# Patient Record
Sex: Female | Born: 1955 | Race: White | Hispanic: No | Smoking: Current every day smoker
Health system: Southern US, Community
[De-identification: ages and names within clinical notes are randomized; demographics above are authoritative.]

## PROBLEM LIST (undated history)

## (undated) DIAGNOSIS — E119 Type 2 diabetes mellitus without complications: Secondary | ICD-10-CM

## (undated) DIAGNOSIS — M899 Disorder of bone, unspecified: Secondary | ICD-10-CM

## (undated) DIAGNOSIS — G939 Disorder of brain, unspecified: Secondary | ICD-10-CM

## (undated) DIAGNOSIS — Q891 Congenital malformations of adrenal gland: Secondary | ICD-10-CM

## (undated) DIAGNOSIS — R12 Heartburn: Secondary | ICD-10-CM

## (undated) DIAGNOSIS — K219 Gastro-esophageal reflux disease without esophagitis: Secondary | ICD-10-CM

## (undated) DIAGNOSIS — C3490 Malignant neoplasm of unspecified part of unspecified bronchus or lung: Secondary | ICD-10-CM

## (undated) HISTORY — DX: Type 2 diabetes mellitus without complications: E11.9

## (undated) HISTORY — PX: TONSILLECTOMY: SUR1361

## (undated) HISTORY — DX: Heartburn: R12

## (undated) HISTORY — PX: ABDOMINAL HYSTERECTOMY: SHX81

## (undated) HISTORY — DX: Gastro-esophageal reflux disease without esophagitis: K21.9

---

## 2005-11-29 ENCOUNTER — Ambulatory Visit: Payer: Self-pay | Admitting: Unknown Physician Specialty

## 2007-06-26 ENCOUNTER — Ambulatory Visit: Payer: Self-pay | Admitting: Unknown Physician Specialty

## 2008-08-12 ENCOUNTER — Ambulatory Visit: Payer: Self-pay | Admitting: Gastroenterology

## 2009-08-12 ENCOUNTER — Ambulatory Visit: Payer: Self-pay | Admitting: General Practice

## 2009-10-04 ENCOUNTER — Ambulatory Visit: Payer: Self-pay | Admitting: Unknown Physician Specialty

## 2011-10-16 ENCOUNTER — Ambulatory Visit: Payer: Self-pay | Admitting: General Practice

## 2012-01-04 ENCOUNTER — Ambulatory Visit: Payer: Self-pay | Admitting: Gastroenterology

## 2012-01-07 LAB — PATHOLOGY REPORT

## 2013-08-26 ENCOUNTER — Encounter: Payer: Self-pay | Admitting: Physician Assistant

## 2013-09-21 ENCOUNTER — Emergency Department: Payer: Self-pay | Admitting: Emergency Medicine

## 2013-09-26 ENCOUNTER — Encounter: Payer: Self-pay | Admitting: Physician Assistant

## 2013-10-27 ENCOUNTER — Encounter: Payer: Self-pay | Admitting: Physician Assistant

## 2013-12-14 ENCOUNTER — Ambulatory Visit: Payer: Self-pay | Admitting: Physician Assistant

## 2015-01-29 ENCOUNTER — Other Ambulatory Visit: Payer: Self-pay | Admitting: Physician Assistant

## 2015-02-02 ENCOUNTER — Other Ambulatory Visit: Payer: Self-pay | Admitting: Emergency Medicine

## 2015-02-02 MED ORDER — OMEPRAZOLE 20 MG PO CPDR: 20.0000 mg | DELAYED_RELEASE_CAPSULE | Freq: Every day | ORAL | Status: DC

## 2015-02-02 NOTE — Telephone Encounter (Signed)
Received a faxed medication request from [facility] in [place].  Please advise.  Thank you.

## 2015-02-02 NOTE — Telephone Encounter (Signed)
Med refill approved 

## 2015-05-10 ENCOUNTER — Ambulatory Visit: Payer: Self-pay | Admitting: Physician Assistant

## 2015-05-10 ENCOUNTER — Encounter: Payer: Self-pay | Admitting: Physician Assistant

## 2015-05-10 VITALS — BP 100/68 | HR 78 | Temp 98.9°F

## 2015-05-10 DIAGNOSIS — J069 Acute upper respiratory infection, unspecified: Secondary | ICD-10-CM

## 2015-05-10 MED ORDER — CEFDINIR 300 MG PO CAPS: 300.0000 mg | ORAL_CAPSULE | Freq: Two times a day (BID) | ORAL | Status: AC

## 2015-05-10 MED ORDER — CEFDINIR 300 MG PO CAPS: 300.0000 mg | ORAL_CAPSULE | Freq: Two times a day (BID) | ORAL | Status: DC

## 2015-05-10 MED ORDER — BENZONATATE 200 MG PO CAPS: 200.0000 mg | ORAL_CAPSULE | Freq: Three times a day (TID) | ORAL | Status: DC | PRN

## 2015-05-10 NOTE — Progress Notes (Signed)
S: C/o runny nose and congestion with congested cough for 10+ days, no fever, chills, cp/sob, v/d; mucus was green this am, cough is sporadic, using delsym during the day, tessalon perls when at home  Using otc meds: robitussin  O: PE: vitals wnl, nad, perrl eomi, normocephalic, tms dull, nasal mucosa red and swollen, throat injected, neck supple no lymph, lungs c t a, cv rrr, neuro intact  A:  Acute  uri   P: omnicef '300mg'$  bid x 10d, tessalon perls, drink fluids, continue regular meds , use otc meds of choice, return if not improving in 5 days, return earlier if worsening

## 2015-09-12 ENCOUNTER — Other Ambulatory Visit: Payer: Self-pay | Admitting: Family

## 2015-09-12 ENCOUNTER — Other Ambulatory Visit: Payer: Self-pay | Admitting: Physician Assistant

## 2015-09-12 MED ORDER — OMEPRAZOLE 20 MG PO CPDR: 20.0000 mg | DELAYED_RELEASE_CAPSULE | Freq: Every day | ORAL | Status: DC

## 2015-09-12 NOTE — Telephone Encounter (Signed)
Needs appointment for labs in office. [place] FNP

## 2015-09-15 ENCOUNTER — Other Ambulatory Visit: Payer: Self-pay | Admitting: Physician Assistant

## 2015-10-12 ENCOUNTER — Other Ambulatory Visit: Payer: Self-pay | Admitting: Family

## 2015-10-21 ENCOUNTER — Other Ambulatory Visit: Payer: Self-pay | Admitting: Physician Assistant

## 2015-10-21 NOTE — Telephone Encounter (Signed)
Med refill approved, discussed with pharmacist and pt is taking '1000mg'$  bid but until last 3 months she had not been taking it correctly, told her we would refill it x 1 but to let the patient know she needs fasting labs prior to next refill

## 2015-10-24 ENCOUNTER — Ambulatory Visit: Payer: Self-pay | Admitting: Physician Assistant

## 2015-10-24 ENCOUNTER — Encounter: Payer: Self-pay | Admitting: Physician Assistant

## 2015-10-24 VITALS — BP 125/100 | HR 80 | Temp 98.6°F

## 2015-10-24 DIAGNOSIS — E119 Type 2 diabetes mellitus without complications: Secondary | ICD-10-CM

## 2015-10-24 MED ORDER — CANAGLIFLOZIN 100 MG PO TABS: 100.0000 mg | ORAL_TABLET | Freq: Every day | ORAL | 3 refills | Status: DC

## 2015-10-24 MED ORDER — FLUCONAZOLE 150 MG PO TABS: ORAL_TABLET | ORAL | 2 refills | Status: DC

## 2015-10-24 NOTE — Progress Notes (Addendum)
S: here for labs, states her glucose has run 300 2 or 3 mornings, has been taking the metformin '1000mg'$  bid, states she finally accepted she has diabetes and has been trying to manage her food and medications better, no other complaints  O: vitals wnl, nad, lungs c t a, cv rrr  A: dmII  P: stop metformin, use invokana '100mg'$  qd, recheck A1C in 2 months, diflucan if needed for yeast, f/u with opth for yearly eye exam  Vit d low, sent rx for vit d to [facility]

## 2015-10-25 LAB — CMP12+LP+TP+TSH+6AC+CBC/D/PLT
ALBUMIN: 4.6 g/dL (ref 3.5–5.5)
ALK PHOS: 128 IU/L — AB (ref 39–117)
ALT: 36 IU/L — AB (ref 0–32)
AST: 21 IU/L (ref 0–40)
Albumin/Globulin Ratio: 2 (ref 1.2–2.2)
BASOS: 0 %
BILIRUBIN TOTAL: 0.3 mg/dL (ref 0.0–1.2)
BUN / CREAT RATIO: 19 (ref 9–23)
BUN: 11 mg/dL (ref 6–24)
Basophils Absolute: 0 10*3/uL (ref 0.0–0.2)
CALCIUM: 9.3 mg/dL (ref 8.7–10.2)
CHLORIDE: 101 mmol/L (ref 96–106)
CHOL/HDL RATIO: 7 ratio — AB (ref 0.0–4.4)
Cholesterol, Total: 230 mg/dL — ABNORMAL HIGH (ref 100–199)
Creatinine, Ser: 0.58 mg/dL (ref 0.57–1.00)
EOS (ABSOLUTE): 0.1 10*3/uL (ref 0.0–0.4)
EOS: 1 %
Estimated CHD Risk: 2 times avg. — ABNORMAL HIGH (ref 0.0–1.0)
FREE THYROXINE INDEX: 2 (ref 1.2–4.9)
GFR calc non Af Amer: 102 mL/min/{1.73_m2} (ref >59)
GFR, EST AFRICAN AMERICAN: 117 mL/min/{1.73_m2} (ref >59)
GGT: 316 IU/L — ABNORMAL HIGH (ref 0–60)
GLOBULIN, TOTAL: 2.3 g/dL (ref 1.5–4.5)
Glucose: 180 mg/dL — ABNORMAL HIGH (ref 65–99)
HDL: 33 mg/dL — ABNORMAL LOW (ref >39)
HEMATOCRIT: 45.2 % (ref 34.0–46.6)
HEMOGLOBIN: 15.1 g/dL (ref 11.1–15.9)
IMMATURE GRANS (ABS): 0 10*3/uL (ref 0.0–0.1)
IMMATURE GRANULOCYTES: 0 %
Iron: 93 ug/dL (ref 27–159)
LDH: 134 IU/L (ref 119–226)
LDL CALC: 150 mg/dL — AB (ref 0–99)
LYMPHS: 26 %
Lymphocytes Absolute: 2.7 10*3/uL (ref 0.7–3.1)
MCH: 29.7 pg (ref 26.6–33.0)
MCHC: 33.4 g/dL (ref 31.5–35.7)
MCV: 89 fL (ref 79–97)
MONOCYTES: 6 %
MONOS ABS: 0.6 10*3/uL (ref 0.1–0.9)
NEUTROS ABS: 6.9 10*3/uL (ref 1.4–7.0)
NEUTROS PCT: 67 %
POTASSIUM: 4.8 mmol/L (ref 3.5–5.2)
Phosphorus: 4 mg/dL (ref 2.5–4.5)
Platelets: 332 10*3/uL (ref 150–379)
RBC: 5.09 x10E6/uL (ref 3.77–5.28)
RDW: 13.9 % (ref 12.3–15.4)
SODIUM: 140 mmol/L (ref 134–144)
T3 Uptake Ratio: 23 % — ABNORMAL LOW (ref 24–39)
T4, Total: 8.8 ug/dL (ref 4.5–12.0)
TRIGLYCERIDES: 236 mg/dL — AB (ref 0–149)
TSH: 2.51 u[IU]/mL (ref 0.450–4.500)
Total Protein: 6.9 g/dL (ref 6.0–8.5)
Uric Acid: 4.4 mg/dL (ref 2.5–7.1)
VLDL CHOLESTEROL CAL: 47 mg/dL — AB (ref 5–40)
WBC: 10.4 10*3/uL (ref 3.4–10.8)

## 2015-10-25 LAB — HGB A1C W/O EAG: Hgb A1c MFr Bld: 8.2 % — ABNORMAL HIGH (ref 4.8–5.6)

## 2015-10-25 LAB — VITAMIN D 25 HYDROXY (VIT D DEFICIENCY, FRACTURES): Vit D, 25-Hydroxy: 23.6 ng/mL — ABNORMAL LOW (ref 30.0–100.0)

## 2015-10-25 MED ORDER — VITAMIN D (ERGOCALCIFEROL) 1.25 MG (50000 UNIT) PO CAPS: 50000.0000 [IU] | ORAL_CAPSULE | ORAL | 4 refills | Status: DC

## 2015-10-25 NOTE — Addendum Note (Signed)
Addended by: Versie Starks on: 10/25/2015 09:30 AM   Modules accepted: Orders

## 2015-10-27 MED ORDER — METFORMIN HCL 1000 MG PO TABS
1000.0000 mg | ORAL_TABLET | Freq: Two times a day (BID) | ORAL | 3 refills | Status: AC
Start: 2015-10-27 — End: ?

## 2015-10-27 MED ORDER — VITAMIN D (ERGOCALCIFEROL) 1.25 MG (50000 UNIT) PO CAPS: 50000.0000 [IU] | ORAL_CAPSULE | ORAL | 4 refills | Status: DC

## 2015-10-27 NOTE — Addendum Note (Signed)
Addended by: Versie Starks on: 10/27/2015 03:46 PM   Modules accepted: Orders

## 2016-01-23 ENCOUNTER — Encounter (INDEPENDENT_AMBULATORY_CARE_PROVIDER_SITE_OTHER): Payer: Self-pay

## 2016-01-23 ENCOUNTER — Other Ambulatory Visit: Payer: Self-pay

## 2016-01-23 DIAGNOSIS — Z299 Encounter for prophylactic measures, unspecified: Secondary | ICD-10-CM

## 2016-01-23 NOTE — Progress Notes (Signed)
Patient came in to have blood drawn for testing per Susan's authorization. 

## 2016-01-24 LAB — CMP12+LP+TP+TSH+6AC+CBC/D/PLT
A/G RATIO: 1.8 (ref 1.2–2.2)
ALT: 19 IU/L (ref 0–32)
AST: 16 IU/L (ref 0–40)
Albumin: 4.4 g/dL (ref 3.5–5.5)
Alkaline Phosphatase: 110 IU/L (ref 39–117)
BASOS ABS: 0.1 10*3/uL (ref 0.0–0.2)
BILIRUBIN TOTAL: 0.2 mg/dL (ref 0.0–1.2)
BUN / CREAT RATIO: 19 (ref 9–23)
BUN: 10 mg/dL (ref 6–24)
Basos: 1 %
CHOL/HDL RATIO: 6.2 ratio — AB (ref 0.0–4.4)
CREATININE: 0.54 mg/dL — AB (ref 0.57–1.00)
Calcium: 9.4 mg/dL (ref 8.7–10.2)
Chloride: 102 mmol/L (ref 96–106)
Cholesterol, Total: 222 mg/dL — ABNORMAL HIGH (ref 100–199)
EOS (ABSOLUTE): 0.1 10*3/uL (ref 0.0–0.4)
EOS: 1 %
Estimated CHD Risk: 1.8 times avg. — ABNORMAL HIGH (ref 0.0–1.0)
Free Thyroxine Index: 2 (ref 1.2–4.9)
GFR calc non Af Amer: 104 mL/min/{1.73_m2} (ref >59)
GFR, EST AFRICAN AMERICAN: 120 mL/min/{1.73_m2} (ref >59)
GGT: 176 IU/L — ABNORMAL HIGH (ref 0–60)
GLUCOSE: 140 mg/dL — AB (ref 65–99)
Globulin, Total: 2.4 g/dL (ref 1.5–4.5)
HDL: 36 mg/dL — AB (ref >39)
HEMOGLOBIN: 15.4 g/dL (ref 11.1–15.9)
Hematocrit: 45.8 % (ref 34.0–46.6)
Immature Grans (Abs): 0 10*3/uL (ref 0.0–0.1)
Immature Granulocytes: 0 %
Iron: 72 ug/dL (ref 27–159)
LDH: 169 IU/L (ref 119–226)
LDL Calculated: 139 mg/dL — ABNORMAL HIGH (ref 0–99)
Lymphocytes Absolute: 3.1 10*3/uL (ref 0.7–3.1)
Lymphs: 27 %
MCH: 30.4 pg (ref 26.6–33.0)
MCHC: 33.6 g/dL (ref 31.5–35.7)
MCV: 90 fL (ref 79–97)
MONOCYTES: 7 %
Monocytes Absolute: 0.8 10*3/uL (ref 0.1–0.9)
NEUTROS ABS: 7.4 10*3/uL — AB (ref 1.4–7.0)
Neutrophils: 64 %
PHOSPHORUS: 3.7 mg/dL (ref 2.5–4.5)
PLATELETS: 324 10*3/uL (ref 150–379)
Potassium: 4.8 mmol/L (ref 3.5–5.2)
RBC: 5.07 x10E6/uL (ref 3.77–5.28)
RDW: 13.8 % (ref 12.3–15.4)
Sodium: 142 mmol/L (ref 134–144)
T3 Uptake Ratio: 24 % (ref 24–39)
T4, Total: 8.4 ug/dL (ref 4.5–12.0)
TSH: 2.41 u[IU]/mL (ref 0.450–4.500)
Total Protein: 6.8 g/dL (ref 6.0–8.5)
Triglycerides: 237 mg/dL — ABNORMAL HIGH (ref 0–149)
URIC ACID: 3.9 mg/dL (ref 2.5–7.1)
VLDL CHOLESTEROL CAL: 47 mg/dL — AB (ref 5–40)
WBC: 11.5 10*3/uL — ABNORMAL HIGH (ref 3.4–10.8)

## 2016-01-24 LAB — HGB A1C W/O EAG: HEMOGLOBIN A1C: 7 % — AB (ref 4.8–5.6)

## 2016-01-30 ENCOUNTER — Encounter: Payer: Self-pay | Admitting: Emergency Medicine

## 2016-01-30 DIAGNOSIS — E119 Type 2 diabetes mellitus without complications: Secondary | ICD-10-CM | POA: Insufficient documentation

## 2016-03-06 ENCOUNTER — Other Ambulatory Visit: Payer: Self-pay

## 2016-03-06 DIAGNOSIS — Z299 Encounter for prophylactic measures, unspecified: Secondary | ICD-10-CM

## 2016-03-06 MED ORDER — CANAGLIFLOZIN 100 MG PO TABS: 100.0000 mg | ORAL_TABLET | Freq: Every day | ORAL | 3 refills | Status: DC

## 2016-03-06 NOTE — Addendum Note (Signed)
Addended by: Versie Starks on: 03/06/2016 04:28 PM   Modules accepted: Orders

## 2016-03-06 NOTE — Progress Notes (Signed)
Patient came in to have blood drawn for testing per Susan's authorization. 

## 2016-03-07 ENCOUNTER — Encounter: Payer: Self-pay | Admitting: Physician Assistant

## 2016-03-07 LAB — COMPREHENSIVE METABOLIC PANEL
A/G RATIO: 2 (ref 1.2–2.2)
ALT: 16 IU/L (ref 0–32)
AST: 9 IU/L (ref 0–40)
Albumin: 4.5 g/dL (ref 3.5–5.5)
Alkaline Phosphatase: 109 IU/L (ref 39–117)
BUN/Creatinine Ratio: 24 — ABNORMAL HIGH (ref 9–23)
BUN: 21 mg/dL (ref 6–24)
CHLORIDE: 101 mmol/L (ref 96–106)
CO2: 23 mmol/L (ref 18–29)
Calcium: 9.5 mg/dL (ref 8.7–10.2)
Creatinine, Ser: 0.88 mg/dL (ref 0.57–1.00)
GFR calc non Af Amer: 73 mL/min/{1.73_m2} (ref >59)
GFR, EST AFRICAN AMERICAN: 84 mL/min/{1.73_m2} (ref >59)
Globulin, Total: 2.2 g/dL (ref 1.5–4.5)
Glucose: 218 mg/dL — ABNORMAL HIGH (ref 65–99)
POTASSIUM: 4.4 mmol/L (ref 3.5–5.2)
Sodium: 142 mmol/L (ref 134–144)
TOTAL PROTEIN: 6.7 g/dL (ref 6.0–8.5)

## 2016-03-07 NOTE — Addendum Note (Signed)
Addended by: Rudene Anda T on: 03/07/2016 02:08 PM   Modules accepted: Orders

## 2016-03-19 ENCOUNTER — Other Ambulatory Visit: Payer: Self-pay

## 2016-04-04 ENCOUNTER — Encounter: Payer: Self-pay | Admitting: Radiology

## 2016-04-04 ENCOUNTER — Ambulatory Visit
Admission: RE | Admit: 2016-04-04 | Discharge: 2016-04-04 | Disposition: A | Discharge status: Home / Self Care | Payer: Managed Care, Other (non HMO) | Source: Ambulatory Visit | Attending: Physician Assistant | Admitting: Physician Assistant

## 2016-04-04 DIAGNOSIS — Z1231 Encounter for screening mammogram for malignant neoplasm of breast: Secondary | ICD-10-CM | POA: Insufficient documentation

## 2016-04-04 DIAGNOSIS — Z299 Encounter for prophylactic measures, unspecified: Secondary | ICD-10-CM | POA: Insufficient documentation

## 2016-04-19 ENCOUNTER — Ambulatory Visit: Payer: Self-pay | Admitting: Physician Assistant

## 2016-04-19 ENCOUNTER — Encounter: Payer: Self-pay | Admitting: Physician Assistant

## 2016-04-19 VITALS — BP 130/70 | HR 88 | Temp 98.2°F | Ht 63.0 in | Wt 194.0 lb

## 2016-04-19 DIAGNOSIS — Z008 Encounter for other general examination: Secondary | ICD-10-CM

## 2016-04-19 DIAGNOSIS — Z0000 Encounter for general adult medical examination without abnormal findings: Secondary | ICD-10-CM

## 2016-04-19 DIAGNOSIS — Z0189 Encounter for other specified special examinations: Secondary | ICD-10-CM

## 2016-04-19 NOTE — Progress Notes (Signed)
S: here for wellness and biometric eval, had a mammogram this year which was normal, gets a colonoscopy every 2 years, hx dm, gerd; +smoker 1ppd for 33y, not ready to quit; married; Fam Hx, mother died at 56 in her sleep, hx of breast CA, dm, htn; F is 71, hx of htn and afib; sister has dm, htn, brother has dm, not sure of his other health problems, concerned as the women on her mother's side usually die of heart disease in their 14s; still has not seen the endocrinologist that we have set an appointment up with several times, did see the eye doctor this year, has no complaints today, ros negative  O: vitals wnl nad, ENT wnl neck supple no lymph noted, lungs c t a, cv rrr, abd soft nontender bs normal all 4 quads, n/v intact, baseline ekg obtained today, nsr  A: wellness encounter, biometric forms, dm, gerd, smoker  P: encouraged smoking cessation, f/u with endocrinologist, once again explained to pt that we do not feel comfortable managing her dm since it has progressed, states she understands and will go to the endocrinologist, forms filled out for biometric

## 2016-04-19 NOTE — Addendum Note (Signed)
Addended by: Versie Starks on: 04/19/2016 03:40 PM   Modules accepted: Orders

## 2016-06-01 ENCOUNTER — Other Ambulatory Visit: Payer: Self-pay | Admitting: Emergency Medicine

## 2016-06-01 MED ORDER — CANAGLIFLOZIN 100 MG PO TABS: 100.0000 mg | ORAL_TABLET | Freq: Every day | ORAL | 0 refills | Status: DC

## 2016-06-04 ENCOUNTER — Other Ambulatory Visit: Payer: Self-pay | Admitting: Physician Assistant

## 2016-06-04 NOTE — Telephone Encounter (Signed)
Med refill for omeprazole approved

## 2016-07-03 ENCOUNTER — Other Ambulatory Visit: Payer: Self-pay | Admitting: Physician Assistant

## 2016-07-12 ENCOUNTER — Encounter: Payer: Self-pay | Admitting: Physician Assistant

## 2016-07-12 ENCOUNTER — Ambulatory Visit: Payer: Self-pay | Admitting: Physician Assistant

## 2016-07-12 VITALS — BP 120/70 | HR 81 | Temp 98.2°F

## 2016-07-12 DIAGNOSIS — N951 Menopausal and female climacteric states: Secondary | ICD-10-CM

## 2016-07-12 DIAGNOSIS — W57XXXA Bitten or stung by nonvenomous insect and other nonvenomous arthropods, initial encounter: Secondary | ICD-10-CM

## 2016-07-12 DIAGNOSIS — M545 Low back pain, unspecified: Secondary | ICD-10-CM

## 2016-07-12 MED ORDER — DOXYCYCLINE HYCLATE 100 MG PO TABS: 100.0000 mg | ORAL_TABLET | Freq: Two times a day (BID) | ORAL | 0 refills | Status: DC

## 2016-07-12 MED ORDER — VENLAFAXINE HCL ER 37.5 MG PO CP24: ORAL_CAPSULE | ORAL | 3 refills | Status: DC

## 2016-07-12 MED ORDER — BACLOFEN 10 MG PO TABS: 10.0000 mg | ORAL_TABLET | Freq: Three times a day (TID) | ORAL | 0 refills | Status: DC

## 2016-07-12 NOTE — Progress Notes (Signed)
S: c/o low back pain, ?if uti, states she has been sitting more than usual, no numbness or tingling, also states she has a tick bite on the back of her leg, can't see it so doesn't know if its red/swollen, no fever/chills, also ?if there is medication other than hormones that will help with hot flashes  O: vitals wnl, nad, spine is nontender, tender at si joints and muscles feel tight, neg slr, full rom, back of r leg has red raised area at site of tick bite with redness surrounding to size of quarter, no pustules or drainage, n/v intact  A: tick bite, acute back pain, hot flashes  P: doxy, baclofen, effexor 37.'5mg'$ 

## 2016-07-13 ENCOUNTER — Other Ambulatory Visit: Payer: Self-pay

## 2016-07-13 DIAGNOSIS — Z299 Encounter for prophylactic measures, unspecified: Secondary | ICD-10-CM

## 2016-07-13 LAB — POCT URINALYSIS DIPSTICK
Bilirubin, UA: NEGATIVE
KETONES UA: NEGATIVE
LEUKOCYTES UA: NEGATIVE
NITRITE UA: NEGATIVE
PROTEIN UA: NEGATIVE
Spec Grav, UA: 1.01 (ref 1.010–1.025)
Urobilinogen, UA: 0.2 E.U./dL
pH, UA: 5.5 (ref 5.0–8.0)

## 2016-07-13 NOTE — Addendum Note (Signed)
Addended by: Rudene Anda T on: 07/13/2016 01:13 PM   Modules accepted: Orders

## 2016-07-13 NOTE — Progress Notes (Signed)
Patient came in to have blood drawn for testing per Susan's authorization. 

## 2016-07-14 LAB — COMPREHENSIVE METABOLIC PANEL
ALBUMIN: 4.6 g/dL (ref 3.5–5.5)
ALK PHOS: 103 IU/L (ref 39–117)
ALT: 16 IU/L (ref 0–32)
AST: 14 IU/L (ref 0–40)
Albumin/Globulin Ratio: 2.1 (ref 1.2–2.2)
BUN / CREAT RATIO: 24 — AB (ref 9–23)
BUN: 14 mg/dL (ref 6–24)
Bilirubin Total: 0.3 mg/dL (ref 0.0–1.2)
CO2: 21 mmol/L (ref 18–29)
CREATININE: 0.58 mg/dL (ref 0.57–1.00)
Calcium: 9.6 mg/dL (ref 8.7–10.2)
Chloride: 98 mmol/L (ref 96–106)
GFR calc Af Amer: 117 mL/min/{1.73_m2} (ref >59)
GFR calc non Af Amer: 102 mL/min/{1.73_m2} (ref >59)
GLUCOSE: 143 mg/dL — AB (ref 65–99)
Globulin, Total: 2.2 g/dL (ref 1.5–4.5)
Potassium: 4.7 mmol/L (ref 3.5–5.2)
Sodium: 138 mmol/L (ref 134–144)
Total Protein: 6.8 g/dL (ref 6.0–8.5)

## 2016-07-14 LAB — HGB A1C W/O EAG: Hgb A1c MFr Bld: 7.5 % — ABNORMAL HIGH (ref 4.8–5.6)

## 2016-07-19 ENCOUNTER — Other Ambulatory Visit: Payer: Self-pay | Admitting: Physician Assistant

## 2016-07-27 ENCOUNTER — Other Ambulatory Visit: Payer: Self-pay | Admitting: Physician Assistant

## 2016-07-27 NOTE — Telephone Encounter (Signed)
One refill of invokana approved, pt has not been called by [facility] for appt, rma called their office and they said dr Eddie Dibbles was not taking new patients so she would have to see dr Gabriel Carina, they will call her with an appointment time either today or Monday; pt has previously cancelled many appointments with specialists, I will approve only 1 refill of invokana bc I feel its vital for her to see an endocrinologist due to the worsening diabetes

## 2016-08-23 ENCOUNTER — Other Ambulatory Visit: Payer: Self-pay | Admitting: Emergency Medicine

## 2016-08-23 ENCOUNTER — Other Ambulatory Visit: Payer: Self-pay | Admitting: Physician Assistant

## 2016-08-23 MED ORDER — CANAGLIFLOZIN 100 MG PO TABS: ORAL_TABLET | ORAL | 1 refills | Status: DC

## 2016-08-23 NOTE — Telephone Encounter (Signed)
This will be the last refill of invokana for this patient.  She has been told numerous times to see an endocrinologist.  She has an appt on Aug 6 and we told her we expect her to continue to see the endocrinologist and get her medications from them

## 2016-09-06 ENCOUNTER — Other Ambulatory Visit: Payer: Self-pay | Admitting: Emergency Medicine

## 2016-09-06 MED ORDER — FLUCONAZOLE 150 MG PO TABS: ORAL_TABLET | ORAL | 2 refills | Status: DC

## 2016-09-06 NOTE — Telephone Encounter (Signed)
Pt having yeast due to invokana, ?if could get rx for diflucan, sent to [facility]

## 2016-10-12 ENCOUNTER — Telehealth: Payer: Self-pay | Admitting: Emergency Medicine

## 2016-10-12 NOTE — Telephone Encounter (Signed)
Patient called and requested that I send a copy of her medical records sent to Dr. Janene Leonard at [facility].  I informed her that I would need her to sign a release form for me to send the records.  I emailed her a release form and she faxed a signed copy back. Records were sent and confirmed receipt.  Patient also expressed that she needs to get a refill on her Invokana until her appointment with Dr. Janene Leonard.  I informed Sandra Leonard and she expressed that she is going to deny the request.  She has told the patient numerous times dating back almost a year to follow-up with an Endocrinologist to care for her Diabetes because her HgA1C numbers were too high to manage in our clinic.  Numerous appointments were made for the patient to see an Endocrinologist and the patient had cancelled the appointments. I attempted to call the patient but received no answer.  An secured e-mail was sent to the patient.

## 2016-11-16 ENCOUNTER — Encounter: Payer: Self-pay | Admitting: Physician Assistant

## 2016-11-16 ENCOUNTER — Ambulatory Visit: Payer: Self-pay | Admitting: Physician Assistant

## 2016-11-16 VITALS — BP 107/65 | HR 87 | Temp 98.7°F | Resp 16

## 2016-11-16 DIAGNOSIS — J209 Acute bronchitis, unspecified: Secondary | ICD-10-CM

## 2016-11-16 MED ORDER — IPRATROPIUM-ALBUTEROL 0.5-2.5 (3) MG/3ML IN SOLN
3.0000 mL | Freq: Once | RESPIRATORY_TRACT | Status: AC
  Administered 2016-11-16: 3 mL via RESPIRATORY_TRACT

## 2016-11-16 MED ORDER — LEVOFLOXACIN 500 MG PO TABS: 500.0000 mg | ORAL_TABLET | Freq: Every day | ORAL | 0 refills | Status: DC

## 2016-11-16 MED ORDER — ALBUTEROL SULFATE HFA 108 (90 BASE) MCG/ACT IN AERS: 2.0000 | INHALATION_SPRAY | Freq: Four times a day (QID) | RESPIRATORY_TRACT | 0 refills | Status: DC | PRN

## 2016-11-16 NOTE — Progress Notes (Signed)
S: C/o cough and congestion with wheezing and chest pain, chest is sore from coughing, denies fever, chills, states mucus is yellow,  But cough is mostly dry and hacking; keeping pt awake at night;  denies cardiac type chest pain or sob, v/d, abd pain Remainder ros neg  O: vitals wnl, nad, tms clear, throat injected, neck supple no lymph, lungs with wheezing/rhonchi in lower lungs, cv rrr, neuro intact, svn duoneb given, pt has better air movement after svn  A:  Acute bronchitis   P:  rx medication:  Levaquin, albuterol; smoking cessation, use otc meds, tylenol or motrin as needed for fever/chills, return if not better in 3 -5 days, return earlier if worsening

## 2016-11-20 DIAGNOSIS — Z720 Tobacco use: Secondary | ICD-10-CM | POA: Insufficient documentation

## 2016-11-20 DIAGNOSIS — K219 Gastro-esophageal reflux disease without esophagitis: Secondary | ICD-10-CM | POA: Insufficient documentation

## 2017-01-16 ENCOUNTER — Encounter: Payer: Self-pay | Admitting: Physician Assistant

## 2017-01-16 ENCOUNTER — Ambulatory Visit: Payer: Self-pay | Admitting: Physician Assistant

## 2017-01-16 VITALS — BP 120/70 | HR 100 | Temp 98.5°F | Resp 16

## 2017-01-16 DIAGNOSIS — L659 Nonscarring hair loss, unspecified: Secondary | ICD-10-CM

## 2017-01-16 DIAGNOSIS — J0100 Acute maxillary sinusitis, unspecified: Secondary | ICD-10-CM

## 2017-01-16 MED ORDER — DOXYCYCLINE HYCLATE 100 MG PO TABS: 100.0000 mg | ORAL_TABLET | Freq: Two times a day (BID) | ORAL | 0 refills | Status: DC

## 2017-01-16 MED ORDER — FLUCONAZOLE 150 MG PO TABS: ORAL_TABLET | ORAL | 2 refills | Status: DC

## 2017-01-16 MED ORDER — PREDNISONE 10 MG PO TABS: 30.0000 mg | ORAL_TABLET | Freq: Every day | ORAL | 0 refills | Status: DC

## 2017-01-16 NOTE — Progress Notes (Signed)
S: Complains of sinus congestion, pressure across her face, cervical glasses to sit on the upper maxillary area, denies fever or chills denies cough, denies chest pain or shortness of breath, also states that she's been having some hair loss and is worried about her thyroid level as her sister has thyroid problems  O: Vitals are normal, TMs are clear, nasal mucosa is grossly red and swollen, maxillary sinuses are tender to palpation, throat is normal, neck is supple, no lymphadenopathy noted, lungs are clear to auscultation, heart sounds are normal, patient still has good amount of hair  A: Acute sinusitis, hair loss  P: Doxy 100 mg twice a day for 10 days, prednisone 30 mg daily for 3 days, estrogen level and thyroid panel ordered

## 2017-01-19 LAB — THYROID PANEL WITH TSH
Free Thyroxine Index: 2.1 (ref 1.2–4.9)
T3 UPTAKE RATIO: 24 % (ref 24–39)
T4, Total: 8.7 ug/dL (ref 4.5–12.0)
TSH: 2.25 u[IU]/mL (ref 0.450–4.500)

## 2017-01-19 LAB — ESTROGENS, TOTAL: ESTROGEN: 151 pg/mL

## 2017-01-23 ENCOUNTER — Other Ambulatory Visit: Payer: Self-pay | Admitting: Physician Assistant

## 2017-01-28 MED ORDER — CANAGLIFLOZIN 100 MG PO TABS: 100.0000 mg | ORAL_TABLET | Freq: Every day | ORAL | 0 refills | Status: DC

## 2017-02-28 ENCOUNTER — Other Ambulatory Visit: Payer: Self-pay

## 2017-02-28 DIAGNOSIS — Z299 Encounter for prophylactic measures, unspecified: Secondary | ICD-10-CM

## 2017-02-28 NOTE — Progress Notes (Signed)
Patient came in to have blood drawn for testing per Dr. Alvan Dame orders.

## 2017-03-01 LAB — CMP12+LP+TP+TSH+6AC+CBC/D/PLT
A/G RATIO: 2 (ref 1.2–2.2)
ALBUMIN: 4.7 g/dL (ref 3.5–5.5)
ALT: 14 IU/L (ref 0–32)
AST: 13 IU/L (ref 0–40)
Alkaline Phosphatase: 112 IU/L (ref 39–117)
BASOS ABS: 0.1 10*3/uL (ref 0.0–0.2)
BUN/Creatinine Ratio: 35 — ABNORMAL HIGH (ref 9–23)
BUN: 18 mg/dL (ref 6–24)
Basos: 0 %
Bilirubin Total: 0.2 mg/dL (ref 0.0–1.2)
CHOLESTEROL TOTAL: 225 mg/dL — AB (ref 100–199)
Calcium: 10 mg/dL (ref 8.7–10.2)
Chloride: 98 mmol/L (ref 96–106)
Chol/HDL Ratio: 6.6 ratio — ABNORMAL HIGH (ref 0.0–4.4)
Creatinine, Ser: 0.51 mg/dL — ABNORMAL LOW (ref 0.57–1.00)
EOS (ABSOLUTE): 0.1 10*3/uL (ref 0.0–0.4)
Eos: 1 %
Estimated CHD Risk: 1.9 times avg. — ABNORMAL HIGH (ref 0.0–1.0)
Free Thyroxine Index: 2.3 (ref 1.2–4.9)
GFR, EST AFRICAN AMERICAN: 122 mL/min/{1.73_m2} (ref >59)
GFR, EST NON AFRICAN AMERICAN: 106 mL/min/{1.73_m2} (ref >59)
GGT: 145 IU/L — ABNORMAL HIGH (ref 0–60)
GLOBULIN, TOTAL: 2.4 g/dL (ref 1.5–4.5)
Glucose: 123 mg/dL — ABNORMAL HIGH (ref 65–99)
HDL: 34 mg/dL — AB (ref >39)
Hematocrit: 47.7 % — ABNORMAL HIGH (ref 34.0–46.6)
Hemoglobin: 16.3 g/dL — ABNORMAL HIGH (ref 11.1–15.9)
IRON: 86 ug/dL (ref 27–159)
Immature Grans (Abs): 0 10*3/uL (ref 0.0–0.1)
Immature Granulocytes: 0 %
LDH: 156 IU/L (ref 119–226)
LDL Calculated: 139 mg/dL — ABNORMAL HIGH (ref 0–99)
Lymphocytes Absolute: 3.5 10*3/uL — ABNORMAL HIGH (ref 0.7–3.1)
Lymphs: 28 %
MCH: 31 pg (ref 26.6–33.0)
MCHC: 34.2 g/dL (ref 31.5–35.7)
MCV: 91 fL (ref 79–97)
MONOCYTES: 8 %
Monocytes Absolute: 1 10*3/uL — ABNORMAL HIGH (ref 0.1–0.9)
Neutrophils Absolute: 7.9 10*3/uL — ABNORMAL HIGH (ref 1.4–7.0)
Neutrophils: 63 %
PHOSPHORUS: 3.7 mg/dL (ref 2.5–4.5)
PLATELETS: 337 10*3/uL (ref 150–379)
Potassium: 4.9 mmol/L (ref 3.5–5.2)
RBC: 5.25 x10E6/uL (ref 3.77–5.28)
RDW: 13.7 % (ref 12.3–15.4)
Sodium: 137 mmol/L (ref 134–144)
T3 UPTAKE RATIO: 25 % (ref 24–39)
T4 TOTAL: 9 ug/dL (ref 4.5–12.0)
TOTAL PROTEIN: 7.1 g/dL (ref 6.0–8.5)
TRIGLYCERIDES: 262 mg/dL — AB (ref 0–149)
TSH: 2.47 u[IU]/mL (ref 0.450–4.500)
Uric Acid: 3.8 mg/dL (ref 2.5–7.1)
VLDL Cholesterol Cal: 52 mg/dL — ABNORMAL HIGH (ref 5–40)
WBC: 12.6 10*3/uL — AB (ref 3.4–10.8)

## 2017-03-01 LAB — HEPATITIS C ANTIBODY (REFLEX): HCV Ab: <0.1 s/co ratio (ref 0.0–0.9)

## 2017-03-01 LAB — HCV COMMENT:

## 2017-03-06 LAB — SPECIMEN STATUS REPORT

## 2017-03-06 LAB — HGB A1C W/O EAG: HEMOGLOBIN A1C: 6.8 % — AB (ref 4.8–5.6)

## 2017-04-10 ENCOUNTER — Ambulatory Visit: Payer: Self-pay | Admitting: Physician Assistant

## 2017-04-10 ENCOUNTER — Encounter: Payer: Self-pay | Admitting: Physician Assistant

## 2017-04-10 VITALS — BP 153/96 | HR 105 | Temp 98.0°F

## 2017-04-10 DIAGNOSIS — J0120 Acute ethmoidal sinusitis, unspecified: Secondary | ICD-10-CM

## 2017-04-10 MED ORDER — BENZONATATE 200 MG PO CAPS: 200.0000 mg | ORAL_CAPSULE | Freq: Three times a day (TID) | ORAL | 0 refills | Status: DC | PRN

## 2017-04-10 MED ORDER — CLARITHROMYCIN 500 MG PO TABS: 500.0000 mg | ORAL_TABLET | Freq: Two times a day (BID) | ORAL | 0 refills | Status: DC

## 2017-04-10 MED ORDER — FEXOFENADINE-PSEUDOEPHED ER 60-120 MG PO TB12: 1.0000 | ORAL_TABLET | Freq: Two times a day (BID) | ORAL | 0 refills | Status: DC

## 2017-04-10 NOTE — Progress Notes (Signed)
   Subjective: Sinus congestion    Patient ID: Sandra Leonard, female    DOB: September 10, 1955, 62 y.o.   MRN: [id number]  HPI Patient complaining 1 week of frontal headache, nasal congestion, right ear pressure, and cough with deep inspirations.  Patient denies nausea, vomiting, diarrhea.  Patient is taking flu shot for this season.  Patient stated no relief over-the-counter cough preparations.   Review of Systems    Diabetes Objective:   Physical Exam HEENT remarkable bilateral maxillary guarding.  Edematous nasal turbinates with thick rhinorrhea.  Postnasal drainage.  Neck is supple without adenopathy.  Lungs are clear to auscultation.  Heart is regular rate and rhythm.       Assessment & Plan: Sinusitis  Patient given a prescription for Biaxin, Allegra-D, and Tessalon Perles.  Patient advised to follow-up PCP if no improvement in 1 week.

## 2017-06-18 ENCOUNTER — Ambulatory Visit: Payer: Self-pay | Admitting: Family Medicine

## 2017-06-18 VITALS — BP 102/63 | HR 83 | Temp 98.6°F | Resp 18 | Ht 63.0 in | Wt 187.0 lb

## 2017-06-18 DIAGNOSIS — Z008 Encounter for other general examination: Secondary | ICD-10-CM

## 2017-06-18 DIAGNOSIS — Z0189 Encounter for other specified special examinations: Principal | ICD-10-CM

## 2017-06-18 LAB — GLUCOSE, POCT (MANUAL RESULT ENTRY): POC GLUCOSE: 144 mg/dL — AB (ref 70–99)

## 2017-06-18 NOTE — Progress Notes (Signed)
Subjective: Annual biometrics screening  Patient presents for her annual biometric screening. Patient has a history of hyperlipidemia and type 2 diabetes.  Patient reports improved diet over the last year and 20 pound weight loss.  Patient reports that she walks and gardens regularly for exercise.  Patient regularly sees her primary care provider at [facility] primary [facility]. PCP: Dr. Wallie Char? Patient denies any other issues or concerns.   Review of Systems Unremarkable  Objective  Physical Exam General: Awake, alert and oriented. No acute distress. Well developed, hydrated and nourished. Appears stated age.  HEENT: Supple neck without adenopathy. Sclera is non-icteric. The ear canal is clear without discharge. The tympanic membrane is normal in appearance with normal landmarks and cone of light. Nasal mucosa is pink and moist. Oral mucosa is pink and moist. The pharynx is normal in appearance without tonsillar swelling or exudates.  Skin: Skin in warm, dry and intact without rashes or lesions. Appropriate color for ethnicity. Cardiac: Heart rate and rhythm are normal. No murmurs, gallops, or rubs are auscultated.  Respiratory: The chest wall is symmetric and without deformity. No signs of respiratory distress. Lung sounds are clear in all lobes bilaterally without rales, ronchi, or wheezes.  Neurological: The patient is awake, alert and oriented to person, place, and time with normal speech.  Memory is normal and thought processes intact. No gait abnormalities are appreciated.  Psychiatric: Appropriate mood and affect.   Assessment Annual biometrics screening  Plan  Lipid panel pending. Encouraged routine visits with primary care provider.  Fasting blood sugar is 144 today.  History of type 2 diabetes.  Patient reports compliance with medications.  Advised patient to follow-up with primary care provider regarding this.

## 2017-06-19 LAB — LIPID PANEL
CHOLESTEROL TOTAL: 175 mg/dL (ref 100–199)
Chol/HDL Ratio: 5 ratio — ABNORMAL HIGH (ref 0.0–4.4)
HDL: 35 mg/dL — ABNORMAL LOW (ref >39)
LDL Calculated: 109 mg/dL — ABNORMAL HIGH (ref 0–99)
TRIGLYCERIDES: 155 mg/dL — AB (ref 0–149)
VLDL Cholesterol Cal: 31 mg/dL (ref 5–40)

## 2017-06-19 NOTE — Progress Notes (Signed)
Dear Sandra Leonard, I wanted to let you know that your lipid panel came back.  Your total cholesterol and VLDL cholesterol are normal. Your triglyceride level is 155, normal values are between 0 and 149. Your HDL cholesterol ("good cholesterol") is 35, normal values are above 39.  Your LDL cholesterol ("bad cholesterol") is 109, normal values are below 99.  Your cholesterol/HDL ratio is 5.0, normal values are between 0 and 4.4 for women or 0 and 5 from men.  These abnormal results increase your risk for cardiovascular disease but these values have improved from last year.  I wanted you to be aware of these results so that you can discuss this with your primary care provider at your next visit.

## 2017-06-24 ENCOUNTER — Encounter: Payer: Self-pay | Admitting: Family Medicine

## 2017-07-11 DIAGNOSIS — K635 Polyp of colon: Secondary | ICD-10-CM | POA: Insufficient documentation

## 2017-07-11 DIAGNOSIS — E782 Mixed hyperlipidemia: Secondary | ICD-10-CM | POA: Insufficient documentation

## 2017-10-09 ENCOUNTER — Other Ambulatory Visit: Payer: Self-pay

## 2017-10-09 DIAGNOSIS — E782 Mixed hyperlipidemia: Secondary | ICD-10-CM

## 2017-10-09 DIAGNOSIS — E119 Type 2 diabetes mellitus without complications: Secondary | ICD-10-CM

## 2017-10-09 DIAGNOSIS — E559 Vitamin D deficiency, unspecified: Secondary | ICD-10-CM

## 2017-10-10 LAB — COMPREHENSIVE METABOLIC PANEL
ALBUMIN: 4.2 g/dL (ref 3.6–4.8)
ALK PHOS: 110 IU/L (ref 39–117)
ALT: 20 IU/L (ref 0–32)
AST: 24 IU/L (ref 0–40)
Albumin/Globulin Ratio: 1.7 (ref 1.2–2.2)
BUN/Creatinine Ratio: 25 (ref 12–28)
BUN: 15 mg/dL (ref 8–27)
Bilirubin Total: 0.3 mg/dL (ref 0.0–1.2)
CALCIUM: 9.3 mg/dL (ref 8.7–10.3)
CO2: 20 mmol/L (ref 20–29)
CREATININE: 0.6 mg/dL (ref 0.57–1.00)
Chloride: 103 mmol/L (ref 96–106)
GFR calc Af Amer: 115 mL/min/{1.73_m2} (ref >59)
GFR, EST NON AFRICAN AMERICAN: 99 mL/min/{1.73_m2} (ref >59)
GLUCOSE: 119 mg/dL — AB (ref 65–99)
Globulin, Total: 2.5 g/dL (ref 1.5–4.5)
Potassium: 5 mmol/L (ref 3.5–5.2)
Sodium: 143 mmol/L (ref 134–144)
Total Protein: 6.7 g/dL (ref 6.0–8.5)

## 2017-10-10 LAB — VITAMIN D 25 HYDROXY (VIT D DEFICIENCY, FRACTURES): Vit D, 25-Hydroxy: 22.2 ng/mL — ABNORMAL LOW (ref 30.0–100.0)

## 2017-10-10 LAB — LIPID PANEL WITH LDL/HDL RATIO
CHOLESTEROL TOTAL: 162 mg/dL (ref 100–199)
HDL: 32 mg/dL — AB (ref >39)
LDL Calculated: 80 mg/dL (ref 0–99)
LDL/HDL RATIO: 2.5 ratio (ref 0.0–3.2)
TRIGLYCERIDES: 248 mg/dL — AB (ref 0–149)
VLDL CHOLESTEROL CAL: 50 mg/dL — AB (ref 5–40)

## 2017-12-02 ENCOUNTER — Other Ambulatory Visit: Payer: Self-pay | Admitting: Pediatrics

## 2017-12-02 DIAGNOSIS — Z1231 Encounter for screening mammogram for malignant neoplasm of breast: Secondary | ICD-10-CM

## 2017-12-11 ENCOUNTER — Ambulatory Visit
Admission: RE | Admit: 2017-12-11 | Discharge: 2017-12-11 | Disposition: A | Discharge status: Home / Self Care | Payer: Managed Care, Other (non HMO) | Source: Ambulatory Visit | Attending: Pediatrics | Admitting: Pediatrics

## 2017-12-11 DIAGNOSIS — Z1231 Encounter for screening mammogram for malignant neoplasm of breast: Secondary | ICD-10-CM

## 2019-01-20 ENCOUNTER — Other Ambulatory Visit: Payer: Self-pay

## 2019-01-20 ENCOUNTER — Ambulatory Visit (LOCAL_COMMUNITY_HEALTH_CENTER): Payer: Self-pay

## 2019-01-20 DIAGNOSIS — Z23 Encounter for immunization: Secondary | ICD-10-CM

## 2019-05-05 DIAGNOSIS — E663 Overweight: Secondary | ICD-10-CM | POA: Insufficient documentation

## 2019-07-29 DIAGNOSIS — R202 Paresthesia of skin: Secondary | ICD-10-CM | POA: Insufficient documentation

## 2019-08-27 ENCOUNTER — Other Ambulatory Visit: Payer: Self-pay

## 2019-08-27 ENCOUNTER — Other Ambulatory Visit: Payer: Self-pay | Admitting: Acute Care

## 2019-08-27 ENCOUNTER — Encounter: Payer: Self-pay | Admitting: *Deleted

## 2019-08-27 ENCOUNTER — Ambulatory Visit
Admission: RE | Admit: 2019-08-27 | Discharge: 2019-08-27 | Disposition: A | Discharge status: Home / Self Care | Payer: Managed Care, Other (non HMO) | Source: Ambulatory Visit | Attending: Acute Care | Admitting: Acute Care

## 2019-08-27 DIAGNOSIS — C7931 Secondary malignant neoplasm of brain: Secondary | ICD-10-CM | POA: Diagnosis present

## 2019-08-27 MED ORDER — IOHEXOL 300 MG/ML  SOLN
80.0000 mL | Freq: Once | INTRAMUSCULAR | Status: AC | PRN
  Administered 2019-08-27: 85 mL via INTRAVENOUS

## 2019-08-27 NOTE — Progress Notes (Signed)
  Oncology Nurse Navigator Documentation  Navigator Location: [facility] (08/27/19 1700) Referral Date to RadOnc/MedOnc: 08/27/19 (08/27/19 1700) )Navigator Encounter Type: Introductory Phone Call (08/27/19 1700)   Abnormal Finding Date: 08/26/19 (08/27/19 1700)                   Treatment Phase: Abnormal Scans (08/27/19 1700) Barriers/Navigation Needs: Coordination of Care (08/27/19 1700)   Interventions: Coordination of Care (08/27/19 1700)   Coordination of Care: Appts (08/27/19 1700)         phone call made to patient to introduce to navigator services and review upcoming appts. All questions answered during call. Contact info given and instructed to call with any further questions or needs. Pt verbalized understanding. Nothing further needed at this time.         Time Spent with Patient: 30 (08/27/19 1700)

## 2019-08-28 ENCOUNTER — Inpatient Hospital Stay: Payer: Managed Care, Other (non HMO) | Attending: Oncology | Admitting: Oncology

## 2019-08-28 ENCOUNTER — Encounter: Payer: Self-pay | Admitting: Oncology

## 2019-08-28 ENCOUNTER — Inpatient Hospital Stay: Payer: Managed Care, Other (non HMO)

## 2019-08-28 VITALS — BP 103/76 | HR 86 | Temp 97.8°F | Wt 154.6 lb

## 2019-08-28 DIAGNOSIS — K219 Gastro-esophageal reflux disease without esophagitis: Secondary | ICD-10-CM | POA: Insufficient documentation

## 2019-08-28 DIAGNOSIS — Z7984 Long term (current) use of oral hypoglycemic drugs: Secondary | ICD-10-CM | POA: Insufficient documentation

## 2019-08-28 DIAGNOSIS — K760 Fatty (change of) liver, not elsewhere classified: Secondary | ICD-10-CM | POA: Diagnosis not present

## 2019-08-28 DIAGNOSIS — F17210 Nicotine dependence, cigarettes, uncomplicated: Secondary | ICD-10-CM | POA: Diagnosis not present

## 2019-08-28 DIAGNOSIS — Z79899 Other long term (current) drug therapy: Secondary | ICD-10-CM | POA: Diagnosis not present

## 2019-08-28 DIAGNOSIS — C7931 Secondary malignant neoplasm of brain: Secondary | ICD-10-CM | POA: Diagnosis not present

## 2019-08-28 DIAGNOSIS — E119 Type 2 diabetes mellitus without complications: Secondary | ICD-10-CM | POA: Diagnosis not present

## 2019-08-28 DIAGNOSIS — C3411 Malignant neoplasm of upper lobe, right bronchus or lung: Secondary | ICD-10-CM | POA: Insufficient documentation

## 2019-08-28 DIAGNOSIS — I700 Atherosclerosis of aorta: Secondary | ICD-10-CM | POA: Diagnosis not present

## 2019-08-28 DIAGNOSIS — R200 Anesthesia of skin: Secondary | ICD-10-CM | POA: Diagnosis not present

## 2019-08-28 DIAGNOSIS — R918 Other nonspecific abnormal finding of lung field: Secondary | ICD-10-CM

## 2019-08-29 NOTE — Progress Notes (Signed)
[place]  [phone] [phone]  ID: [id number] GRIMLEY OB: January 16, 1956  MR#: [id number]  AOZ#:[id number]  Patient Care Team: Patient, No Pcp Per as PCP - General (General Practice) Telford Nab, RN as Oncology Nurse Navigator Grayland Ormond, Kathlene November, MD as Consulting Physician (Oncology)  CHIEF COMPLAINT: 3 cm right upper lobe lung lesion with brain metastasis.  INTERVAL HISTORY: Patient is a 64 year old female who initially presented with left-sided facial numbness.  She is anxious, but otherwise feels well.  She has no other neurologic complaints.  She denies any recent fevers or illnesses.  She has a good appetite and denies weight loss.  She has no chest pain, shortness of breath, cough, or hemoptysis.  She denies any nausea, vomiting, constipation, or diarrhea.  She has no melena or hematochezia.  She has no urinary complaints.  Patient otherwise feels well and offers no further specific complaints today.  REVIEW OF SYSTEMS:   Review of Systems  Constitutional: Negative.  Negative for fever, malaise/fatigue and weight loss.  Respiratory: Negative.  Negative for cough, hemoptysis and shortness of breath.   Cardiovascular: Negative.  Negative for chest pain and leg swelling.  Gastrointestinal: Negative.  Negative for abdominal pain.  Genitourinary: Negative.  Negative for dysuria.  Musculoskeletal: Negative.  Negative for back pain.  Skin: Negative.  Negative for rash.  Neurological: Positive for tingling. Negative for dizziness, speech change, focal weakness, weakness and headaches.  Psychiatric/Behavioral: The patient is nervous/anxious.     As per HPI. Otherwise, a complete review of systems is negative.  PAST MEDICAL HISTORY: Past Medical History:  Diagnosis Date  . Diabetes mellitus without complication ([place])   . GERD (gastroesophageal reflux disease)     PAST SURGICAL HISTORY: Past Surgical History:  Procedure Laterality Date  .  ABDOMINAL HYSTERECTOMY      FAMILY HISTORY: Family History  Problem Relation Age of Onset  . Breast cancer Mother 68  . Diabetes Mother   . Hypertension Mother   . Hypertension Father   . Diabetes Sister   . Hypertension Sister   . Diabetes Brother     ADVANCED DIRECTIVES (Y/N):  N  HEALTH MAINTENANCE: Social History   Tobacco Use  . Smoking status: Current Every Day Smoker    Packs/day: 1.00    Years: 33.00    Pack years: 33.00  . Smokeless tobacco: Never Used  Vaping Use  . Vaping Use: Never used  Substance Use Topics  . Alcohol use: No    Alcohol/week: 0.0 standard drinks  . Drug use: No     Colonoscopy:  PAP:  Bone density:  Lipid panel:  Allergies  Allergen Reactions  . Codeine   . Penicillins     Current Outpatient Medications  Medication Sig Dispense Refill  . atorvastatin (LIPITOR) 10 MG tablet Take 10 mg by mouth daily.    Marland Kitchen dexamethasone (DECADRON) 4 MG tablet Take 4 mg by mouth in the morning and at bedtime.    . diclofenac (VOLTAREN) 75 MG EC tablet Take 75 mg by mouth in the morning and at bedtime.    . diphenhydrAMINE HCl (BENADRYL ALLERGY PO) Take 1 tablet by mouth at bedtime.    . empagliflozin (JARDIANCE) 10 MG TABS tablet Take 10 mg by mouth in the morning and at bedtime.    Marland Kitchen ibuprofen (ADVIL) 200 MG tablet Take 200 mg by mouth as needed.    Marland Kitchen lisinopril (ZESTRIL) 5 MG tablet Take 5 mg by mouth daily.    Marland Kitchen  metFORMIN (GLUCOPHAGE) 1000 MG tablet Take 1 tablet (1,000 mg total) by mouth 2 (two) times daily with a meal. 180 tablet 3  . omeprazole (PRILOSEC) 20 MG capsule TAKE 1 CAPSULE (20 MG TOTAL) BY MOUTH DAILY. 30 capsule 6  . ONE TOUCH ULTRA TEST test strip TEST BLOOD SUGAR IN THE MORNING AND 2 HOURS AFTER LARGEST MEAL 50 each 2   No current facility-administered medications for this visit.    OBJECTIVE: Vitals:   08/28/19 0838  BP: 103/76  Pulse: 86  Temp: 97.8 F (36.6 C)  SpO2: 100%     Body mass index is 27.39 kg/m.    ECOG  FS:0 - Asymptomatic  General: Well-developed, well-nourished, no acute distress. Eyes: Pink conjunctiva, anicteric sclera. HEENT: Normocephalic, moist mucous membranes. Lungs: No audible wheezing or coughing. Heart: Regular rate and rhythm. Abdomen: Soft, nontender, no obvious distention. Musculoskeletal: No edema, cyanosis, or clubbing. Neuro: Alert, answering all questions appropriately. Cranial nerves grossly intact. Skin: No rashes or petechiae noted. Psych: Normal affect. Lymphatics: No cervical, calvicular, axillary or inguinal LAD.   LAB RESULTS:  Lab Results  Component Value Date   NA 143 10/09/2017   K 5.0 10/09/2017   CL 103 10/09/2017   CO2 20 10/09/2017   GLUCOSE 119 (H) 10/09/2017   BUN 15 10/09/2017   CREATININE 0.60 10/09/2017   CALCIUM 9.3 10/09/2017   PROT 6.7 10/09/2017   ALBUMIN 4.2 10/09/2017   AST 24 10/09/2017   ALT 20 10/09/2017   ALKPHOS 110 10/09/2017   BILITOT 0.3 10/09/2017   GFRNONAA 99 10/09/2017   GFRAA 115 10/09/2017    Lab Results  Component Value Date   WBC 12.6 (H) 02/28/2017   NEUTROABS 7.9 (H) 02/28/2017   HGB 16.3 (H) 02/28/2017   HCT 47.7 (H) 02/28/2017   MCV 91 02/28/2017   PLT 337 02/28/2017     STUDIES: CT CHEST W CONTRAST  Result Date: 08/27/2019 CLINICAL DATA:  History of metastatic carcinoma to the brain on recent CT of the head. Check for primary EXAM: CT CHEST, ABDOMEN, AND PELVIS WITH CONTRAST TECHNIQUE: Multidetector CT imaging of the chest, abdomen and pelvis was performed following the standard protocol during bolus administration of intravenous contrast. CONTRAST:  47mL OMNIPAQUE IOHEXOL 300 MG/ML  SOLN COMPARISON:  None. FINDINGS: CT CHEST FINDINGS Cardiovascular: Thoracic aorta and its branches demonstrate atherosclerotic calcifications without aneurysmal dilatation or dissection. Diffuse mural thrombus is noted in the descending thoracic aorta. No cardiac enlargement is seen. Coronary calcifications are noted. The  pulmonary artery shows no definitive emboli. Mediastinum/Nodes: Thoracic inlet is within normal limits. There are scattered mediastinal and hilar lymph nodes which demonstrates central decreased attenuation consistent with necrosis. The largest of the mediastinal nodes lies in the subcarinal region measuring approximately 18 mm in short axis. The largest of the hilar nodes is noted measuring 13 mm in short axis. Scattered smaller right hilar nodes are seen. The esophagus is within normal limits. Lungs/Pleura: The lungs are well aerated bilaterally. There is a right upper lobe mass lesion abutting the mediastinum which demonstrates peripheral spiculation and measures approximately 2.9 by 3.1 cm in greatest AP and transverse dimensions respectively. It demonstrates central necrosis and is consistent with a primary pulmonary neoplasm and likely the etiology of the patient's known intracranial metastatic disease. A 5 mm nodule is noted in the right lower lobe on image number 112 of series 3 with a smaller right lower lobe nodule on image number 105 of series 3. No sizable effusion is  noted. No other parenchymal nodules are seen. Musculoskeletal: Degenerative changes of the thoracic spine are noted. CT ABDOMEN PELVIS FINDINGS Hepatobiliary: Fatty infiltration of the liver is noted. The gallbladder is within normal limits. Pancreas: Unremarkable. No pancreatic ductal dilatation or surrounding inflammatory changes. Spleen: Normal in size without focal abnormality. Adrenals/Urinary Tract: The adrenal glands demonstrates small lesions bilaterally measuring almost 14 mm on the right and approximately 18 mm on the left. The findings in the chest, these are suspicious for adrenal metastasis. Renal cysts are seen. No calculi are noted. No obstructive changes are seen. The bladder is partially distended. Stomach/Bowel: Scattered diverticular change of the colon is noted without evidence of diverticulitis. The appendix is within  normal limits. No small bowel or gastric abnormality is noted. Vascular/Lymphatic: Aortic atherosclerosis. Considerable noncalcified plaque/mural thrombus is noted in the infrarenal aorta. No definitive aneurysmal dilatation is seen. No enlarged abdominal or pelvic lymph nodes. Reproductive: Status post hysterectomy. No adnexal masses. Other: No abdominal wall hernia or abnormality. No abdominopelvic ascites. Musculoskeletal: Degenerative changes of lumbar spine are seen. No definitive lytic lesions are noted. Chronic endplate deformity at the superior endplate of L1 is noted. IMPRESSION: Spiculated mass lesion in the right upper lobe with associated right hilar and mediastinal adenopathy consistent with primary pulmonary neoplasm. Two small nodules are noted in the right lower lobe. These likely represent small intrapulmonary metastatic lesions. Further workup is recommended to include tissue sampling and likely PET-CT. Lesions within the adrenal glands bilaterally suspicious for metastatic disease. Chronic changes in the abdomen. Aortic Atherosclerosis (ICD10-I70.0). These results will be called to the ordering clinician or representative by the Radiologist Assistant, and communication documented in the PACS or [employer]. Electronically Signed   By: Inez Catalina M.D.   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 Check for primary EXAM: CT CHEST, ABDOMEN, AND PELVIS WITH CONTRAST TECHNIQUE: Multidetector CT imaging of the chest, abdomen and pelvis was performed following the standard protocol during bolus administration of intravenous contrast. CONTRAST:  8mL OMNIPAQUE IOHEXOL 300 MG/ML  SOLN COMPARISON:  None. FINDINGS: CT CHEST FINDINGS Cardiovascular: Thoracic aorta and its branches demonstrate atherosclerotic calcifications without aneurysmal dilatation or dissection. Diffuse mural  thrombus is noted in the descending thoracic aorta. No cardiac enlargement is seen. Coronary calcifications are noted. The pulmonary artery shows no definitive emboli. Mediastinum/Nodes: Thoracic inlet is within normal limits. There are scattered mediastinal and hilar lymph nodes which demonstrates central decreased attenuation consistent with necrosis. The largest of the mediastinal nodes lies in the subcarinal region measuring approximately 18 mm in short axis. The largest of the hilar nodes is noted measuring 13 mm in short axis. Scattered smaller right hilar nodes are seen. The esophagus is within normal limits. Lungs/Pleura: The lungs are well aerated bilaterally. There is a right upper lobe mass lesion abutting the mediastinum which demonstrates peripheral spiculation and measures approximately 2.9 by 3.1 cm in greatest AP and transverse dimensions respectively. It demonstrates central necrosis and is consistent with a primary pulmonary neoplasm and likely the etiology of the patient's known intracranial metastatic disease. A 5 mm nodule is noted in the right lower lobe on image number 112 of series 3 with a smaller right lower lobe nodule on image number 105 of series 3. No sizable effusion is noted. No other parenchymal nodules are seen. Musculoskeletal: Degenerative changes of the thoracic spine are noted.  CT ABDOMEN PELVIS FINDINGS Hepatobiliary: Fatty infiltration of the liver is noted. The gallbladder is within normal limits. Pancreas: Unremarkable. No pancreatic ductal dilatation or surrounding inflammatory changes. Spleen: Normal in size without focal abnormality. Adrenals/Urinary Tract: The adrenal glands demonstrates small lesions bilaterally measuring almost 14 mm on the right and approximately 18 mm on the left. The findings in the chest, these are suspicious for adrenal metastasis. Renal cysts are seen. No calculi are noted. No obstructive changes are seen. The bladder is partially distended.  Stomach/Bowel: Scattered diverticular change of the colon is noted without evidence of diverticulitis. The appendix is within normal limits. No small bowel or gastric abnormality is noted. Vascular/Lymphatic: Aortic atherosclerosis. Considerable noncalcified plaque/mural thrombus is noted in the infrarenal aorta. No definitive aneurysmal dilatation is seen. No enlarged abdominal or pelvic lymph nodes. Reproductive: Status post hysterectomy. No adnexal masses. Other: No abdominal wall hernia or abnormality. No abdominopelvic ascites. Musculoskeletal: Degenerative changes of lumbar spine are seen. No definitive lytic lesions are noted. Chronic endplate deformity at the superior endplate of L1 is noted. IMPRESSION: Spiculated mass lesion in the right upper lobe with associated right hilar and mediastinal adenopathy consistent with primary pulmonary neoplasm. Two small nodules are noted in the right lower lobe. These likely represent small intrapulmonary metastatic lesions. Further workup is recommended to include tissue sampling and likely PET-CT. Lesions within the adrenal glands bilaterally suspicious for metastatic disease. Chronic changes in the abdomen. Aortic Atherosclerosis (ICD10-I70.0). These results will be called to the ordering clinician or representative by the Radiologist Assistant, and communication documented in the PACS or [employer]. Electronically Signed   By: Inez Catalina M.D.   On: 08/27/2019 14:17    ASSESSMENT: 3 cm right upper lobe lung lesion with brain metastasis.  PLAN:    1. 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  CT scan results from August 27, 2019 reviewed independently and report as above with bilateral adrenal lesions also suspicious for metastatic disease.  MRI of the brain was completed at [facility] and is located in care everywhere.  Patient will require PET scan as well as EBUS to complete the staging work-up and  obtain a definitive diagnosis.  Case discussed with Dr. Patsey Berthold and pulmonary.  Return to clinic 1 week after her EBUS to discuss the results and treatment planning. 2.  Facial numbness: Secondary to brain metastasis.  Patient states this has improved since being initiated on Decadron by neurology.  Continue Decadron 8 mg twice daily. 3.  Brain metastasis: Decadron as above.  Patient will require XRT for definitive surgery and a referral has been placed.  Patient has decided to go on her preplanned vacation this week and will be evaluated by radiation oncology the week of July 12.  I spent a total of 60 minutes reviewing chart data, face-to-face evaluation with the patient, counseling and coordination of care as detailed above.  Patient expressed understanding and was in agreement with this plan. She also understands that She can call clinic at any time with any questions, concerns, or complaints.   Cancer Staging No matching staging information was found for the patient.  Lloyd Huger, MD   08/29/2019 7:21 AM

## 2019-09-07 ENCOUNTER — Other Ambulatory Visit: Payer: Self-pay

## 2019-09-07 ENCOUNTER — Ambulatory Visit
Admission: RE | Admit: 2019-09-07 | Discharge: 2019-09-07 | Disposition: A | Discharge status: Home / Self Care | Payer: Managed Care, Other (non HMO) | Source: Ambulatory Visit | Attending: Oncology | Admitting: Oncology

## 2019-09-07 DIAGNOSIS — C7951 Secondary malignant neoplasm of bone: Secondary | ICD-10-CM | POA: Insufficient documentation

## 2019-09-07 DIAGNOSIS — R590 Localized enlarged lymph nodes: Secondary | ICD-10-CM | POA: Diagnosis not present

## 2019-09-07 DIAGNOSIS — R918 Other nonspecific abnormal finding of lung field: Secondary | ICD-10-CM | POA: Diagnosis not present

## 2019-09-07 DIAGNOSIS — C7971 Secondary malignant neoplasm of right adrenal gland: Secondary | ICD-10-CM | POA: Diagnosis not present

## 2019-09-07 DIAGNOSIS — C7972 Secondary malignant neoplasm of left adrenal gland: Secondary | ICD-10-CM | POA: Diagnosis not present

## 2019-09-07 LAB — GLUCOSE, CAPILLARY: Glucose-Capillary: 118 mg/dL — ABNORMAL HIGH (ref 70–99)

## 2019-09-07 MED ORDER — FLUDEOXYGLUCOSE F - 18 (FDG) INJECTION
8.0000 | Freq: Once | INTRAVENOUS | Status: AC | PRN
  Administered 2019-09-07: 7.8 via INTRAVENOUS

## 2019-09-10 ENCOUNTER — Other Ambulatory Visit: Payer: Self-pay | Admitting: *Deleted

## 2019-09-10 ENCOUNTER — Encounter: Payer: Self-pay | Admitting: *Deleted

## 2019-09-10 ENCOUNTER — Other Ambulatory Visit: Payer: Self-pay

## 2019-09-10 ENCOUNTER — Ambulatory Visit
Admission: RE | Admit: 2019-09-10 | Discharge: 2019-09-10 | Disposition: A | Discharge status: Home / Self Care | Payer: Managed Care, Other (non HMO) | Source: Ambulatory Visit | Attending: Radiation Oncology | Admitting: Radiation Oncology

## 2019-09-10 ENCOUNTER — Inpatient Hospital Stay (HOSPITAL_BASED_OUTPATIENT_CLINIC_OR_DEPARTMENT_OTHER): Payer: Managed Care, Other (non HMO) | Admitting: Hospice and Palliative Medicine

## 2019-09-10 ENCOUNTER — Encounter: Payer: Self-pay | Admitting: Pulmonary Disease

## 2019-09-10 ENCOUNTER — Telehealth: Payer: Self-pay

## 2019-09-10 ENCOUNTER — Ambulatory Visit: Payer: Managed Care, Other (non HMO) | Admitting: Pulmonary Disease

## 2019-09-10 ENCOUNTER — Encounter: Payer: Self-pay | Admitting: Hospice and Palliative Medicine

## 2019-09-10 VITALS — BP 130/79 | HR 87 | Temp 96.2°F | Wt 152.0 lb

## 2019-09-10 VITALS — BP 120/60 | HR 78 | Temp 98.2°F | Ht 62.0 in | Wt 154.8 lb

## 2019-09-10 DIAGNOSIS — C7931 Secondary malignant neoplasm of brain: Secondary | ICD-10-CM

## 2019-09-10 DIAGNOSIS — G893 Neoplasm related pain (acute) (chronic): Secondary | ICD-10-CM

## 2019-09-10 DIAGNOSIS — F17210 Nicotine dependence, cigarettes, uncomplicated: Secondary | ICD-10-CM

## 2019-09-10 DIAGNOSIS — R590 Localized enlarged lymph nodes: Secondary | ICD-10-CM

## 2019-09-10 DIAGNOSIS — K219 Gastro-esophageal reflux disease without esophagitis: Secondary | ICD-10-CM | POA: Diagnosis not present

## 2019-09-10 DIAGNOSIS — C3411 Malignant neoplasm of upper lobe, right bronchus or lung: Secondary | ICD-10-CM | POA: Diagnosis not present

## 2019-09-10 DIAGNOSIS — R918 Other nonspecific abnormal finding of lung field: Secondary | ICD-10-CM

## 2019-09-10 DIAGNOSIS — E119 Type 2 diabetes mellitus without complications: Secondary | ICD-10-CM | POA: Diagnosis not present

## 2019-09-10 DIAGNOSIS — F419 Anxiety disorder, unspecified: Secondary | ICD-10-CM

## 2019-09-10 MED ORDER — HYDROCODONE-ACETAMINOPHEN 5-325 MG PO TABS: 1.0000 | ORAL_TABLET | Freq: Four times a day (QID) | ORAL | 0 refills | Status: DC | PRN

## 2019-09-10 MED ORDER — FLUCONAZOLE 100 MG PO TABS
100.0000 mg | ORAL_TABLET | Freq: Every day | ORAL | 0 refills | Status: DC
Start: 2019-09-10 — End: 2019-10-13

## 2019-09-10 MED ORDER — TRAZODONE HCL 50 MG PO TABS: 25.0000 mg | ORAL_TABLET | Freq: Every evening | ORAL | 2 refills | Status: DC | PRN

## 2019-09-10 MED ORDER — DEXAMETHASONE 4 MG PO TABS: 4.0000 mg | ORAL_TABLET | Freq: Two times a day (BID) | ORAL | 0 refills | Status: DC

## 2019-09-10 MED ORDER — SERTRALINE HCL 50 MG PO TABS
ORAL_TABLET | ORAL | 2 refills | Status: DC
Start: 2019-09-10 — End: 2019-12-29

## 2019-09-10 NOTE — Progress Notes (Signed)
Virtual Visit via Video Note  I connected with [name] on 09/10/19 at  1:30 PM EDT by a video enabled telemedicine application and verified that I am speaking with the correct person using two identifiers.   I discussed the limitations of evaluation and management by telemedicine and the availability of in person appointments. The patient expressed understanding and agreed to proceed.  History of Present Illness: 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  Patient initially presented with left facial numbness.  Work-up including CT and MRI revealed a 3 cm right upper lobe lung lesion and at least 3 brain metastases.  PET/CT revealed hypermetabolic right upper lobe lung mass invading the mediastinum with associated right hilar, infrahilar, and subcarinal metastatic adenopathy.  PET also revealed hypermetabolic epicardial lymph nodes, bilateral adrenal gland metastasis, and bone metastasis in the left femur.  Patient is being referred to pulmonary for consideration of bronchoscopy for tissue diagnosis.  Patient saw Dr. Baruch Gouty on 09/10/2019 with plan for whole brain radiation and XRT to her left femur.   Observations/Objective: Patient was an add-on to my clinic schedule today due to pain and anxiety.  Patient reports that her pain is primarily in the left hip/leg at the known side of her femur mass.  Pain is exacerbated with movement.  She says that the pain has improved after initiation of dexamethasone but is still intermittently severe.  Patient has a remote history of taking Norco when she had a surgical procedure and found it to be effective.  Patient also endorses anxiety/depression.  She says that she is crying often during unprovoked situations such as walking into a store hearing following the radio.  She is in agreement with trial of an antidepressant.  She recognizes that it may  take weeks prior to being fully effective.  Patient endorses chronic insomnia since she separated from her husband about a year ago.  However, she says that her insomnia has been worse since she received her diagnosis.  She is only getting a couple of hours of sleep at a time.  PDMP reviewed.   Assessment and Plan: Stage IV lung cancer - patient is pending referral to pulmonary for consideration of bronch/tissue diagnosis. She was seen by Dr. Baruch Gouty and will start whole brain RT and RT to L. Femur. Patient will then have follow up with Dr. Grayland Ormond after biopsy to determine options for systemic treatment.   Neoplasm related pain - Hopefully, patient's pain will improve following initiation of XRT. Currently, patient is on dexamethasone 4mg  BID, which should provide some pain relief due to bony mets.  We will start Norco 5-325 mg every 6 hours as needed for pain.  Discussed safe storage administration of opioid medications.  Patient understands that she is not to drive on these medications.  Recommend OTC laxatives as needed for constipation.  Depression/Anxiety -start Zoloft 25 mg daily x 1 week and then increase to 50 mg daily  Insomnia -start trazodone 25 to 50 mg nightly.  Follow Up Instructions: Will follow in palliative care clinic MyChart visit in 2-3 weeks   I discussed the assessment and treatment plan with the patient. The patient was provided an opportunity to ask questions and all were answered. The patient agreed with the plan and demonstrated an understanding of the instructions.   The patient was advised to call back or seek an in-person evaluation if the symptoms worsen  or if the condition fails to improve as anticipated.  I provided 15 minutes of non-face-to-face time during this encounter.   Irean Hong, NP

## 2019-09-10 NOTE — Consult Note (Signed)
NEW PATIENT EVALUATION  Name: Sandra Leonard  MRN: [id number]  Date:   09/10/2019     DOB: 03/21/1955   This 64 y.o. 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  REFERRING PHYSICIAN: Lloyd Huger, MD  CHIEF COMPLAINT:  Chief Complaint  Patient presents with  . Brain Mets    DIAGNOSIS: The encounter diagnosis was Brain metastases ([place]).   PREVIOUS INVESTIGATIONS:  PET/CT reviewed MRI scan report reviewed awaiting arrival of downloading of the films Clinical notes reviewed Pathology pending  HPI: Patient is a 64 year old female who presented with numbness on the left side of her face.  She had no cough hemoptysis or chest tightness.  On work-up on CT scan she was found to have a 3 cm right upper lobe lung lesion.  MRI scan through [facility] also showed at least 3 brain metastasis.  She also has by PET/CT criteria bilateral to hypermetabolic lower epicardial lymph nodes consistent with metastasis bilateral adrenal gland metastasis and cortical bone metastasis involving the upper left femur posteriorly.  She has been having some left leg pain.  She is currently on Decadron having a mild sore throat.  She is scheduled to see pulmonology for consideration of bronchoscopy for tissue diagnosis.  She is now referred to radiation collagen for consideration of palliative treatment.  PLANNED TREATMENT REGIMEN: Whole brain radiation plus palliative radiation therapy to the left femur  PAST MEDICAL HISTORY:  has a past medical history of Diabetes mellitus without complication (HCC) and GERD (gastroesophageal reflux disease).    PAST SURGICAL HISTORY:  Past Surgical History:  Procedure Laterality Date  . ABDOMINAL HYSTERECTOMY      FAMILY HISTORY: family history includes Breast cancer (age of onset: 34) in her mother; Diabetes in her brother, mother, and sister; Hypertension in her  father, mother, and sister.  SOCIAL HISTORY:  reports that she has been smoking. She has a 33.00 pack-year smoking history. She has never used smokeless tobacco. She reports that she does not drink alcohol and does not use drugs.  ALLERGIES: Codeine, Penicillins, and Sulfa antibiotics  MEDICATIONS:  Current Outpatient Medications  Medication Sig Dispense Refill  . atorvastatin (LIPITOR) 10 MG tablet Take 10 mg by mouth daily.    . diclofenac (VOLTAREN) 75 MG EC tablet Take 75 mg by mouth in the morning and at bedtime.    . diphenhydrAMINE HCl (BENADRYL ALLERGY PO) Take 1 tablet by mouth at bedtime.    . empagliflozin (JARDIANCE) 10 MG TABS tablet Take 10 mg by mouth in the morning and at bedtime.    Marland Kitchen ibuprofen (ADVIL) 200 MG tablet Take 200 mg by mouth as needed.    Marland Kitchen lisinopril (ZESTRIL) 5 MG tablet Take 5 mg by mouth daily.    . metFORMIN (GLUCOPHAGE) 1000 MG tablet Take 1 tablet (1,000 mg total) by mouth 2 (two) times daily with a meal. 180 tablet 3  . omeprazole (PRILOSEC) 20 MG capsule TAKE 1 CAPSULE (20 MG TOTAL) BY MOUTH DAILY. 30 capsule 6  . ONE TOUCH ULTRA TEST test strip TEST BLOOD SUGAR IN THE MORNING AND 2 HOURS AFTER LARGEST MEAL 50 each 2  . dexamethasone (DECADRON) 4 MG tablet Take 1 tablet (4 mg total) by mouth in the morning and at bedtime. 60 tablet 0  . fluconazole (DIFLUCAN) 100 MG tablet Take 1 tablet (100 mg total) by mouth daily. 7 tablet  0   No current facility-administered medications for this encounter.    ECOG PERFORMANCE STATUS:  1 - Symptomatic but completely ambulatory  REVIEW OF SYSTEMS: Patient denies any weight loss, fatigue, weakness, fever, chills or night sweats. Patient denies any loss of vision, blurred vision. Patient denies any ringing  of the ears or hearing loss. No irregular heartbeat. Patient denies heart murmur or history of fainting. Patient denies any chest pain or pain radiating to her upper extremities. Patient denies any shortness of  breath, difficulty breathing at night, cough or hemoptysis. Patient denies any swelling in the lower legs. Patient denies any nausea vomiting, vomiting of blood, or coffee ground material in the vomitus. Patient denies any stomach pain. Patient states has had normal bowel movements no significant constipation or diarrhea. Patient denies any dysuria, hematuria or significant nocturia. Patient denies any problems walking, swelling in the joints or loss of balance. Patient denies any skin changes, loss of hair or loss of weight. Patient denies any excessive worrying or anxiety or significant depression. Patient denies any problems with insomnia. Patient denies excessive thirst, polyuria, polydipsia. Patient denies any swollen glands, patient denies easy bruising or easy bleeding. Patient denies any recent infections, allergies or URI. Patient "s visual fields have not changed significantly in recent time.   PHYSICAL EXAM: BP 130/79   Pulse 87   Temp (!) 96.2 F (35.7 C) (Tympanic)   Wt 152 lb (68.9 kg)   BMI 26.93 kg/m  Patient does have oral candidiasis She does have some pain on palpation of the upper shaft of her left femur.  Crude visual fields within normal range.  Neurologic exam is unremarkable.  Well-developed well-nourished patient in NAD. HEENT reveals PERLA, EOMI, discs not visualized.  Oral cavity is clear. No oral mucosal lesions are identified. Neck is clear without evidence of cervical or supraclavicular adenopathy. Lungs are clear to A&P. Cardiac examination is essentially unremarkable with regular rate and rhythm without murmur rub or thrill. Abdomen is benign with no organomegaly or masses noted. Motor sensory and DTR levels are equal and symmetric in the upper and lower extremities. Cranial nerves II through XII are grossly intact. Proprioception is intact. No peripheral adenopathy or edema is identified. No motor or sensory levels are noted. Crude visual fields are within normal  range.  LABORATORY DATA: Pathology is pending her labs are reviewed    RADIOLOGY RESULTS: CT scan and PET CT scan reviewed MRI report reviewed pending downloading of her films   IMPRESSION: Stage IV probable non-small cell lung cancer in 64 year old female with brain and bone metastasis.  PLAN: At this time I will go ahead with treatment planning to whole brain radiation.  I would plan on delivering 3000 cGy in 12 fractions.  We will keep her on her steroid dose.  I am starting her on Diflucan for 7 days for her oral candidiasis.  I will keep her Decadron renewed and current level of 4 mg twice a day.  I have also ordered CT simulation of her whole brain as well as her left femur.  We will treat her left femur 3000 cGy in 10 fractions.  Risks and benefits of both areas including possible cognitive decline hair loss fatigue alteration of blood count skin reaction all were discussed in detail.  She comprehends my treatment plan well.  I would like to take this opportunity to thank you for allowing me to participate in the care of your patient.Noreene Filbert, MD

## 2019-09-10 NOTE — Telephone Encounter (Addendum)
Pt has been scheduled for EBUS on 09/21/2019 at 1:00. MG:QQPY mass (R91.8) and mediastinal adenopathy (R59.0) [id number]  Suanne Marker, please see bronch info.

## 2019-09-10 NOTE — Patient Instructions (Signed)
As we discussed today, you have been scheduled for your procedure on July 26 at 1 PM through the [facility] on the second floor of [facility].  You will be asked not to have anything to eat or drink from the night prior the day of the procedure.  Received more instructions from the preoperative center.  We will see you in follow-up in 3 weeks time post procedure however, we will be calling you prior to that with your biopsy results.  The procedure that you were going to have his bronchoscopy (a scope placed to look at your lungs) with endobronchial ultrasound, and ultrasound that will help US guide the needle for biopsy).  The procedure will be done under general anesthesia.

## 2019-09-10 NOTE — Progress Notes (Signed)
Patient prescreened for visit, reports that she's been having anxiety surrounding her diagnosis. Having difficulty sleeping at night, will take benadryl to fall asleep, but then wakes up often; only sleeping about 2 hours at a time. Also concerned that she will start to get headaches when she starts radiation.

## 2019-09-10 NOTE — Progress Notes (Signed)
  Oncology Nurse Navigator Documentation  Navigator Location: [facility] (09/10/19 1300)   )Navigator Encounter Type: Initial RadOnc (09/10/19 1300)                       Treatment Phase: Pre-Tx/Tx Discussion (09/10/19 1300) Barriers/Navigation Needs: Coordination of Care;Anxiety;Pain (09/10/19 1300)   Interventions: Referrals (09/10/19 1300) Referrals: Symptom Management (09/10/19 1300)          Acuity: Level 3-Moderate Needs (3-4 Barriers Identified) (09/10/19 1300)    met with patient during initial consult with Dr. Baruch Gouty to discuss radiation therapy. All questions answered during visit. Reassurance provided. Reviewed with patient that will schedule her for further follow up with Dr. Grayland Ormond once she is scheduled for her biopsy. Pt voiced concerns regarding anxiety and pain and wanted to know if could get prescriptions to help with symptoms. Spoke with Merrily Pew who will meet with pt virtually this afternoon to further discuss. Contact info given to pt and instructed to call with any further questions or needs. Pt verbalized understanding. Nothing further needed at this time.     Time Spent with Patient: 60 (09/10/19 1300)

## 2019-09-10 NOTE — Progress Notes (Signed)
Subjective:    Patient ID: Sandra Leonard, female    DOB: 09-29-1955, 64 y.o.   MRN: [id number]  HPI Patient is a 64 year old current smoker (1 PPD) who presented on 21 Jul 2019 2 her primary physician's office with a complaint of numbness on the left side of her face.  She was referred to neurology and evaluated on 29 July 2019 for this issue.  Evaluation by neurology indicated that the patient actually had been having left facial numbness since March 2021.  An MRI without contrast was ordered at that time to rule out any potential structural abnormalities.  And on 13 August 2019 at [facility].  This actually showed 3 lesions in the brain that were hard to characterize and an MRI with contrast was requested.  The MRI was performed on 30 June again at [facility] and the lesions were noted to be ring-enhancing consistent with metastatic disease.  In the interim the patient also developed some issues with left hip pain noted to be due to metastatic disease.  A CT chest with contrast was performed on 27 August 2019 and this has shown a RIGHT upper lobe medially located mass approximately 3 cm in size with spiculation consistent with carcinoma patient also shows significant hilar and mediastinal adenopathy.  PET/CT was obtained on 12 July which shows FDG activity on all these areas.  Patient is undergoing radiation therapy to the brain and will also get radiation therapy to the left hip for pain control.  Patient meets diagnosis of the lesions and therefore has been referred for consideration of bronchoscopy with endobronchial ultrasound.  The patient has not had any respiratory symptoms.  No cough, sputum production or hemoptysis.  She continues to smoke a pack of cigarettes per day as noted above.  She has been smoking for approximately 40 years.  She has had weight loss but it has been intentional according to her.  She does have symptoms of gastroesophageal reflux which she has had for years and has not changed in  character.  Aside from the symptoms noted above she has no other symptomatology.  Review of Systems A 10 point review of systems was performed and it is as noted above otherwise negative.  Past Medical History:  Diagnosis Date   Diabetes mellitus without complication (HCC)    GERD (gastroesophageal reflux disease)    Past Surgical History:  Procedure Laterality Date   ABDOMINAL HYSTERECTOMY     Family History  Problem Relation Age of Onset   Breast cancer Mother 37   Diabetes Mother    Hypertension Mother    Hypertension Father    Diabetes Sister    Hypertension Sister    Diabetes Brother    Social History   Tobacco Use   Smoking status: Current Every Day Smoker    Packs/day: 1.00    Years: 33.00    Pack years: 33.00   Smokeless tobacco: Never Used  Substance Use Topics   Alcohol use: No    Alcohol/week: 0.0 standard drinks   Allergies  Allergen Reactions   Codeine    Penicillins    Sulfa Antibiotics Rash   Current Meds  Medication Sig   atorvastatin (LIPITOR) 10 MG tablet Take 10 mg by mouth daily.   dexamethasone (DECADRON) 4 MG tablet Take 1 tablet (4 mg total) by mouth in the morning and at bedtime.   diphenhydrAMINE HCl (BENADRYL ALLERGY PO) Take 1 tablet by mouth at bedtime.   empagliflozin (JARDIANCE) 10 MG TABS  tablet Take 10 mg by mouth in the morning and at bedtime.   fluconazole (DIFLUCAN) 100 MG tablet Take 1 tablet (100 mg total) by mouth daily.   lisinopril (ZESTRIL) 5 MG tablet Take 5 mg by mouth daily.   metFORMIN (GLUCOPHAGE) 1000 MG tablet Take 1 tablet (1,000 mg total) by mouth 2 (two) times daily with a meal.   omeprazole (PRILOSEC) 20 MG capsule TAKE 1 CAPSULE (20 MG TOTAL) BY MOUTH DAILY.   ONE TOUCH ULTRA TEST test strip TEST BLOOD SUGAR IN THE MORNING AND 2 HOURS AFTER LARGEST MEAL    Immunization History  Administered Date(s) Administered   Influenza, Seasonal, Injecte, Preservative Fre 12/14/2010, 12/18/2012     Influenza,inj,Quad PF,6+ Mos 01/20/2019   Influenza-Unspecified 12/20/2011, 12/15/2015, 12/13/2016, 01/20/2019   Td 05/11/1992   Tdap 12/13/2009       Objective:   Physical Exam BP 120/60 (BP Location: Right Arm, Cuff Size: Normal)    Pulse 78    Temp 98.2 F (36.8 C) (Oral)    Ht 5\' 2"  (1.575 m)    Wt 154 lb 12.8 oz (70.2 kg)    SpO2 98%    BMI 28.31 kg/m   GENERAL: Plethoric appearing woman, no acute distress, fully ambulatory.  HEAD: Normocephalic, atraumatic.  EYES: Pupils equal, round, reactive to light.  No scleral icterus.  MOUTH: Nose/mouth/throat not examined due to masking requirements for COVID 19. NECK: Supple. No thyromegaly. Trachea midline. No JVD.  No adenopathy. PULMONARY: Good air entry bilaterally, coarse breath sounds, no other adventitious sounds.  CARDIOVASCULAR: S1 and S2. Regular rate and rhythm.  No rubs, murmurs or gallops. GASTROINTESTINAL: Nondistended abdomen, benign. MUSCULOSKELETAL: No joint deformity, no clubbing, no edema.  NEUROLOGIC: No overt focal deficit.  Gait shows left limp due to pain. SKIN: Intact,warm,dry.  Plethoric appearing, no rashes. PSYCH: Mood is anxious, behavior normal.   Representative slices of the CT chest performed 27 August 2019:         Representative slices of the PET/CT performed 07 September 2019:       Buddies independently reviewed.  Studies were also shown to the patient.  Assessment & Plan:     ICD-10-CM   1. Mediastinal adenopathy  R59.0    Appropriate procedure for diagnosis will be bronchoscopy with endobronchial ultrasound Scheduled for 26 July at 1 PM Same[facility] [place] [facility]  2. Mass of upper lobe of right lung  R91.8    Central mass right upper lobe Airway leading to the mass appears stenotic   3. Controlled type 2 diabetes mellitus without complication, without long-term current use of insulin ([place])  E11.9    Patient well controlled Will sample glucose preoperatively  4.  Gastroesophageal reflux disease, unspecified whether esophagitis present  K21.9    This issue adds complexity to her management Recommend H2 blocker or PPI IV prior to procedure  5. Tobacco dependence due to cigarettes  F17.210    Patient was counseled regards to discontinuation of smoking Discussed use of nicotine replacement patches Total counseling time 5 minutes   Discussion:  Patient has evidence of stage IV cancer likely primary lung cancer.  Will proceed with bronchoscopy with endobronchial ultrasound under general anesthesia.  Benefits, limitations and potential complications of the procedure were discussed with the patient and she agrees to proceed. Procedure scheduled for 21 September 2019 at 1 PM.  C. Derrill Kay, MD [facility]   *This note was dictated using voice recognition software/Dragon.  Despite best efforts to proofread,  errors can occur which can change the meaning.  Any change was purely unintentional.

## 2019-09-10 NOTE — H&P (View-Only) (Signed)
Subjective:    Patient ID: Sandra Leonard, female    DOB: 11-26-1955, 64 y.o.   MRN: [id number]  HPI Patient is a 64 year old current smoker (1 PPD) who presented on 21 Jul 2019 2 her primary physician's office with a complaint of numbness on the left side of her face.  She was referred to neurology and evaluated on 29 July 2019 for this issue.  Evaluation by neurology indicated that the patient actually had been having left facial numbness since March 2021.  An MRI without contrast was ordered at that time to rule out any potential structural abnormalities.  And on 13 August 2019 at [facility].  This actually showed 3 lesions in the brain that were hard to characterize and an MRI with contrast was requested.  The MRI was performed on 30 June again at [facility] and the lesions were noted to be ring-enhancing consistent with metastatic disease.  In the interim the patient also developed some issues with left hip pain noted to be due to metastatic disease.  A CT chest with contrast was performed on 27 August 2019 and this has shown a RIGHT upper lobe medially located mass approximately 3 cm in size with spiculation consistent with carcinoma patient also shows significant hilar and mediastinal adenopathy.  PET/CT was obtained on 12 July which shows FDG activity on all these areas.  Patient is undergoing radiation therapy to the brain and will also get radiation therapy to the left hip for pain control.  Patient meets diagnosis of the lesions and therefore has been referred for consideration of bronchoscopy with endobronchial ultrasound.  The patient has not had any respiratory symptoms.  No cough, sputum production or hemoptysis.  She continues to smoke a pack of cigarettes per day as noted above.  She has been smoking for approximately 40 years.  She has had weight loss but it has been intentional according to her.  She does have symptoms of gastroesophageal reflux which she has had for years and has not changed in  character.  Aside from the symptoms noted above she has no other symptomatology.  Review of Systems A 10 point review of systems was performed and it is as noted above otherwise negative.  Past Medical History:  Diagnosis Date   Diabetes mellitus without complication (HCC)    GERD (gastroesophageal reflux disease)    Past Surgical History:  Procedure Laterality Date   ABDOMINAL HYSTERECTOMY     Family History  Problem Relation Age of Onset   Breast cancer Mother 8   Diabetes Mother    Hypertension Mother    Hypertension Father    Diabetes Sister    Hypertension Sister    Diabetes Brother    Social History   Tobacco Use   Smoking status: Current Every Day Smoker    Packs/day: 1.00    Years: 33.00    Pack years: 33.00   Smokeless tobacco: Never Used  Substance Use Topics   Alcohol use: No    Alcohol/week: 0.0 standard drinks   Allergies  Allergen Reactions   Codeine    Penicillins    Sulfa Antibiotics Rash   Current Meds  Medication Sig   atorvastatin (LIPITOR) 10 MG tablet Take 10 mg by mouth daily.   dexamethasone (DECADRON) 4 MG tablet Take 1 tablet (4 mg total) by mouth in the morning and at bedtime.   diphenhydrAMINE HCl (BENADRYL ALLERGY PO) Take 1 tablet by mouth at bedtime.   empagliflozin (JARDIANCE) 10 MG TABS  tablet Take 10 mg by mouth in the morning and at bedtime.   fluconazole (DIFLUCAN) 100 MG tablet Take 1 tablet (100 mg total) by mouth daily.   lisinopril (ZESTRIL) 5 MG tablet Take 5 mg by mouth daily.   metFORMIN (GLUCOPHAGE) 1000 MG tablet Take 1 tablet (1,000 mg total) by mouth 2 (two) times daily with a meal.   omeprazole (PRILOSEC) 20 MG capsule TAKE 1 CAPSULE (20 MG TOTAL) BY MOUTH DAILY.   ONE TOUCH ULTRA TEST test strip TEST BLOOD SUGAR IN THE MORNING AND 2 HOURS AFTER LARGEST MEAL    Immunization History  Administered Date(s) Administered   Influenza, Seasonal, Injecte, Preservative Fre 12/14/2010, 12/18/2012     Influenza,inj,Quad PF,6+ Mos 01/20/2019   Influenza-Unspecified 12/20/2011, 12/15/2015, 12/13/2016, 01/20/2019   Td 05/11/1992   Tdap 12/13/2009       Objective:   Physical Exam BP 120/60 (BP Location: Right Arm, Cuff Size: Normal)    Pulse 78    Temp 98.2 F (36.8 C) (Oral)    Ht 5\' 2"  (1.575 m)    Wt 154 lb 12.8 oz (70.2 kg)    SpO2 98%    BMI 28.31 kg/m   GENERAL: Plethoric appearing woman, no acute distress, fully ambulatory.  HEAD: Normocephalic, atraumatic.  EYES: Pupils equal, round, reactive to light.  No scleral icterus.  MOUTH: Nose/mouth/throat not examined due to masking requirements for COVID 19. NECK: Supple. No thyromegaly. Trachea midline. No JVD.  No adenopathy. PULMONARY: Good air entry bilaterally, coarse breath sounds, no other adventitious sounds.  CARDIOVASCULAR: S1 and S2. Regular rate and rhythm.  No rubs, murmurs or gallops. GASTROINTESTINAL: Nondistended abdomen, benign. MUSCULOSKELETAL: No joint deformity, no clubbing, no edema.  NEUROLOGIC: No overt focal deficit.  Gait shows left limp due to pain. SKIN: Intact,warm,dry.  Plethoric appearing, no rashes. PSYCH: Mood is anxious, behavior normal.   Representative slices of the CT chest performed 27 August 2019:         Representative slices of the PET/CT performed 07 September 2019:       Buddies independently reviewed.  Studies were also shown to the patient.  Assessment & Plan:     ICD-10-CM   1. Mediastinal adenopathy  R59.0    Appropriate procedure for diagnosis will be bronchoscopy with endobronchial ultrasound Scheduled for 26 July at 1 PM Same[facility] [place] [facility]  2. Mass of upper lobe of right lung  R91.8    Central mass right upper lobe Airway leading to the mass appears stenotic   3. Controlled type 2 diabetes mellitus without complication, without long-term current use of insulin ([place])  E11.9    Patient well controlled Will sample glucose preoperatively  4.  Gastroesophageal reflux disease, unspecified whether esophagitis present  K21.9    This issue adds complexity to her management Recommend H2 blocker or PPI IV prior to procedure  5. Tobacco dependence due to cigarettes  F17.210    Patient was counseled regards to discontinuation of smoking Discussed use of nicotine replacement patches Total counseling time 5 minutes   Discussion:  Patient has evidence of stage IV cancer likely primary lung cancer.  Will proceed with bronchoscopy with endobronchial ultrasound under general anesthesia.  Benefits, limitations and potential complications of the procedure were discussed with the patient and she agrees to proceed. Procedure scheduled for 21 September 2019 at 1 PM.  C. Derrill Kay, MD [place] [facility]   *This note was dictated using voice recognition software/Dragon.  Despite best efforts to proofread,  errors can occur which can change the meaning.  Any change was purely unintentional.

## 2019-09-11 NOTE — Telephone Encounter (Signed)
09/11/2019 at 1:56 pm EST Called [employer] at [phone]. Procedure code [phone] is a V/B Code and does not require PA Call Ref # [id number]. Procedure code [id number] is a V/B code which does not require PA Call Ref # [id number]. Rhonda J Cobb

## 2019-09-11 NOTE — Telephone Encounter (Addendum)
Phone pre admit visit is scheduled for 09/16/2019 between 8-1 with covid test on 09/17/2019. Pt is aware and voiced her understanding.  Nothing further is needed at this time.

## 2019-09-16 ENCOUNTER — Ambulatory Visit
Admission: RE | Admit: 2019-09-16 | Discharge: 2019-09-16 | Disposition: A | Discharge status: Home / Self Care | Payer: Managed Care, Other (non HMO) | Source: Ambulatory Visit | Attending: Radiation Oncology | Admitting: Radiation Oncology

## 2019-09-16 ENCOUNTER — Other Ambulatory Visit: Payer: Self-pay

## 2019-09-16 ENCOUNTER — Encounter
Admission: RE | Admit: 2019-09-16 | Discharge: 2019-09-16 | Disposition: A | Discharge status: Home / Self Care | Payer: Managed Care, Other (non HMO) | Source: Ambulatory Visit | Attending: Pulmonary Disease | Admitting: Pulmonary Disease

## 2019-09-16 ENCOUNTER — Encounter: Payer: Self-pay | Admitting: *Deleted

## 2019-09-16 DIAGNOSIS — Z510 Encounter for antineoplastic radiation therapy: Secondary | ICD-10-CM | POA: Diagnosis present

## 2019-09-16 DIAGNOSIS — C7951 Secondary malignant neoplasm of bone: Secondary | ICD-10-CM | POA: Insufficient documentation

## 2019-09-16 DIAGNOSIS — C7931 Secondary malignant neoplasm of brain: Secondary | ICD-10-CM | POA: Insufficient documentation

## 2019-09-16 DIAGNOSIS — C3411 Malignant neoplasm of upper lobe, right bronchus or lung: Secondary | ICD-10-CM | POA: Insufficient documentation

## 2019-09-16 HISTORY — DX: Congenital malformations of adrenal gland: Q89.1

## 2019-09-16 HISTORY — DX: Disorder of brain, unspecified: G93.9

## 2019-09-16 HISTORY — DX: Disorder of bone, unspecified: M89.9

## 2019-09-16 NOTE — Patient Instructions (Signed)
COVID TESTING Date: September 17, 2019 THURSDAY Testing site:  [facility] [address] Thru Hours:  4:09 am - 1:00 pm Once you are tested, you are asked to stay quarantined (avoiding public places) until after your surgery.   Your procedure is scheduled on: September 21, 2019 MONDAY Report to Day Surgery on the 2nd floor of the [facility]. To find out your arrival time, please call [phone] between 1PM - 3PM on: Friday  September 18, 2019  REMEMBER: Instructions that are not followed completely may result in serious medical risk, up to and including death; or upon the discretion of your surgeon and anesthesiologist your surgery may need to be rescheduled.  Do not eat food after midnight the night before surgery.  No gum chewing, lozengers or hard candies.  You may however, drink CLEAR liquids up to 2 hours before you are scheduled to arrive for your surgery. Do not drink anything within 2 hours of your scheduled arrival time.  Clear liquids include: - water   Do NOT drink anything that is not on this list.  Type 1 and Type 2 diabetics should only drink water.   TAKE THESE MEDICATIONS THE MORNING OF SURGERY WITH A SIP OF WATER: DEXAMETHASONE NEXIUM (take one the night before and one on the morning of surgery - helps to prevent nausea after surgery.)  Stop Metformin 2 days prior to surgery. LAST DOSE 09/18/2019  Stop Anti-inflammatories (NSAIDS) such as Advil, Aleve, Ibuprofen, Motrin, Naproxen, Naprosyn and ASPIRIN OR Aspirin based products such as Excedrin, Goodys Powder, BC Powder. (May take Tylenol or Acetaminophen if needed.)  Stop ANY OVER THE COUNTER supplements until after surgery. (May continue Vitamin D, Vitamin B, and multivitamin.)  No Alcohol for 24 hours before or after surgery.  No Smoking including e-cigarettes for 24 hours prior to surgery.  No chewable tobacco products for at least 6 hours prior to surgery.  No nicotine patches on  the day of surgery.  Do not use any "recreational" drugs for at least a week prior to your surgery.  Please be advised that the combination of cocaine and anesthesia may have negative outcomes, up to and including death. If you test positive for cocaine, your surgery will be cancelled.  On the morning of surgery brush your teeth with toothpaste and water, you may rinse your mouth with mouthwash if you wish. Do not swallow any toothpaste or mouthwash.  Do not wear jewelry, make-up, hairpins, clips or nail polish.  Do not wear lotions, powders, or perfumes.   Do not shave 48 hours prior to surgery.   Contact lenses, hearing aids and dentures may not be worn into surgery.  Do not bring valuables to the hospital. [facility] is not responsible for any missing/lost belongings or valuables.   SHOWER DAY OF SURGERY  Notify your doctor if there is any change in your medical condition (cold, fever, infection).  Wear comfortable clothing (specific to your surgery type) to the hospital.  Plan for stool softeners for home use; pain medications have a tendency to cause constipation. You can also help prevent constipation by eating foods high in fiber such as fruits and vegetables and drinking plenty of fluids as your diet allows.  After surgery, you can help prevent lung complications by doing breathing exercises.  Take deep breaths and cough every 1-2 hours. Your doctor may order a device called an Incentive Spirometer to help you take deep breaths. When coughing or sneezing, hold a  pillow firmly against your incision with both hands. This is called splinting. Doing this helps protect your incision. It also decreases belly discomfort.  If you are being discharged the day of surgery, you will not be allowed to drive home. You will need a responsible adult (18 years or older) to drive you home and stay with you that night.    Please call the [facility] at [phone] if  you have any questions about these instructions.  Visitation Policy:  Patients undergoing a surgery or procedure may have one family member or support person with them as long as that person is not COVID-19 positive or experiencing its symptoms.  That person may remain in the waiting area during the procedure.  Children under 3 years of age may have both parents or legal guardians with them during their procedure.  Inpatient Visitation Update:   Two designated support people may visit a patient during visiting hours 7 am to 8 pm. It must be the same two designated people for the duration of the patient stay. The visitors may come and go during the day, and there is no switching out to have different visitors. A mask must be worn at all times, including in the patient room.  Children under 65 years of age:  a total of 4 designated visitors for the childs entire stay are allowed. Only 2 in the room at a time and only one staying overnight at a time. The overnight guest can now rotate during the childs hospital stay.  As a reminder, masks are still required for all [facility] team members, patients and visitors in all [place] facilities.   Systemwide, no visitors 17 or younger.

## 2019-09-17 ENCOUNTER — Encounter: Payer: Self-pay | Admitting: Urgent Care

## 2019-09-17 ENCOUNTER — Encounter
Admission: RE | Admit: 2019-09-17 | Discharge: 2019-09-17 | Disposition: A | Discharge status: Home / Self Care | Payer: Managed Care, Other (non HMO) | Source: Ambulatory Visit | Attending: Pulmonary Disease | Admitting: Pulmonary Disease

## 2019-09-17 DIAGNOSIS — Z20822 Contact with and (suspected) exposure to covid-19: Secondary | ICD-10-CM | POA: Diagnosis not present

## 2019-09-17 DIAGNOSIS — Z01818 Encounter for other preprocedural examination: Secondary | ICD-10-CM | POA: Diagnosis present

## 2019-09-17 LAB — BASIC METABOLIC PANEL
Anion gap: 12 (ref 5–15)
BUN: 25 mg/dL — ABNORMAL HIGH (ref 8–23)
CO2: 24 mmol/L (ref 22–32)
Calcium: 8.8 mg/dL — ABNORMAL LOW (ref 8.9–10.3)
Chloride: 94 mmol/L — ABNORMAL LOW (ref 98–111)
Creatinine, Ser: 0.43 mg/dL — ABNORMAL LOW (ref 0.44–1.00)
GFR calc Af Amer: >60 mL/min (ref >60)
GFR calc non Af Amer: >60 mL/min (ref >60)
Glucose, Bld: 140 mg/dL — ABNORMAL HIGH (ref 70–99)
Potassium: 4.3 mmol/L (ref 3.5–5.1)
Sodium: 130 mmol/L — ABNORMAL LOW (ref 135–145)

## 2019-09-17 LAB — CBC
HCT: 44.2 % (ref 36.0–46.0)
Hemoglobin: 15.7 g/dL — ABNORMAL HIGH (ref 12.0–15.0)
MCH: 30.8 pg (ref 26.0–34.0)
MCHC: 35.5 g/dL (ref 30.0–36.0)
MCV: 86.7 fL (ref 80.0–100.0)
Platelets: 344 10*3/uL (ref 150–400)
RBC: 5.1 MIL/uL (ref 3.87–5.11)
RDW: 13.2 % (ref 11.5–15.5)
WBC: 17.6 10*3/uL — ABNORMAL HIGH (ref 4.0–10.5)
nRBC: 0 % (ref 0.0–0.2)

## 2019-09-17 LAB — SARS CORONAVIRUS 2 (TAT 6-24 HRS): SARS Coronavirus 2: NEGATIVE

## 2019-09-17 NOTE — Progress Notes (Signed)
Oncology Nurse Navigator Documentation ° °Navigator Location: [facility] (09/17/19 0900) °  °)Navigator Encounter Type: Other: (CT sim) (09/17/19 0900) °  °  °  °  °  °  °  °  °  °  °Patient Visit Type: RadOnc (09/17/19 0900) °Treatment Phase: CT SIM (09/17/19 0900) °Barriers/Navigation Needs: Coordination of Care;Mobility Issues (09/17/19 0900) °  °Interventions: Coordination of Care;Referrals (09/17/19 0900) °Referrals: Rehab (09/17/19 0900) °Coordination of Care: Appts (09/17/19 0900) °   °  ° met with patient during CT sim appt today. Pt stated that recently developed right foot drop. Pt states that she is able to get around sufficiently at this time. Informed pt that can get a foot brace OTC to help keep foot flexed while walking so she does not trip. Also informed that will touch base with therapist to see if they can evaluate and treat foot drop. All questions answered during visit. Pt instructed to call with any further questions or needs. Pt verbalized understanding. Nothing further needed at this time. °  °  °  °  °  °Time Spent with Patient: 60 (09/17/19 0900) °  ° °

## 2019-09-17 NOTE — Progress Notes (Signed)
  [facility] Perioperative Services: Pre-Admission/Anesthesia Testing  Abnormal Lab Notification    Date: 09/17/19  Name: Sandra Leonard MRN:   [id number]  Re: Abnormal labs noted during PAT appointment   Provider Notified: Tyler Pita, MD Notification mode: Routed via [facility]   ABNORMAL LAB VALUE(S):  WBC 17.6 K/uL  Sodium 130 mmol/L  NOTE: Scheduled for bronchoscopy (EBUS) for biopsy of mediastinal LAD and RUL lung mass on 09/21/2019.  Honor Loh, MSN, APRN, FNP-C, CEN [facility]  Peri-operative Services Nurse Practitioner Phone: [phone] 09/17/19 9:17 AM

## 2019-09-17 NOTE — Progress Notes (Signed)
Thank you I have reviewed the note.  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  Remainder of the laboratory data is fairly close to her baseline.  Thank you for seeing her for preop anesthesia evaluation.

## 2019-09-18 DIAGNOSIS — Z510 Encounter for antineoplastic radiation therapy: Secondary | ICD-10-CM | POA: Diagnosis not present

## 2019-09-21 ENCOUNTER — Encounter: Payer: Self-pay | Admitting: Pulmonary Disease

## 2019-09-21 ENCOUNTER — Ambulatory Visit
Admission: RE | Admit: 2019-09-21 | Discharge: 2019-09-21 | Disposition: A | Discharge status: Home / Self Care | Payer: Managed Care, Other (non HMO) | Attending: Pulmonary Disease | Admitting: Pulmonary Disease

## 2019-09-21 ENCOUNTER — Ambulatory Visit: Payer: Managed Care, Other (non HMO) | Admitting: Registered Nurse

## 2019-09-21 ENCOUNTER — Ambulatory Visit: Payer: Managed Care, Other (non HMO)

## 2019-09-21 ENCOUNTER — Other Ambulatory Visit: Payer: Self-pay

## 2019-09-21 ENCOUNTER — Encounter
Admission: RE | Disposition: A | Discharge status: Home / Self Care | Payer: Self-pay | Source: Home / Self Care | Attending: Pulmonary Disease

## 2019-09-21 DIAGNOSIS — Z880 Allergy status to penicillin: Secondary | ICD-10-CM | POA: Insufficient documentation

## 2019-09-21 DIAGNOSIS — Z885 Allergy status to narcotic agent status: Secondary | ICD-10-CM | POA: Insufficient documentation

## 2019-09-21 DIAGNOSIS — Z7984 Long term (current) use of oral hypoglycemic drugs: Secondary | ICD-10-CM | POA: Diagnosis not present

## 2019-09-21 DIAGNOSIS — K219 Gastro-esophageal reflux disease without esophagitis: Secondary | ICD-10-CM | POA: Insufficient documentation

## 2019-09-21 DIAGNOSIS — E119 Type 2 diabetes mellitus without complications: Secondary | ICD-10-CM | POA: Insufficient documentation

## 2019-09-21 DIAGNOSIS — F17210 Nicotine dependence, cigarettes, uncomplicated: Secondary | ICD-10-CM | POA: Insufficient documentation

## 2019-09-21 DIAGNOSIS — R918 Other nonspecific abnormal finding of lung field: Secondary | ICD-10-CM | POA: Insufficient documentation

## 2019-09-21 DIAGNOSIS — R590 Localized enlarged lymph nodes: Secondary | ICD-10-CM

## 2019-09-21 DIAGNOSIS — C771 Secondary and unspecified malignant neoplasm of intrathoracic lymph nodes: Secondary | ICD-10-CM | POA: Diagnosis not present

## 2019-09-21 DIAGNOSIS — Z79899 Other long term (current) drug therapy: Secondary | ICD-10-CM | POA: Insufficient documentation

## 2019-09-21 DIAGNOSIS — Z882 Allergy status to sulfonamides status: Secondary | ICD-10-CM | POA: Diagnosis not present

## 2019-09-21 HISTORY — PX: VIDEO BRONCHOSCOPY WITH ENDOBRONCHIAL ULTRASOUND: SHX6177

## 2019-09-21 LAB — GLUCOSE, CAPILLARY
Glucose-Capillary: 167 mg/dL — ABNORMAL HIGH (ref 70–99)
Glucose-Capillary: 166 mg/dL — ABNORMAL HIGH (ref 70–99)

## 2019-09-21 SURGERY — BRONCHOSCOPY, WITH EBUS
Anesthesia: General

## 2019-09-21 MED ORDER — LIDOCAINE HCL (CARDIAC) PF 100 MG/5ML IV SOSY
PREFILLED_SYRINGE | INTRAVENOUS | Status: DC | PRN
  Administered 2019-09-21: 80 mg via INTRAVENOUS

## 2019-09-21 MED ORDER — ROCURONIUM BROMIDE 100 MG/10ML IV SOLN
INTRAVENOUS | Status: DC | PRN
  Administered 2019-09-21: 20 mg via INTRAVENOUS
  Administered 2019-09-21: 40 mg via INTRAVENOUS

## 2019-09-21 MED ORDER — SUGAMMADEX SODIUM 200 MG/2ML IV SOLN
INTRAVENOUS | Status: DC | PRN
  Administered 2019-09-21: 150 mg via INTRAVENOUS

## 2019-09-21 MED ORDER — PROPOFOL 10 MG/ML IV BOLUS
INTRAVENOUS | Status: AC
  Filled 2019-09-21: qty 20

## 2019-09-21 MED ORDER — FENTANYL CITRATE (PF) 100 MCG/2ML IJ SOLN
INTRAMUSCULAR | Status: AC
  Filled 2019-09-21: qty 2

## 2019-09-21 MED ORDER — SODIUM CHLORIDE 0.9 % IV SOLN: Freq: Once | INTRAVENOUS | Status: AC

## 2019-09-21 MED ORDER — SODIUM CHLORIDE 0.9 % IV SOLN
INTRAVENOUS | Status: DC | PRN
Start: 2019-09-21 — End: 2019-09-21

## 2019-09-21 MED ORDER — FENTANYL CITRATE (PF) 100 MCG/2ML IJ SOLN
INTRAMUSCULAR | Status: DC | PRN
  Administered 2019-09-21 (×2): 50 ug via INTRAVENOUS

## 2019-09-21 MED ORDER — ORAL CARE MOUTH RINSE: 15.0000 mL | Freq: Once | OROMUCOSAL | Status: AC

## 2019-09-21 MED ORDER — DEXMEDETOMIDINE HCL IN NACL 400 MCG/100ML IV SOLN
INTRAVENOUS | Status: DC | PRN
  Administered 2019-09-21: 8 ug via INTRAVENOUS

## 2019-09-21 MED ORDER — DEXAMETHASONE SODIUM PHOSPHATE 10 MG/ML IJ SOLN
INTRAMUSCULAR | Status: AC
  Filled 2019-09-21: qty 1

## 2019-09-21 MED ORDER — BUTAMBEN-TETRACAINE-BENZOCAINE 2-2-14 % EX AERO
1.0000 | INHALATION_SPRAY | Freq: Once | CUTANEOUS | Status: DC
  Filled 2019-09-21: qty 20

## 2019-09-21 MED ORDER — CHLORHEXIDINE GLUCONATE 0.12 % MT SOLN
OROMUCOSAL | Status: AC
  Administered 2019-09-21: 15 mL via OROMUCOSAL
  Filled 2019-09-21: qty 15

## 2019-09-21 MED ORDER — ONDANSETRON HCL 4 MG/2ML IJ SOLN
INTRAMUSCULAR | Status: DC | PRN
  Administered 2019-09-21: 4 mg via INTRAVENOUS

## 2019-09-21 MED ORDER — CHLORHEXIDINE GLUCONATE 0.12 % MT SOLN: 15.0000 mL | Freq: Once | OROMUCOSAL | Status: AC

## 2019-09-21 MED ORDER — ONDANSETRON HCL 4 MG/2ML IJ SOLN
INTRAMUSCULAR | Status: AC
  Filled 2019-09-21: qty 2

## 2019-09-21 MED ORDER — PHENYLEPHRINE HCL (PRESSORS) 10 MG/ML IV SOLN
INTRAVENOUS | Status: DC | PRN
  Administered 2019-09-21: 50 ug via INTRAVENOUS
  Administered 2019-09-21: 100 ug via INTRAVENOUS
  Administered 2019-09-21: 50 ug via INTRAVENOUS

## 2019-09-21 MED ORDER — PROPOFOL 10 MG/ML IV BOLUS
INTRAVENOUS | Status: DC | PRN
  Administered 2019-09-21: 150 mg via INTRAVENOUS

## 2019-09-21 MED ORDER — LIDOCAINE HCL (PF) 2 % IJ SOLN
INTRAMUSCULAR | Status: AC
  Filled 2019-09-21: qty 5

## 2019-09-21 MED ORDER — DEXAMETHASONE SODIUM PHOSPHATE 10 MG/ML IJ SOLN
INTRAMUSCULAR | Status: DC | PRN
  Administered 2019-09-21: 5 mg via INTRAVENOUS

## 2019-09-21 NOTE — Anesthesia Procedure Notes (Signed)
Procedure Name: Intubation Date/Time: 09/21/2019 1:21 PM Performed by: Jerrye Noble, CRNA Pre-anesthesia Checklist: Patient identified, Emergency Drugs available, Suction available and Patient being monitored Patient Re-evaluated:Patient Re-evaluated prior to induction Oxygen Delivery Method: Circle system utilized Preoxygenation: Pre-oxygenation with 100% oxygen Induction Type: IV induction Ventilation: Mask ventilation without difficulty Laryngoscope Size: McGraph and 4 Grade View: Grade I Tube type: Oral Tube size: 8.5 mm Number of attempts: 1 Airway Equipment and Method: Stylet and Oral airway Placement Confirmation: ETT inserted through vocal cords under direct vision,  positive ETCO2 and breath sounds checked- equal and bilateral Secured at: 23 cm Tube secured with: Tape Dental Injury: Teeth and Oropharynx as per pre-operative assessment

## 2019-09-21 NOTE — Transfer of Care (Signed)
Immediate Anesthesia Transfer of Care Note  Patient: Sandra Leonard  Procedure(s) Performed: VIDEO BRONCHOSCOPY WITH ENDOBRONCHIAL ULTRASOUND (N/A )  Patient Location: PACU  Anesthesia Type:General  Level of Consciousness: awake, alert  and oriented  Airway & Oxygen Therapy: Patient Spontanous Breathing and Patient connected to face mask oxygen  Post-op Assessment: Report given to RN and Post -op Vital signs reviewed and stable  Post vital signs: Reviewed and stable  Last Vitals:  Vitals Value Taken Time  BP 159/56 09/21/19 1433  Temp    Pulse 85 09/21/19 1436  Resp 18 09/21/19 1436  SpO2 99 % 09/21/19 1436  Vitals shown include unvalidated device data.  Last Pain:  Vitals:   09/21/19 1209  TempSrc: Temporal  PainSc: 0-No pain         Complications: No complications documented.

## 2019-09-21 NOTE — Anesthesia Preprocedure Evaluation (Signed)
Anesthesia Evaluation  Patient identified by MRN, date of birth, ID band Patient awake    Reviewed: Allergy & Precautions, H&P , NPO status , Patient's Chart, lab work & pertinent test results, reviewed documented beta blocker date and time   History of Anesthesia Complications Negative for: history of anesthetic complications  Airway Mallampati: III  TM Distance: >3 FB Neck ROM: full    Dental  (+) Dental Advidsory Given, Caps, Teeth Intact   Pulmonary neg shortness of breath, neg sleep apnea, neg COPD, neg recent URI, Current Smoker and Patient abstained from smoking.,  Lung cancer   Pulmonary exam normal breath sounds clear to auscultation       Cardiovascular Exercise Tolerance: Good negative cardio ROS Normal cardiovascular exam Rhythm:regular Rate:Normal     Neuro/Psych neg Seizures TIAnegative psych ROS   GI/Hepatic Neg liver ROS, GERD  ,  Endo/Other  diabetes  Renal/GU negative Renal ROS  negative genitourinary   Musculoskeletal   Abdominal   Peds  Hematology negative hematology ROS (+)   Anesthesia Other Findings Past Medical History: 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 No date: Brain lesion     Comment:  x3 No date: Diabetes (HCC) No date: Diabetes mellitus without complication (HCC) No date: GERD (gastroesophageal reflux disease) No date: Heartburn No date: Lesion of left femur   Reproductive/Obstetrics negative OB ROS                             Anesthesia Physical Anesthesia Plan  ASA: III  Anesthesia Plan: General   Post-op Pain Management:    Induction: Intravenous  PONV Risk Score and Plan: 2 and Ondansetron, Dexamethasone and Midazolam  Airway Management Planned: Oral ETT  Additional Equipment:   Intra-op Plan:   Post-operative Plan: Extubation in OR  Informed Consent: I have reviewed the patients History and Physical,  chart, labs and discussed the procedure including the risks, benefits and alternatives for the proposed anesthesia with the patient or authorized representative who has indicated his/her understanding and acceptance.     Dental Advisory Given  Plan Discussed with: Anesthesiologist, CRNA and Surgeon  Anesthesia Plan Comments:         Anesthesia Quick Evaluation

## 2019-09-21 NOTE — Discharge Instructions (Addendum)
Flexible Bronchoscopy, Care After This sheet gives you information about how to care for yourself after your test. Your doctor may also give you more specific instructions. If you have problems or questions, contact your doctor. Follow these instructions at home: Eating and drinking  Do not eat or drink anything (not even water) for 2 hours after your test, or until your numbing medicine (local anesthetic) wears off.  When your numbness is gone and your cough and gag reflexes have come back, you may: ? Eat only soft foods. ? Slowly drink liquids.  The day after the test, go back to your normal diet. Driving  Do not drive for 24 hours if you were given a medicine to help you relax (sedative).  Do not drive or use heavy machinery while taking prescription pain medicine. General instructions   Take over-the-counter and prescription medicines only as told by your doctor.  Return to your normal activities as told. Ask what activities are safe for you.  Do not use any products that have nicotine or tobacco in them. This includes cigarettes and e-cigarettes. If you need help quitting, ask your doctor.  Keep all follow-up visits as told by your doctor. This is important. It is very important if you had a tissue sample (biopsy) taken. Get help right away if:  You have shortness of breath that gets worse.  You get light-headed.  You feel like you are going to pass out (faint).  You have chest pain.  You cough up: ? More than a little blood. ? More blood than before. Summary  Do not eat or drink anything (not even water) for 2 hours after your test, or until your numbing medicine wears off.  Do not use cigarettes. Do not use e-cigarettes.  Get help right away if you have chest pain. This information is not intended to replace advice given to you by your health care provider. Make sure you discuss any questions you have with your health care provider. Document Revised: 01/25/2017  Document Reviewed: 03/02/2016 Elsevier Patient Education  2020 [place]   1) The drugs that you were given will stay in your system until tomorrow so for the next 24 hours you should not:  A) Drive an automobile B) Make any legal decisions C) Drink any alcoholic beverage   2) You may resume regular meals tomorrow.  Today it is better to start with liquids and gradually work up to solid foods.  You may eat anything you prefer, but it is better to start with liquids, then soup and crackers, and gradually work up to solid foods.   3) Please notify your doctor immediately if you have any unusual bleeding, trouble breathing, redness and pain at the surgery site, drainage, fever, or pain not relieved by medication.    4) Additional Instructions:        Please contact your physician with any problems or Same Day Surgery at [phone], Monday through Friday 6 am to 4 pm, or [place] at [facility] number at [phone].

## 2019-09-21 NOTE — Op Note (Signed)
PROCEDURE: BRONCHOSCOPY WITH ENDOBRONCHIAL ULTRASOUND and TBNA   PROCEDURE DATE: 09/21/2019  TIME: 1315 hrs. NAME:  Sandra Leonard  DOB:07-03-1955  MRN: [id number] LOC:  ARPO/None    HOSP DAY: N/A   Indications/Preliminary Diagnosis: RIGHT lung mass with mediastinal adenopathy  Consent: (Place X beside choice/s below)  The benefits, risks and possible complications of the procedure were        explained to:  _X__ patient  ___ patient's family  ___ other:___________  who verbalized understanding and gave:  ___ verbal  ___ written  _X__ verbal and written  ___ telephone  ___ other:________ consent.      Unable to obtain consent; procedure performed on emergent basis.     Other:    Benefits, limitations and potential complications of the procedure were discussed with the patient including, but not limited to bleeding, hemoptysis, respiratory failure requiring intubation and/or prolongued mechanical ventilation, infection, pneumothorax (collapse of lung) requiring chest tube placement, stroke from air embolism or even death.   PRESEDATION ASSESSMENT: History and Physical has been performed. Patient meds and allergies have been reviewed. Presedation airway examination has been performed and documented. Baseline vital signs, sedation score, oxygenation status, and cardiac rhythm were reviewed. Patient was deemed to be in satisfactory condition to undergo the procedure.   Surgeon: Renold Don, MD Assistant/Scrub: Sullivan Lone, RRT Anesthesiologist/CRNA: Martha Clan, MD/Crystal Clarisa Kindred, CRNA  TYPE OF ANESTHESIA: General endotracheal   Insertion Route (Place X beside choice below)   Nasal   Oral  X Endotracheal Tube   Tracheostomy   INTRAPROCEDURE MEDICATIONS: SEE ANESTHESIOLOGY RECORDS   PROCEDURE DETAILS: Patient was taken to Procedure Room #2 (Bronchoscopy Suite) where appropriate timeout was taken with the staff prior to the procedure.  The patient was inducted under general  anesthesia and intubated by anesthesiologist/CRNA team.  She was intubated with 8.5 ET tube and a Portex adapter placed on the tube.  Once patient was under adequate general anesthesia, the Olympus video therapeutic bronchoscope was introduced through the Portex adapter into the ET tube and advanced into the airway.  Anatomical tour of the airway was then done.  The visible portions of the trachea were normal, carina was sharp.  Examination of the right tracheobronchial tree showed mild "fishmouthing" of the apical subsegment of the right upper lobe.  Remainder of the right tracheobronchial tree was normal and without endobronchial lesions.  The bronchoscope was then advanced to the left tracheobronchial tree and no abnormalities were noted on careful examination.  Brushings of the apical subsegment of the right upper lobe were attempted however the brush could not be passed successfully and the area of "fishmouthing".  At this time this part of the procedure was aborted and the bronchoscope was withdrawn.  The therapeutic video bronchoscope was then exchanged for an an Olympus endobronchial ultrasound scope.  The mediastinum was examined with the ultrasound.  Right hilar adenopathy was noted however close proximity to vessels precluded safe biopsy.  The subcarinal space showed significant adenopathy/mass.  At this point the subcarinal space was sampled with a 19-gauge Olympus VIZI shot EBUS needle.  A total of 7 passes were obtained with this needle.  Atypical cells were noted on the first pass, definitive malignant cells were identified on the fifth pass. There were areas of extensive tumor necrosis.  At this point the needle was exchanged for a 21-gauge Olympus VIZI shot EBUS needle and an additional 5 passes were made with this needle and placed into CytoLyt material.  There  was minimal heme noted after the procedure.  Hemostasis was excellent.  At this point, the patient received 9 mL of 1% lidocaine anesthesia  via bronchial lavage.  And the bronchoscope was withdrawn and the procedure was terminated.  Patient was allowed to emerge from general anesthesia in the procedure room and extubated without sequela.  She was transferred to the PACU in satisfactory condition.  She tolerated the procedure well.  No overt complications noted.   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 Derrill Kay, MD [place]   *This note was dictated using voice recognition software/Dragon.  Despite best efforts to proofread, errors can occur which can change the meaning.  Any change was purely unintentional.

## 2019-09-21 NOTE — Interval H&P Note (Signed)
History and Physical Interval Note:  09/21/2019 12:53 PM  Sandra Leonard  has presented today for surgery, with the diagnosis of LUNG MASS.  The various methods of treatment have been discussed with the patient and family. After consideration of risks, benefits and other options for treatment, the patient has consented to  Procedure(s) with comments: [place] (N/A) - FLURO ON STANDBY as a surgical intervention.  The patient's history has been reviewed, patient examined, no change in status, stable for surgery.  I have reviewed the patient's chart and labs.  Questions were answered to the patient's satisfaction.     Vernard Gambles

## 2019-09-22 ENCOUNTER — Ambulatory Visit: Admission: RE | Admit: 2019-09-22 | Payer: Managed Care, Other (non HMO) | Source: Ambulatory Visit

## 2019-09-22 ENCOUNTER — Encounter: Payer: Self-pay | Admitting: Pulmonary Disease

## 2019-09-22 ENCOUNTER — Ambulatory Visit: Payer: Managed Care, Other (non HMO)

## 2019-09-22 DIAGNOSIS — Z510 Encounter for antineoplastic radiation therapy: Secondary | ICD-10-CM | POA: Diagnosis not present

## 2019-09-22 NOTE — Anesthesia Postprocedure Evaluation (Signed)
Anesthesia Post Note  Patient: Sandra Leonard  Procedure(s) Performed: VIDEO BRONCHOSCOPY WITH ENDOBRONCHIAL ULTRASOUND (N/A )  Patient location during evaluation: PACU Anesthesia Type: General Level of consciousness: awake and alert Pain management: pain level controlled Vital Signs Assessment: post-procedure vital signs reviewed and stable Respiratory status: spontaneous breathing, nonlabored ventilation, respiratory function stable and patient connected to nasal cannula oxygen Cardiovascular status: blood pressure returned to baseline and stable Postop Assessment: no apparent nausea or vomiting Anesthetic complications: no   No complications documented.   Last Vitals:  Vitals:   09/21/19 1503 09/21/19 1523  BP: (!) 139/43 (!) 157/59  Pulse: 78 86  Resp: 19 18  Temp:  36.9 C  SpO2: (!) 88% 97%    Last Pain:  Vitals:   09/21/19 1523  TempSrc: Temporal  PainSc: 0-No pain                 Martha Clan

## 2019-09-23 ENCOUNTER — Telehealth: Payer: Self-pay | Admitting: *Deleted

## 2019-09-23 ENCOUNTER — Other Ambulatory Visit: Payer: Self-pay

## 2019-09-23 ENCOUNTER — Encounter: Payer: Self-pay | Admitting: Oncology

## 2019-09-23 ENCOUNTER — Inpatient Hospital Stay (HOSPITAL_BASED_OUTPATIENT_CLINIC_OR_DEPARTMENT_OTHER): Payer: Managed Care, Other (non HMO) | Admitting: Oncology

## 2019-09-23 ENCOUNTER — Ambulatory Visit
Admission: RE | Admit: 2019-09-23 | Discharge: 2019-09-23 | Disposition: A | Discharge status: Home / Self Care | Payer: Managed Care, Other (non HMO) | Source: Ambulatory Visit | Attending: Radiation Oncology | Admitting: Radiation Oncology

## 2019-09-23 VITALS — BP 129/62 | HR 85 | Temp 97.8°F | Resp 20 | Wt 151.0 lb

## 2019-09-23 DIAGNOSIS — C3491 Malignant neoplasm of unspecified part of right bronchus or lung: Secondary | ICD-10-CM

## 2019-09-23 DIAGNOSIS — Z510 Encounter for antineoplastic radiation therapy: Secondary | ICD-10-CM | POA: Diagnosis not present

## 2019-09-23 DIAGNOSIS — C3411 Malignant neoplasm of upper lobe, right bronchus or lung: Secondary | ICD-10-CM | POA: Diagnosis not present

## 2019-09-23 MED ORDER — NICOTINE 10 MG IN INHA: 1.0000 | RESPIRATORY_TRACT | 0 refills | Status: AC | PRN

## 2019-09-23 MED ORDER — FLUCONAZOLE 150 MG PO TABS: 150.0000 mg | ORAL_TABLET | Freq: Every day | ORAL | 0 refills | Status: DC

## 2019-09-23 MED ORDER — MAGIC MOUTHWASH W/LIDOCAINE: 5.0000 mL | Freq: Four times a day (QID) | ORAL | 0 refills | Status: AC

## 2019-09-23 NOTE — Progress Notes (Signed)
Patient here today for swelling in her right ankle/foot, sores on buttocks and right elbow, and possible reoccurrence of oral thrush. She reports her foot swells from time to time and she has drop foot syndrome. She feels like steroids could be causing her skin to breakdown but says she knows they are needed.

## 2019-09-23 NOTE — Telephone Encounter (Signed)
Pt stated that she is having concerns regarding skin abrasions, new right foot swelling, and oral thrush. Per Dr. Grayland Ormond and Sonia Baller, pt needs to be seen in [facility] this afternoon for further evaluation. Pt scheduled at 1:15pm to see Sonia Baller then will have radiation treatment afterwards. Pt and clinic team made aware.

## 2019-09-23 NOTE — Progress Notes (Signed)
Symptom Management Consult note [facility]  [phone] [phone]  Patient Care Team: Barbaraann Boys, MD as PCP - General (Pediatrics) Telford Nab, RN as Oncology Nurse Navigator Grayland Ormond, Kathlene November, MD as Consulting Physician (Oncology) Tyler Pita, MD as Consulting Physician (Pulmonary Disease)   Name of the patient: Sandra Leonard  [id number]  August 08, 1955   Date of visit: 09/23/2019   Diagnosis-stage IV lung cancer with brain metastasis  Chief complaint/ Reason for visit-swelling, skin abrasion and thrush   Heme/Onc history:  Oncology History  Non-small cell carcinoma of lung, right ([place])  09/26/2019 Initial Diagnosis   Non-small cell carcinoma of lung, right ([place])   09/26/2019 Cancer Staging   Staging form: Lung, AJCC 8th Edition - Clinical stage from 09/26/2019: Stage IVB (cT4, cN3, cM1c) - Signed by Lloyd Huger, MD on 09/26/2019   10/05/2019 -  Chemotherapy   The patient had palonosetron (ALOXI) injection 0.25 mg, 0.25 mg, Intravenous,  Once, 2 of 6 cycles Administration: 0.25 mg (10/05/2019), 0.25 mg (10/26/2019) pegfilgrastim-jmdb (FULPHILA) injection 6 mg, 6 mg, Subcutaneous,  Once, 2 of 6 cycles Administration: 6 mg (10/07/2019) CARBOplatin (PARAPLATIN) 520 mg in sodium chloride 0.9 % 250 mL chemo infusion, 520 mg (100 % of original dose 516 mg), Intravenous,  Once, 2 of 6 cycles Dose modification:   (original dose 516 mg, Cycle 1) Administration: 520 mg (10/05/2019), 520 mg (10/26/2019) fosaprepitant (EMEND) 150 mg in sodium chloride 0.9 % 145 mL IVPB, 150 mg, Intravenous,  Once, 2 of 6 cycles Administration: 150 mg (10/05/2019), 150 mg (10/26/2019) PACLitaxel (TAXOL) 390 mg in sodium chloride 0.9 % 500 mL chemo infusion (> 80mg /m2), 225 mg/m2 = 390 mg, Intravenous,  Once, 2 of 6 cycles Administration: 390 mg (10/05/2019), 390 mg (10/26/2019)  for chemotherapy treatment.     Interval history-Sandra Leonard is a 64 year old  female who presented recently with left-sided numbness to her face.  Work-up in the emergency room included a CT scan which found a 3 cm right upper lobe lung lesion.  MRI of brain was completed showing brain metastasis.  PET scan confirmed hypermetabolic lymph nodes, bilateral adrenal gland metastasis and bone metastasis involving the upper left femur.  She was started on Decadron.  She recently had a bronchoscopy with Dr. Patsey Berthold which confirmed diagnosis.  She was seen by Dr. Donella Stade for consultation on 09/10/2019 and found to have oral candidiasis.  She was started on Diflucan for 7 days.  She continued Decadron for brain metastasis.  Today, patient presents to symptom management for concerns of persistent oral candidiasis, right lower extremity swelling and two skin concerns on her elbow and her buttocks.  She was treated with Diflucan x7 days by Dr. Donella Stade approximately 2 weeks ago.  Patient states she was compliant and symptoms improved but over the past 2 days have slowly started to come back.  Notes a film in her mouth with a funny taste.  Endorses intermittent right lower extremity swelling mainly around her ankle worse with activitiy.  Typically improves with rest and elevation.  No pain in her calf or knee.  Has 2 skin concerns that she noticed 2 to 3 days ago located on right elbow and on her bottom.  Right elbow injury unknown.  Denies any pain.  Noted some drainage on a pillow where she was resting her arm but otherwise would not have known.  Has used warm water and soap for cleaning and has applied Neosporin.  Skin concern on  her buttocks is only painful when using the restroom.  Patient notes an activity secondary to her diagnosis.  Does admit to a lot of "sitting in her recliner".  ECOG FS:1 - Symptomatic but completely ambulatory  Review of systems- Review of Systems  Constitutional: Positive for malaise/fatigue. Negative for chills, fever and weight loss.  HENT: Negative for  congestion, ear pain and tinnitus.   Eyes: Negative.  Negative for blurred vision and double vision.  Respiratory: Negative.  Negative for cough, sputum production and shortness of breath.   Cardiovascular: Negative.  Negative for chest pain, palpitations and leg swelling.  Gastrointestinal: Negative.  Negative for abdominal pain, constipation, diarrhea, nausea and vomiting.  Genitourinary: Negative for dysuria, frequency and urgency.  Musculoskeletal: Negative for back pain and falls.  Skin: Negative.  Negative for rash.       Skin leison-elbow and buttucks  Neurological: Negative.  Negative for weakness and headaches.  Endo/Heme/Allergies: Negative.  Does not bruise/bleed easily.  Psychiatric/Behavioral: Negative for depression. The patient has insomnia. The patient is not nervous/anxious.      Current treatment- Radiation   Allergies  Allergen Reactions  . Codeine     "makes her crazy"  . Penicillins Rash  . Sulfa Antibiotics Rash     Past Medical History:  Diagnosis Date  . Adrenal gland anomaly    bilateral lesion  . Brain lesion    x3  . Diabetes ([name])   . Diabetes mellitus without complication ([place])   . GERD (gastroesophageal reflux disease)   . Heartburn   . Lesion of left femur   . Lung cancer [facility])      Past Surgical History:  Procedure Laterality Date  . ABDOMINAL HYSTERECTOMY    . CESAREAN SECTION     x2  . PORTA CATH INSERTION N/A 10/01/2019   Procedure: PORTA CATH INSERTION;  Surgeon: Katha Cabal, MD;  Location: [place] [facility];  Service: Cardiovascular;  Laterality: N/A;  . TONSILLECTOMY    . VIDEO BRONCHOSCOPY WITH ENDOBRONCHIAL ULTRASOUND N/A 09/21/2019   Procedure: VIDEO BRONCHOSCOPY WITH ENDOBRONCHIAL ULTRASOUND;  Surgeon: Tyler Pita, MD;  Location: [facility] ORS;  Service: Pulmonary;  Laterality: N/A;  FLURO ON STANDBY    Social History   Socioeconomic History  . Marital status: Legally Separated    Spouse name: Not on file  .  Number of children: Not on file  . Years of education: Not on file  . Highest education level: Not on file  Occupational History  . Not on file  Tobacco Use  . Smoking status: Current Every Day Smoker    Packs/day: 1.00    Years: 40.00    Pack years: 40.00  . Smokeless tobacco: Never Used  . Tobacco comment: 7-8 cigarettes a day--10/13/2019  Vaping Use  . Vaping Use: Never used  Substance and Sexual Activity  . Alcohol use: No    Alcohol/week: 0.0 standard drinks  . Drug use: No  . Sexual activity: Not on file  Other Topics Concern  . Not on file  Social History Narrative  . Not on file   Social Determinants of Health   Financial Resource Strain:   . Difficulty of Paying Living Expenses: Not on file  Food Insecurity:   . Worried About Charity fundraiser in the Last Year: Not on file  . Ran Out of Food in the Last Year: Not on file  Transportation Needs:   . Lack of Transportation (Medical): Not on file  .  Lack of Transportation (Non-Medical): Not on file  Physical Activity:   . Days of Exercise per Week: Not on file  . Minutes of Exercise per Session: Not on file  Stress:   . Feeling of Stress : Not on file  Social Connections:   . Frequency of Communication with Friends and Family: Not on file  . Frequency of Social Gatherings with Friends and Family: Not on file  . Attends Religious Services: Not on file  . Active Member of Clubs or Organizations: Not on file  . Attends Archivist Meetings: Not on file  . Marital Status: Not on file  Intimate Partner Violence:   . Fear of Current or Ex-Partner: Not on file  . Emotionally Abused: Not on file  . Physically Abused: Not on file  . Sexually Abused: Not on file    Family History  Problem Relation Age of Onset  . Breast cancer Mother 68  . Diabetes Mother   . Hypertension Mother   . Hypertension Father   . Diabetes Sister   . Hypertension Sister   . Diabetes Brother      Current Outpatient  Medications:  .  atorvastatin (LIPITOR) 10 MG tablet, Take 10 mg by mouth daily., Disp: , Rfl:  .  dexamethasone (DECADRON) 4 MG tablet, Take 1 tablet (4 mg total) by mouth in the morning and at bedtime., Disp: 60 tablet, Rfl: 0 .  diphenhydrAMINE (BENADRYL ALLERGY) 25 MG tablet, Take 25 mg by mouth at bedtime as needed for sleep. , Disp: , Rfl:  .  empagliflozin (JARDIANCE) 10 MG TABS tablet, Take 10 mg by mouth at bedtime. , Disp: , Rfl:  .  esomeprazole (NEXIUM) 40 MG capsule, Take 40 mg by mouth daily., Disp: , Rfl:  .  lisinopril (ZESTRIL) 5 MG tablet, Take 5 mg by mouth daily. , Disp: , Rfl:  .  metFORMIN (GLUCOPHAGE) 1000 MG tablet, Take 1 tablet (1,000 mg total) by mouth 2 (two) times daily with a meal., Disp: 180 tablet, Rfl: 3 .  omeprazole (PRILOSEC) 20 MG capsule, TAKE 1 CAPSULE (20 MG TOTAL) BY MOUTH DAILY., Disp: 30 capsule, Rfl: 6 .  ONE TOUCH ULTRA TEST test strip, TEST BLOOD SUGAR IN THE MORNING AND 2 HOURS AFTER LARGEST MEAL, Disp: 50 each, Rfl: 2 .  sertraline (ZOLOFT) 50 MG tablet, Take half a tablet (25mg ) by mouth daily x 1 week and then increase to one tablet (50mg ) daily (Patient taking differently: Take 50 mg by mouth daily. ), Disp: 30 tablet, Rfl: 2 .  fluconazole (DIFLUCAN) 150 MG tablet, Take 1 tablet (150 mg total) by mouth daily for 7 days., Disp: 7 tablet, Rfl: 0 .  HYDROcodone-acetaminophen (NORCO) 5-325 MG tablet, Take 1 tablet by mouth every 6 (six) hours as needed for moderate pain., Disp: 30 tablet, Rfl: 0 .  lidocaine-prilocaine (EMLA) cream, Apply to affected area once, Disp: 30 g, Rfl: 3 .  magic mouthwash w/lidocaine SOLN, Take 5 mLs by mouth 4 (four) times daily., Disp: 240 mL, Rfl: 0 .  megestrol (MEGACE) 40 MG tablet, Take 1 tablet (40 mg total) by mouth daily., Disp: 30 tablet, Rfl: 2 .  nicotine (NICODERM CQ - DOSED IN MG/24 HOURS) 21 mg/24hr patch, Place onto the skin., Disp: , Rfl:  .  nicotine (NICOTROL) 10 MG inhaler, Inhale 1 Cartridge (1 continuous  puffing total) into the lungs as needed for smoking cessation., Disp: 42 each, Rfl: 0 .  ondansetron (ZOFRAN) 8 MG tablet, Take 1  tablet (8 mg total) by mouth 2 (two) times daily as needed for refractory nausea / vomiting., Disp: 60 tablet, Rfl: 2 .  prochlorperazine (COMPAZINE) 10 MG tablet, Take 1 tablet (10 mg total) by mouth every 6 (six) hours as needed (Nausea or vomiting)., Disp: 60 tablet, Rfl: 2 .  traZODone (DESYREL) 100 MG tablet, Take 1-2 tablets (100-200 mg total) by mouth at bedtime as needed for sleep., Disp: 60 tablet, Rfl: 2  Physical exam:  Vitals:   09/23/19 1327  BP: (!) 129/62  Pulse: 85  Resp: 20  Temp: 97.8 F (36.6 C)  TempSrc: Tympanic  SpO2: 99%  Weight: 151 lb (68.5 kg)   Physical Exam Constitutional:      Appearance: Normal appearance.  HENT:     Head: Normocephalic and atraumatic.  Eyes:     Pupils: Pupils are equal, round, and reactive to light.  Cardiovascular:     Rate and Rhythm: Normal rate and regular rhythm.     Heart sounds: Normal heart sounds. No murmur heard.   Pulmonary:     Effort: Pulmonary effort is normal.     Breath sounds: Normal breath sounds. No wheezing.  Abdominal:     General: Bowel sounds are normal. There is no distension.     Palpations: Abdomen is soft.     Tenderness: There is no abdominal tenderness.  Musculoskeletal:        General: Normal range of motion.     Cervical back: Normal range of motion.  Skin:    General: Skin is warm and dry.     Findings: Abrasion (Right Elbow and right gluteal fold) present. No rash.  Neurological:     Mental Status: She is alert and oriented to person, place, and time.  Psychiatric:        Judgment: Judgment normal.      CMP Latest Ref Rng & Units 10/26/2019  Glucose 70 - 99 mg/dL 159(H)  BUN 8 - 23 mg/dL 15  Creatinine 0.44 - 1.00 mg/dL 0.37(L)  Sodium 135 - 145 mmol/L 136  Potassium 3.5 - 5.1 mmol/L 3.1(L)  Chloride 98 - 111 mmol/L 101  CO2 22 - 32 mmol/L 24  Calcium  8.9 - 10.3 mg/dL 8.3(L)  Total Protein 6.5 - 8.1 g/dL 6.4(L)  Total Bilirubin 0.3 - 1.2 mg/dL 0.6  Alkaline Phos 38 - 126 U/L 97  AST 15 - 41 U/L 13(L)  ALT 0 - 44 U/L 15   CBC Latest Ref Rng & Units 10/26/2019  WBC 4.0 - 10.5 K/uL 22.8(H)  Hemoglobin 12.0 - 15.0 g/dL 13.4  Hematocrit 36 - 46 % 38.9  Platelets 150 - 400 K/uL 433(H)    No images are attached to the encounter.  No results found.   Assessment and plan- Patient is a 64 y.o. female who presents to symptom management for concerns of persistent thrush and right lower extremity swelling along with 2 skin concerns.  Metastatic lung cancer-currently undergoing radiation.  Will begin carbotaxol on 10/05/2019.  Oral thrush-secondary to steroids for brain metastasis.  Previously treated with Diflucan x7 days per Dr. Donella Stade with recurrence.  Can try nystatin swish and swallow along with a refill of Magic mouthwash.  Prescription sent.   Bilateral lower extremity swelling-suggest elevation and TED hose.  Continue to monitor  Excoriation to right elbow-unknown cause.  Possibly secondary to pressure.  Will clean and apply Allevyn today in clinic.  Recommend to keep clean and dry.  Try not to apply pressure.  Moisture associated dermatitis on right gluteal fold-secondary to decreased mobility.  Recommend position changes.  Allevyn to bottom to prevent further damage of tissue.  Continue to monitor skin concerns.  Given history of diabetes slow wound healing.  Monitor for infection.  Plan- Nystatin swish and swallow Refilled Magic mouthwash Clean and dry wound-apply Allevyn  Disposition- Follow-up as planned.  Visit Diagnosis 1. Non-small cell carcinoma of lung, right [facility])     Patient expressed understanding and was in agreement with this plan. She also understands that She can call clinic at any time with any questions, concerns, or complaints.   Greater than 50% was spent in counseling and coordination of care with this  patient including but not limited to discussion of the relevant topics above (See A&P) including, but not limited to diagnosis and management of acute and chronic medical conditions.   Thank you for allowing me to participate in the care of this very pleasant patient.    Jacquelin Hawking, NP [place] [facility] Cell - [phone] Pager- [phone] 11/06/2019 10:54 AM

## 2019-09-24 ENCOUNTER — Ambulatory Visit
Admission: RE | Admit: 2019-09-24 | Discharge: 2019-09-24 | Disposition: A | Discharge status: Home / Self Care | Payer: Managed Care, Other (non HMO) | Source: Ambulatory Visit | Attending: Radiation Oncology | Admitting: Radiation Oncology

## 2019-09-24 ENCOUNTER — Inpatient Hospital Stay (HOSPITAL_BASED_OUTPATIENT_CLINIC_OR_DEPARTMENT_OTHER): Payer: Managed Care, Other (non HMO) | Admitting: Hospice and Palliative Medicine

## 2019-09-24 ENCOUNTER — Other Ambulatory Visit: Payer: Self-pay | Admitting: Anatomic Pathology & Clinical Pathology

## 2019-09-24 DIAGNOSIS — R918 Other nonspecific abnormal finding of lung field: Secondary | ICD-10-CM

## 2019-09-24 DIAGNOSIS — Z515 Encounter for palliative care: Secondary | ICD-10-CM | POA: Diagnosis not present

## 2019-09-24 DIAGNOSIS — F418 Other specified anxiety disorders: Secondary | ICD-10-CM | POA: Diagnosis not present

## 2019-09-24 DIAGNOSIS — G4700 Insomnia, unspecified: Secondary | ICD-10-CM

## 2019-09-24 DIAGNOSIS — G893 Neoplasm related pain (acute) (chronic): Secondary | ICD-10-CM | POA: Diagnosis not present

## 2019-09-24 DIAGNOSIS — Z79899 Other long term (current) drug therapy: Secondary | ICD-10-CM

## 2019-09-24 DIAGNOSIS — Z510 Encounter for antineoplastic radiation therapy: Secondary | ICD-10-CM | POA: Diagnosis not present

## 2019-09-24 DIAGNOSIS — F3289 Other specified depressive episodes: Secondary | ICD-10-CM

## 2019-09-24 LAB — CYTOLOGY - NON PAP

## 2019-09-24 MED ORDER — TRAZODONE HCL 100 MG PO TABS: 100.0000 mg | ORAL_TABLET | Freq: Every evening | ORAL | 2 refills | Status: AC | PRN

## 2019-09-24 NOTE — Progress Notes (Signed)
Virtual Visit via Telephone Note  I connected with Sandra Leonard on 09/24/19 at  1:15 PM EDT by telemedicine and verified that I am speaking with the correct person using two identifiers.   I discussed the limitations of evaluation and management by telemedicine and the availability of in person appointments. The patient expressed understanding and agreed to proceed.  History of Present Illness: 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  Patient initially presented with left facial numbness.  Work-up including CT and MRI revealed a 3 cm right upper lobe lung lesion and at least 3 brain metastases.  PET/CT revealed hypermetabolic right upper lobe lung mass invading the mediastinum with associated right hilar, infrahilar, and subcarinal metastatic adenopathy.  PET also revealed hypermetabolic epicardial lymph nodes, bilateral adrenal gland metastasis, and bone metastasis in the left femur.  Patient is being referred to pulmonary for consideration of bronchoscopy for tissue diagnosis.  Patient saw Dr. Baruch Gouty on 09/10/2019 and is receiving whole brain radiation and XRT to her left femur.   Observations/Objective: I spoke with patient by phone.  It sounds like overall patient is doing better. She describes less sadness/anxiety on Zoloft 50 mg daily. She does report periods of agitation after she takes her dexamethasone, however, there is plan to wean steroids in the coming weeks.  Patient says her pain is persistent in the hip requiring use of Norco for breakthrough pain. Patient reports feeling that the pain was better controlled on diclofenac 75 mg daily. However, I explained that I would not recommend concurrent use of NSAID while on steroids. Could consider resuming diclofenac after dexamethasone is weaned.  Patient says that her sleep was better after taking 150 mg of trazodone at bedtime. She  requests a refill at a higher dose.   Assessment and Plan: Stage IV lung cancer -patient is status post bronc on 7/26. Cytology was positive for non-small cell lung cancer. Patient has follow-up visit with Dr. Grayland Ormond on 8/2 to discuss treatment options.  Neoplasm related pain - Hopefully, patient's pain will improve following through XRT. Continue dexamethasone with Norco for breakthrough pain. Would consider use of diclofenac after discontinuation of dexamethasone.  Depression/Anxiety -continue Zoloft 50 mg daily  Insomnia -increase trazodone 100 to 200 mg nightly  Follow Up Instructions: MyChart visit in 1 month   I discussed the assessment and treatment plan with the patient. The patient was provided an opportunity to ask questions and all were answered. The patient agreed with the plan and demonstrated an understanding of the instructions.   The patient was advised to call back or seek an in-person evaluation if the symptoms worsen or if the condition fails to improve as anticipated.  I provided 10 minutes of non-face-to-face time during this encounter.   Irean Hong, NP

## 2019-09-25 ENCOUNTER — Ambulatory Visit
Admission: RE | Admit: 2019-09-25 | Discharge: 2019-09-25 | Disposition: A | Discharge status: Home / Self Care | Payer: Managed Care, Other (non HMO) | Source: Ambulatory Visit | Attending: Radiation Oncology | Admitting: Radiation Oncology

## 2019-09-25 ENCOUNTER — Other Ambulatory Visit: Payer: Self-pay

## 2019-09-25 ENCOUNTER — Inpatient Hospital Stay: Payer: Managed Care, Other (non HMO) | Admitting: Oncology

## 2019-09-25 ENCOUNTER — Inpatient Hospital Stay: Payer: Managed Care, Other (non HMO)

## 2019-09-25 ENCOUNTER — Encounter: Payer: Self-pay | Admitting: *Deleted

## 2019-09-25 VITALS — BP 119/48 | HR 75 | Temp 95.4°F | Resp 18 | Wt 149.8 lb

## 2019-09-25 DIAGNOSIS — Z7189 Other specified counseling: Secondary | ICD-10-CM

## 2019-09-25 DIAGNOSIS — C3490 Malignant neoplasm of unspecified part of unspecified bronchus or lung: Secondary | ICD-10-CM

## 2019-09-25 DIAGNOSIS — C3491 Malignant neoplasm of unspecified part of right bronchus or lung: Secondary | ICD-10-CM | POA: Diagnosis not present

## 2019-09-25 DIAGNOSIS — Z510 Encounter for antineoplastic radiation therapy: Secondary | ICD-10-CM | POA: Diagnosis not present

## 2019-09-25 DIAGNOSIS — C3411 Malignant neoplasm of upper lobe, right bronchus or lung: Secondary | ICD-10-CM | POA: Diagnosis not present

## 2019-09-25 NOTE — Progress Notes (Signed)
  Oncology Nurse Navigator Documentation  Navigator Location: [facility]Med Onc (09/25/19 1300)   )Navigator Encounter Type: Follow-up Appt (09/25/19 1300)     Confirmed Diagnosis Date: 09/24/19 (09/25/19 1300)             Treatment Initiated Date: 09/23/19 (09/25/19 1300) Patient Visit Type: MedOnc (09/25/19 1300) Treatment Phase: Pre-Tx/Tx Discussion (09/25/19 1300) Barriers/Navigation Needs: Coordination of Care;Education (09/25/19 1300) Education: Newly Diagnosed Cancer Education;Understanding Cancer/ Treatment Options (09/25/19 1300) Interventions: Coordination of Care;Education (09/25/19 1300)   Coordination of Care: Appts;Chemo (09/25/19 1300) Education Method: Verbal;Written (09/25/19 1300)       met with patient during follow up visit with Dr. Grayland Ormond. All questions answered during visit. Pt given resources regarding diagnosis and supportive services available. Reviewed upcoming appts. Pt instructed to call with any further questions or needs. Pt verbalized understanding.          Time Spent with Patient: 60 (09/25/19 1300)

## 2019-09-26 DIAGNOSIS — Z7189 Other specified counseling: Secondary | ICD-10-CM | POA: Insufficient documentation

## 2019-09-26 DIAGNOSIS — C3491 Malignant neoplasm of unspecified part of right bronchus or lung: Secondary | ICD-10-CM | POA: Insufficient documentation

## 2019-09-26 MED ORDER — PROCHLORPERAZINE MALEATE 10 MG PO TABS: 10.0000 mg | ORAL_TABLET | Freq: Four times a day (QID) | ORAL | 2 refills | Status: AC | PRN

## 2019-09-26 MED ORDER — ONDANSETRON HCL 8 MG PO TABS: 8.0000 mg | ORAL_TABLET | Freq: Two times a day (BID) | ORAL | 2 refills | Status: AC | PRN

## 2019-09-26 MED ORDER — LIDOCAINE-PRILOCAINE 2.5-2.5 % EX CREA: TOPICAL_CREAM | CUTANEOUS | 3 refills | Status: AC

## 2019-09-26 NOTE — Progress Notes (Signed)
[place]  [phone] [phone]  ID: Sandra Leonard OB: 21-Apr-1955  MR#: [id number]  WIO#:[id number]  Patient Care Team: Barbaraann Boys, MD as PCP - General (Pediatrics) Telford Nab, RN as Oncology Nurse Navigator Grayland Ormond, Kathlene November, MD as Consulting Physician (Oncology)  CHIEF COMPLAINT: Stage IVb non-small cell lung cancer, NOS.  INTERVAL HISTORY: Patient returns to clinic today for further evaluation, discussion of her imaging and pathology results, and treatment planning.  Her facial numbness has improved, but she is now developed left foot drop.  She is tolerating her cranial XRT well.  She has no other neurologic complaints.  She denies any recent fevers or illnesses.  She has a good appetite and denies weight loss.  She has no chest pain, shortness of breath, cough, or hemoptysis.  She denies any nausea, vomiting, constipation, or diarrhea.  She has no melena or hematochezia.  She has no urinary complaints.  Patient offers no further specific complaints today.  REVIEW OF SYSTEMS:   Review of Systems  Constitutional: Negative.  Negative for fever, malaise/fatigue and weight loss.  Respiratory: Negative.  Negative for cough, hemoptysis and shortness of breath.   Cardiovascular: Negative.  Negative for chest pain and leg swelling.  Gastrointestinal: Negative.  Negative for abdominal pain.  Genitourinary: Negative.  Negative for dysuria.  Musculoskeletal: Negative.  Negative for back pain.  Skin: Negative.  Negative for rash.  Neurological: Positive for tingling and focal weakness. Negative for dizziness, speech change, weakness and headaches.  Psychiatric/Behavioral: Negative.  The patient is not nervous/anxious.     As per HPI. Otherwise, a complete review of systems is negative.  PAST MEDICAL HISTORY: Past Medical History:  Diagnosis Date  . Adrenal gland anomaly    bilateral lesion  . Brain lesion    x3  . Diabetes ([place])   .  Diabetes mellitus without complication ([place])   . GERD (gastroesophageal reflux disease)   . Heartburn   . Lesion of left femur     PAST SURGICAL HISTORY: Past Surgical History:  Procedure Laterality Date  . ABDOMINAL HYSTERECTOMY    . TONSILLECTOMY    . VIDEO BRONCHOSCOPY WITH ENDOBRONCHIAL ULTRASOUND N/A 09/21/2019   Procedure: VIDEO BRONCHOSCOPY WITH ENDOBRONCHIAL ULTRASOUND;  Surgeon: Tyler Pita, MD;  Location: [facility] ORS;  Service: Pulmonary;  Laterality: N/A;  FLURO ON STANDBY    FAMILY HISTORY: Family History  Problem Relation Age of Onset  . Breast cancer Mother 35  . Diabetes Mother   . Hypertension Mother   . Hypertension Father   . Diabetes Sister   . Hypertension Sister   . Diabetes Brother     ADVANCED DIRECTIVES (Y/N):  N  HEALTH MAINTENANCE: Social History   Tobacco Use  . Smoking status: Current Every Day Smoker    Packs/day: 1.00    Years: 40.00    Pack years: 40.00  . Smokeless tobacco: Never Used  Vaping Use  . Vaping Use: Never used  Substance Use Topics  . Alcohol use: No    Alcohol/week: 0.0 standard drinks  . Drug use: No     Colonoscopy:  PAP:  Bone density:  Lipid panel:  Allergies  Allergen Reactions  . Codeine     "makes her crazy"  . Penicillins Rash  . Sulfa Antibiotics Rash    Current Outpatient Medications  Medication Sig Dispense Refill  . atorvastatin (LIPITOR) 10 MG tablet Take 10 mg by mouth daily.    Marland Kitchen dexamethasone (DECADRON) 4 MG tablet Take  1 tablet (4 mg total) by mouth in the morning and at bedtime. 60 tablet 0  . diphenhydrAMINE (BENADRYL ALLERGY) 25 MG tablet Take 25 mg by mouth at bedtime as needed for sleep.     . empagliflozin (JARDIANCE) 10 MG TABS tablet Take 10 mg by mouth in the morning and at bedtime.    Marland Kitchen esomeprazole (NEXIUM) 40 MG capsule Take 40 mg by mouth daily.    . fluconazole (DIFLUCAN) 100 MG tablet Take 1 tablet (100 mg total) by mouth daily. 7 tablet 0  . fluconazole (DIFLUCAN) 150 MG  tablet Take 1 tablet (150 mg total) by mouth daily. 10 tablet 0  . HYDROcodone-acetaminophen (NORCO) 5-325 MG tablet Take 1 tablet by mouth every 6 (six) hours as needed for moderate pain. 30 tablet 0  . lisinopril (ZESTRIL) 5 MG tablet Take 5 mg by mouth daily.    . magic mouthwash w/lidocaine SOLN Take 5 mLs by mouth 4 (four) times daily. 240 mL 0  . metFORMIN (GLUCOPHAGE) 1000 MG tablet Take 1 tablet (1,000 mg total) by mouth 2 (two) times daily with a meal. 180 tablet 3  . nicotine (NICODERM CQ - DOSED IN MG/24 HOURS) 21 mg/24hr patch Place onto the skin.    . nicotine (NICOTROL) 10 MG inhaler Inhale 1 Cartridge (1 continuous puffing total) into the lungs as needed for smoking cessation. 42 each 0  . omeprazole (PRILOSEC) 20 MG capsule TAKE 1 CAPSULE (20 MG TOTAL) BY MOUTH DAILY. 30 capsule 6  . ONE TOUCH ULTRA TEST test strip TEST BLOOD SUGAR IN THE MORNING AND 2 HOURS AFTER LARGEST MEAL 50 each 2  . sertraline (ZOLOFT) 50 MG tablet Take half a tablet (22m) by mouth daily x 1 week and then increase to one tablet (5422m daily (Patient taking differently: Take 25-50 mg by mouth See admin instructions. Take 2 22m18my mouth daily x 1 week and then increase to 50 mg daily) 30 tablet 2  . traZODone (DESYREL) 100 MG tablet Take 1-2 tablets (100-200 mg total) by mouth at bedtime as needed for sleep. 60 tablet 2   No current facility-administered medications for this visit.    OBJECTIVE: Vitals:   09/25/19 1011  BP: (!) 119/48  Pulse: 75  Resp: 18  Temp: (!) 95.4 F (35.2 C)  SpO2: 100%     Body mass index is 27.4 kg/m.    ECOG FS:0 - Asymptomatic  General: Well-developed, well-nourished, no acute distress. Eyes: Pink conjunctiva, anicteric sclera. HEENT: Normocephalic, moist mucous membranes. Lungs: No audible wheezing or coughing. Heart: Regular rate and rhythm. Abdomen: Soft, nontender, no obvious distention. Musculoskeletal: No edema, cyanosis, or clubbing. Neuro: Alert, answering  all questions appropriately. Cranial nerves grossly intact.  Left foot drop. Skin: No rashes or petechiae noted. Psych: Normal affect.  LAB RESULTS:  Lab Results  Component Value Date   NA 130 (L) 09/17/2019   K 4.3 09/17/2019   CL 94 (L) 09/17/2019   CO2 24 09/17/2019   GLUCOSE 140 (H) 09/17/2019   BUN 25 (H) 09/17/2019   CREATININE 0.43 (L) 09/17/2019   CALCIUM 8.8 (L) 09/17/2019   PROT 6.7 10/09/2017   ALBUMIN 4.2 10/09/2017   AST 24 10/09/2017   ALT 20 10/09/2017   ALKPHOS 110 10/09/2017   BILITOT 0.3 10/09/2017   GFRNONAA >60 09/17/2019   GFRAA >60 09/17/2019    Lab Results  Component Value Date   WBC 17.6 (H) 09/17/2019   NEUTROABS 7.9 (H) 02/28/2017   HGB  15.7 (H) 09/17/2019   HCT 44.2 09/17/2019   MCV 86.7 09/17/2019   PLT 344 09/17/2019     STUDIES: CT CHEST W CONTRAST  Result Date: 08/27/2019 CLINICAL DATA:  History of metastatic carcinoma to the brain on recent CT of the head. Check for primary EXAM: CT CHEST, ABDOMEN, AND PELVIS WITH CONTRAST TECHNIQUE: Multidetector CT imaging of the chest, abdomen and pelvis was performed following the standard protocol during bolus administration of intravenous contrast. CONTRAST:  21m OMNIPAQUE IOHEXOL 300 MG/ML  SOLN COMPARISON:  None. FINDINGS: CT CHEST FINDINGS Cardiovascular: Thoracic aorta and its branches demonstrate atherosclerotic calcifications without aneurysmal dilatation or dissection. Diffuse mural thrombus is noted in the descending thoracic aorta. No cardiac enlargement is seen. Coronary calcifications are noted. The pulmonary artery shows no definitive emboli. Mediastinum/Nodes: Thoracic inlet is within normal limits. There are scattered mediastinal and hilar lymph nodes which demonstrates central decreased attenuation consistent with necrosis. The largest of the mediastinal nodes lies in the subcarinal region measuring approximately 18 mm in short axis. The largest of the hilar nodes is noted measuring 13 mm in  short axis. Scattered smaller right hilar nodes are seen. The esophagus is within normal limits. Lungs/Pleura: The lungs are well aerated bilaterally. There is a right upper lobe mass lesion abutting the mediastinum which demonstrates peripheral spiculation and measures approximately 2.9 by 3.1 cm in greatest AP and transverse dimensions respectively. It demonstrates central necrosis and is consistent with a primary pulmonary neoplasm and likely the etiology of the patient's known intracranial metastatic disease. A 5 mm nodule is noted in the right lower lobe on image number 112 of series 3 with a smaller right lower lobe nodule on image number 105 of series 3. No sizable effusion is noted. No other parenchymal nodules are seen. Musculoskeletal: Degenerative changes of the thoracic spine are noted. CT ABDOMEN PELVIS FINDINGS Hepatobiliary: Fatty infiltration of the liver is noted. The gallbladder is within normal limits. Pancreas: Unremarkable. No pancreatic ductal dilatation or surrounding inflammatory changes. Spleen: Normal in size without focal abnormality. Adrenals/Urinary Tract: The adrenal glands demonstrates small lesions bilaterally measuring almost 14 mm on the right and approximately 18 mm on the left. The findings in the chest, these are suspicious for adrenal metastasis. Renal cysts are seen. No calculi are noted. No obstructive changes are seen. The bladder is partially distended. Stomach/Bowel: Scattered diverticular change of the colon is noted without evidence of diverticulitis. The appendix is within normal limits. No small bowel or gastric abnormality is noted. Vascular/Lymphatic: Aortic atherosclerosis. Considerable noncalcified plaque/mural thrombus is noted in the infrarenal aorta. No definitive aneurysmal dilatation is seen. No enlarged abdominal or pelvic lymph nodes. Reproductive: Status post hysterectomy. No adnexal masses. Other: No abdominal wall hernia or abnormality. No abdominopelvic  ascites. Musculoskeletal: Degenerative changes of lumbar spine are seen. No definitive lytic lesions are noted. Chronic endplate deformity at the superior endplate of L1 is noted. IMPRESSION: Spiculated mass lesion in the right upper lobe with associated right hilar and mediastinal adenopathy consistent with primary pulmonary neoplasm. Two small nodules are noted in the right lower lobe. These likely represent small intrapulmonary metastatic lesions. Further workup is recommended to include tissue sampling and likely PET-CT. Lesions within the adrenal glands bilaterally suspicious for metastatic disease. Chronic changes in the abdomen. Aortic Atherosclerosis (ICD10-I70.0). These results will be called to the ordering clinician or representative by the Radiologist Assistant, and communication documented in the PACS or [employer] Electronically Signed   By: MLinus MakoD.  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 Check for primary EXAM: CT CHEST, ABDOMEN, AND PELVIS WITH CONTRAST TECHNIQUE: Multidetector CT imaging of the chest, abdomen and pelvis was performed following the standard protocol during bolus administration of intravenous contrast. CONTRAST:  37m OMNIPAQUE IOHEXOL 300 MG/ML  SOLN COMPARISON:  None. FINDINGS: CT CHEST FINDINGS Cardiovascular: Thoracic aorta and its branches demonstrate atherosclerotic calcifications without aneurysmal dilatation or dissection. Diffuse mural thrombus is noted in the descending thoracic aorta. No cardiac enlargement is seen. Coronary calcifications are noted. The pulmonary artery shows no definitive emboli. Mediastinum/Nodes: Thoracic inlet is within normal limits. There are scattered mediastinal and hilar lymph nodes which demonstrates central decreased attenuation consistent with necrosis. The largest of the mediastinal nodes lies in the  subcarinal region measuring approximately 18 mm in short axis. The largest of the hilar nodes is noted measuring 13 mm in short axis. Scattered smaller right hilar nodes are seen. The esophagus is within normal limits. Lungs/Pleura: The lungs are well aerated bilaterally. There is a right upper lobe mass lesion abutting the mediastinum which demonstrates peripheral spiculation and measures approximately 2.9 by 3.1 cm in greatest AP and transverse dimensions respectively. It demonstrates central necrosis and is consistent with a primary pulmonary neoplasm and likely the etiology of the patient's known intracranial metastatic disease. A 5 mm nodule is noted in the right lower lobe on image number 112 of series 3 with a smaller right lower lobe nodule on image number 105 of series 3. No sizable effusion is noted. No other parenchymal nodules are seen. Musculoskeletal: Degenerative changes of the thoracic spine are noted. CT ABDOMEN PELVIS FINDINGS Hepatobiliary: Fatty infiltration of the liver is noted. The gallbladder is within normal limits. Pancreas: Unremarkable. No pancreatic ductal dilatation or surrounding inflammatory changes. Spleen: Normal in size without focal abnormality. Adrenals/Urinary Tract: The adrenal glands demonstrates small lesions bilaterally measuring almost 14 mm on the right and approximately 18 mm on the left. The findings in the chest, these are suspicious for adrenal metastasis. Renal cysts are seen. No calculi are noted. No obstructive changes are seen. The bladder is partially distended. Stomach/Bowel: Scattered diverticular change of the colon is noted without evidence of diverticulitis. The appendix is within normal limits. No small bowel or gastric abnormality is noted. Vascular/Lymphatic: Aortic atherosclerosis. Considerable noncalcified plaque/mural thrombus is noted in the infrarenal aorta. No definitive aneurysmal dilatation is seen. No enlarged abdominal or pelvic lymph nodes.  Reproductive: Status post hysterectomy. No adnexal masses. Other: No abdominal wall hernia or abnormality. No abdominopelvic ascites. Musculoskeletal: Degenerative changes of lumbar spine are seen. No definitive lytic lesions are noted. Chronic endplate deformity at the superior endplate of L1 is noted. IMPRESSION: Spiculated mass lesion in the right upper lobe with associated right hilar and mediastinal adenopathy consistent with primary pulmonary neoplasm. Two small nodules are noted in the right lower lobe. These likely represent small intrapulmonary metastatic lesions. Further workup is recommended to include tissue sampling and likely PET-CT. Lesions within the adrenal glands bilaterally suspicious for metastatic disease. Chronic changes in the abdomen. Aortic Atherosclerosis (ICD10-I70.0). These results will be called to the ordering clinician or representative by the Radiologist Assistant, and communication documented in the PACS or [employer] Electronically Signed   By: MInez CatalinaM.D.   On: 08/27/2019 14:17   NM PET Image Initial (PI) Skull Base To Thigh  Result  Date: 09/07/2019 CLINICAL DATA:  Initial treatment strategy for right upper lobe lung mass and metastatic disease. EXAM: NUCLEAR MEDICINE PET SKULL BASE TO THIGH TECHNIQUE: 7.8 mCi F-18 FDG was injected intravenously. Full-ring PET imaging was performed from the skull base to thigh after the radiotracer. CT data was obtained and used for attenuation correction and anatomic localization. Fasting blood glucose: 118 mg/dl COMPARISON:  CT scan 08/27/2019 FINDINGS: Mediastinal blood pool activity: SUV max 1.89 Liver activity: SUV max NA NECK: No hypermetabolic lymph nodes in the neck. Incidental CT findings: Bilateral carotid artery calcifications. CHEST: Spiculated central right upper lobe lung lesion measuring 2.7 cm is directly invading the mediastinum. This has an SUV max of 6.37 and is consistent with neoplasm. There is also right  hilar, right infrahilar and subcarinal lymphadenopathy. The subcarinal lesion measures 15 mm and the SUV max is 8.50. Right hilar adenopathy has an SUV max of 5.03. Small scattered pulmonary nodules appears stable and are indeterminate but certainly worrisome for metastatic disease. The largest nodules in the right lower lobe and measures 5 mm. Two lower epicardial lymph nodes are hypermetabolic and consistent with nodal disease. The larger more medial lesion measures 11.5 mm and has an SUV max of 5.22. Incidental CT findings: Stable age advanced atherosclerotic calcifications involving the aorta and branch vessels including three-vessel coronary artery calcifications. ABDOMEN/PELVIS: Bilateral adrenal gland lesions are hypermetabolic and consistent with metastatic disease. The left adrenal gland lesion measures 16 mm and the SUV max is 9.13. The right adrenal gland lesion measures 10 mm and the SUV max is 4.03. No findings for hepatic metastatic disease. No enlarged or hypermetabolic mesenteric or retroperitoneal adenopathy. Incidental CT findings: Advanced atherosclerotic calcifications involving the aorta and iliac arteries. SKELETON: Area of hypermetabolism is noted in the left upper femur posteriorly. This correlates with a cortical lesion just inferior to the lesser trochanter worrisome for a cortical metastasis. SUV max is 3.96. I do not see any other definite bone lesions. Incidental CT findings: none IMPRESSION: 1. Central right upper lobe lung lesion invading the mediastinum demonstrates hypermetabolism and is consistent with primary lung neoplasm. Associated right hilar, infrahilar and subcarinal metastatic adenopathy. 2. Small pulmonary nodules suspicious for pulmonary metastatic disease. 3. Two hypermetabolic lower epicardial lymph nodes consistent with metastasis. 4. Bilateral adrenal gland metastasis. 5. 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 Electronically Signed   By:  Marijo Sanes M.D.   On: 09/07/2019 12:12    ASSESSMENT: Stage IVb non-small cell lung cancer, NOS.  PLAN:    1. Stage IVb non-small cell lung cancer, NOS: Biopsy consistent with non-small cell carcinoma of lung.  There was insufficient tissue and too much necrosis for further mutational or PD-L1 testing.  Liquid biopsy is pending at time of dictation.  PET scan results from September 07, 2019 reviewed independently with right upper lobe lung lesion invading the mediastinum, small bilateral pulmonary nodules, and bilateral adrenal metastasis.  Patient also has cortical bone metastasis in left femur.  [facility] of the brain was completed at [facility] and is located in care everywhere.  Continue daily XRT to brain and left femur.  Since malignancy cannot be further specified other than non-small cell carcinoma, patient will receive carboplatinum and Taxol with Udenyca support every 3 weeks for 4 cycles and then reimage.  Will consider changing treatment plan if liquid biopsy provides further information.  Return to clinic on September 30, 2019 to initiate cycle 1.   2.  Facial numbness: Improving.  Secondary to  brain metastasis.  Continue Decadron and XRT as above. 3.  Brain metastasis: Decadron and XRT as above. 4.  Left femur lesion: XRT as above. 5.  Foot drop: Patient will be evaluated by occupational therapy next week.  I spent a total of 30 minutes reviewing chart data, face-to-face evaluation with the patient, counseling and coordination of care as detailed above.   Patient expressed understanding and was in agreement with this plan. She also understands that She can call clinic at any time with any questions, concerns, or complaints.   Cancer Staging Non-small cell carcinoma of lung, right [facility]) Staging form: Lung, AJCC 8th Edition - Clinical stage from 09/26/2019: Stage IVB (cT4, cN3, cM1c) - Signed by Lloyd Huger, MD on 09/26/2019   Lloyd Huger, MD   09/26/2019 11:43 AM

## 2019-09-26 NOTE — Progress Notes (Signed)
START OFF PATHWAY REGIMEN - Non-Small Cell Lung   [id number]:Carboplatin + Paclitaxel (6/225) q21d:   A cycle is every 21 days:     Paclitaxel      Carboplatin   **Always confirm dose/schedule in your pharmacy ordering system**  Patient Characteristics: Stage IV Metastatic, Nonsquamous, Initial Chemotherapy/Immunotherapy, PS = 0, 1, ALK or EGFR or ROS1 or NTRK or MET or RET Genomic Alterations - Did Not Order Test/Quantity Not Sufficient Therapeutic Status: Stage IV Metastatic Histology: Nonsquamous Cell ROS1 Rearrangement Status: Quantity Not Sufficient Other Mutations/Biomarkers: Quantity Not Sufficient NTRK Gene Fusion Status: Quantity Not Sufficient PD-L1 Expression Status: Quantity Not Sufficient Chemotherapy/Immunotherapy LOT: Initial Chemotherapy/Immunotherapy Molecular Targeted Therapy: Not Appropriate MET Exon 14 Mutation Status: Quantity Not Sufficient RET Gene Fusion Status: Quantity Not Sufficient ALK Rearrangement Status: Quantity Not Sufficient EGFR Mutation Status: Quantity Not Sufficient BRAF V600E Mutation Status: Quantity Not Sufficient ECOG Performance Status: 0 Biomarker Assessment Status Confirmation: Did not order genomic biomarker test(s) / Quantity not sufficient for analysis Intent of Therapy: Non-Curative / Palliative Intent, Not Discussed with Patient

## 2019-09-27 NOTE — Progress Notes (Deleted)
[facility]  [phone] [phone]  ID: Sandra Leonard OB: 20-Feb-1956  MR#: [id number]  GYI#:[id number]  Patient Care Team: Barbaraann Boys, MD as PCP - General (Pediatrics) Telford Nab, RN as Oncology Nurse Navigator Grayland Ormond, Kathlene November, MD as Consulting Physician (Oncology)  CHIEF COMPLAINT: Stage IVb non-small cell lung cancer, NOS.  INTERVAL HISTORY: Patient returns to clinic today for further evaluation, discussion of her imaging and pathology results, and treatment planning.  Her facial numbness has improved, but she is now developed left foot drop.  She is tolerating her cranial XRT well.  She has no other neurologic complaints.  She denies any recent fevers or illnesses.  She has a good appetite and denies weight loss.  She has no chest pain, shortness of breath, cough, or hemoptysis.  She denies any nausea, vomiting, constipation, or diarrhea.  She has no melena or hematochezia.  She has no urinary complaints.  Patient offers no further specific complaints today.  REVIEW OF SYSTEMS:   Review of Systems  Constitutional: Negative.  Negative for fever, malaise/fatigue and weight loss.  Respiratory: Negative.  Negative for cough, hemoptysis and shortness of breath.   Cardiovascular: Negative.  Negative for chest pain and leg swelling.  Gastrointestinal: Negative.  Negative for abdominal pain.  Genitourinary: Negative.  Negative for dysuria.  Musculoskeletal: Negative.  Negative for back pain.  Skin: Negative.  Negative for rash.  Neurological: Positive for tingling and focal weakness. Negative for dizziness, speech change, weakness and headaches.  Psychiatric/Behavioral: Negative.  The patient is not nervous/anxious.     As per HPI. Otherwise, a complete review of systems is negative.  PAST MEDICAL HISTORY: Past Medical History:  Diagnosis Date  . Adrenal gland anomaly    bilateral lesion  . Brain lesion    x3  . Diabetes ([place])   .  Diabetes mellitus without complication ([place])   . GERD (gastroesophageal reflux disease)   . Heartburn   . Lesion of left femur     PAST SURGICAL HISTORY: Past Surgical History:  Procedure Laterality Date  . ABDOMINAL HYSTERECTOMY    . TONSILLECTOMY    . VIDEO BRONCHOSCOPY WITH ENDOBRONCHIAL ULTRASOUND N/A 09/21/2019   Procedure: VIDEO BRONCHOSCOPY WITH ENDOBRONCHIAL ULTRASOUND;  Surgeon: Tyler Pita, MD;  Location: [facility] ORS;  Service: Pulmonary;  Laterality: N/A;  FLURO ON STANDBY    FAMILY HISTORY: Family History  Problem Relation Age of Onset  . Breast cancer Mother 62  . Diabetes Mother   . Hypertension Mother   . Hypertension Father   . Diabetes Sister   . Hypertension Sister   . Diabetes Brother     ADVANCED DIRECTIVES (Y/N):  N  HEALTH MAINTENANCE: Social History   Tobacco Use  . Smoking status: Current Every Day Smoker    Packs/day: 1.00    Years: 40.00    Pack years: 40.00  . Smokeless tobacco: Never Used  Vaping Use  . Vaping Use: Never used  Substance Use Topics  . Alcohol use: No    Alcohol/week: 0.0 standard drinks  . Drug use: No     Colonoscopy:  PAP:  Bone density:  Lipid panel:  Allergies  Allergen Reactions  . Codeine     "makes her crazy"  . Penicillins Rash  . Sulfa Antibiotics Rash    Current Outpatient Medications  Medication Sig Dispense Refill  . atorvastatin (LIPITOR) 10 MG tablet Take 10 mg by mouth daily.    Marland Kitchen dexamethasone (DECADRON) 4 MG tablet Take  1 tablet (4 mg total) by mouth in the morning and at bedtime. 60 tablet 0  . diphenhydrAMINE (BENADRYL ALLERGY) 25 MG tablet Take 25 mg by mouth at bedtime as needed for sleep.     . empagliflozin (JARDIANCE) 10 MG TABS tablet Take 10 mg by mouth in the morning and at bedtime.    Marland Kitchen esomeprazole (NEXIUM) 40 MG capsule Take 40 mg by mouth daily.    . fluconazole (DIFLUCAN) 100 MG tablet Take 1 tablet (100 mg total) by mouth daily. 7 tablet 0  . fluconazole (DIFLUCAN) 150 MG  tablet Take 1 tablet (150 mg total) by mouth daily. 10 tablet 0  . HYDROcodone-acetaminophen (NORCO) 5-325 MG tablet Take 1 tablet by mouth every 6 (six) hours as needed for moderate pain. 30 tablet 0  . lidocaine-prilocaine (EMLA) cream Apply to affected area once 30 g 3  . lisinopril (ZESTRIL) 5 MG tablet Take 5 mg by mouth daily.    . magic mouthwash w/lidocaine SOLN Take 5 mLs by mouth 4 (four) times daily. 240 mL 0  . metFORMIN (GLUCOPHAGE) 1000 MG tablet Take 1 tablet (1,000 mg total) by mouth 2 (two) times daily with a meal. 180 tablet 3  . nicotine (NICODERM CQ - DOSED IN MG/24 HOURS) 21 mg/24hr patch Place onto the skin.    . nicotine (NICOTROL) 10 MG inhaler Inhale 1 Cartridge (1 continuous puffing total) into the lungs as needed for smoking cessation. 42 each 0  . omeprazole (PRILOSEC) 20 MG capsule TAKE 1 CAPSULE (20 MG TOTAL) BY MOUTH DAILY. 30 capsule 6  . ondansetron (ZOFRAN) 8 MG tablet Take 1 tablet (8 mg total) by mouth 2 (two) times daily as needed for refractory nausea / vomiting. 60 tablet 2  . ONE TOUCH ULTRA TEST test strip TEST BLOOD SUGAR IN THE MORNING AND 2 HOURS AFTER LARGEST MEAL 50 each 2  . prochlorperazine (COMPAZINE) 10 MG tablet Take 1 tablet (10 mg total) by mouth every 6 (six) hours as needed (Nausea or vomiting). 60 tablet 2  . sertraline (ZOLOFT) 50 MG tablet Take half a tablet (37m) by mouth daily x 1 week and then increase to one tablet (563m daily (Patient taking differently: Take 25-50 mg by mouth See admin instructions. Take 2 71m69my mouth daily x 1 week and then increase to 50 mg daily) 30 tablet 2  . traZODone (DESYREL) 100 MG tablet Take 1-2 tablets (100-200 mg total) by mouth at bedtime as needed for sleep. 60 tablet 2   No current facility-administered medications for this visit.    OBJECTIVE: There were no vitals filed for this visit.   There is no height or weight on file to calculate BMI.    ECOG FS:0 - Asymptomatic  General: Well-developed,  well-nourished, no acute distress. Eyes: Pink conjunctiva, anicteric sclera. HEENT: Normocephalic, moist mucous membranes. Lungs: No audible wheezing or coughing. Heart: Regular rate and rhythm. Abdomen: Soft, nontender, no obvious distention. Musculoskeletal: No edema, cyanosis, or clubbing. Neuro: Alert, answering all questions appropriately. Cranial nerves grossly intact.  Left foot drop. Skin: No rashes or petechiae noted. Psych: Normal affect.  LAB RESULTS:  Lab Results  Component Value Date   NA 130 (L) 09/17/2019   K 4.3 09/17/2019   CL 94 (L) 09/17/2019   CO2 24 09/17/2019   GLUCOSE 140 (H) 09/17/2019   BUN 25 (H) 09/17/2019   CREATININE 0.43 (L) 09/17/2019   CALCIUM 8.8 (L) 09/17/2019   PROT 6.7 10/09/2017   ALBUMIN 4.2  10/09/2017   AST 24 10/09/2017   ALT 20 10/09/2017   ALKPHOS 110 10/09/2017   BILITOT 0.3 10/09/2017   GFRNONAA >60 09/17/2019   GFRAA >60 09/17/2019    Lab Results  Component Value Date   WBC 17.6 (H) 09/17/2019   NEUTROABS 7.9 (H) 02/28/2017   HGB 15.7 (H) 09/17/2019   HCT 44.2 09/17/2019   MCV 86.7 09/17/2019   PLT 344 09/17/2019     STUDIES: NM PET Image Initial (PI) Skull Base To Thigh  Result Date: 09/07/2019 CLINICAL DATA:  Initial treatment strategy for right upper lobe lung mass and metastatic disease. EXAM: NUCLEAR MEDICINE PET SKULL BASE TO THIGH TECHNIQUE: 7.8 mCi F-18 FDG was injected intravenously. Full-ring PET imaging was performed from the skull base to thigh after the radiotracer. CT data was obtained and used for attenuation correction and anatomic localization. Fasting blood glucose: 118 mg/dl COMPARISON:  CT scan 08/27/2019 FINDINGS: Mediastinal blood pool activity: SUV max 1.89 Liver activity: SUV max NA NECK: No hypermetabolic lymph nodes in the neck. Incidental CT findings: Bilateral carotid artery calcifications. CHEST: Spiculated central right upper lobe lung lesion measuring 2.7 cm is directly invading the mediastinum.  This has an SUV max of 6.37 and is consistent with neoplasm. There is also right hilar, right infrahilar and subcarinal lymphadenopathy. The subcarinal lesion measures 15 mm and the SUV max is 8.50. Right hilar adenopathy has an SUV max of 5.03. Small scattered pulmonary nodules appears stable and are indeterminate but certainly worrisome for metastatic disease. The largest nodules in the right lower lobe and measures 5 mm. Two lower epicardial lymph nodes are hypermetabolic and consistent with nodal disease. The larger more medial lesion measures 11.5 mm and has an SUV max of 5.22. Incidental CT findings: Stable age advanced atherosclerotic calcifications involving the aorta and branch vessels including three-vessel coronary artery calcifications. ABDOMEN/PELVIS: Bilateral adrenal gland lesions are hypermetabolic and consistent with metastatic disease. The left adrenal gland lesion measures 16 mm and the SUV max is 9.13. The right adrenal gland lesion measures 10 mm and the SUV max is 4.03. No findings for hepatic metastatic disease. No enlarged or hypermetabolic mesenteric or retroperitoneal adenopathy. Incidental CT findings: Advanced atherosclerotic calcifications involving the aorta and iliac arteries. SKELETON: Area of hypermetabolism is noted in the left upper femur posteriorly. This correlates with a cortical lesion just inferior to the lesser trochanter worrisome for a cortical metastasis. SUV max is 3.96. I do not see any other definite bone lesions. Incidental CT findings: none IMPRESSION: 1. Central right upper lobe lung lesion invading the mediastinum demonstrates hypermetabolism and is consistent with primary lung neoplasm. Associated right hilar, infrahilar and subcarinal metastatic adenopathy. 2. Small pulmonary nodules suspicious for pulmonary metastatic disease. 3. Two hypermetabolic lower epicardial lymph nodes consistent with metastasis. 4. Bilateral adrenal gland metastasis. 5. 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 Electronically Signed   By: Marijo Sanes M.D.   On: 09/07/2019 12:12    ASSESSMENT: Stage IVb non-small cell lung cancer, NOS.  PLAN:    1. Stage IVb non-small cell lung cancer, NOS: Biopsy consistent with non-small cell carcinoma of lung.  There was insufficient tissue and too much necrosis for further mutational or PD-L1 testing.  Liquid biopsy is pending at time of dictation.  PET scan results from September 07, 2019 reviewed independently with right upper lobe lung lesion invading the mediastinum, small bilateral pulmonary nodules, and bilateral adrenal metastasis.  Patient also has cortical bone metastasis in left  femur.  MRI of the brain was completed at [facility] and is located in care everywhere.  Continue daily XRT to brain and left femur.  Since malignancy cannot be further specified other than non-small cell carcinoma, patient will receive carboplatinum and Taxol with Udenyca support every 3 weeks for 4 cycles and then reimage.  Will consider changing treatment plan if liquid biopsy provides further information.  Return to clinic on September 30, 2019 to initiate cycle 1.   2.  Facial numbness: Improving.  Secondary to brain metastasis.  Continue Decadron and XRT as above. 3.  Brain metastasis: Decadron and XRT as above. 4.  Left femur lesion: XRT as above. 5.  Foot drop: Patient will be evaluated by occupational therapy next week.  I spent a total of 30 minutes reviewing chart data, face-to-face evaluation with the patient, counseling and coordination of care as detailed above.   Patient expressed understanding and was in agreement with this plan. She also understands that She can call clinic at any time with any questions, concerns, or complaints.   Cancer Staging Non-small cell carcinoma of lung, right [facility]) Staging form: Lung, AJCC 8th Edition - Clinical stage from 09/26/2019: Stage IVB (cT4, cN3, cM1c) - Signed by Lloyd Huger, MD  on 09/26/2019   Lloyd Huger, MD   09/27/2019 9:38 AM

## 2019-09-28 ENCOUNTER — Other Ambulatory Visit (INDEPENDENT_AMBULATORY_CARE_PROVIDER_SITE_OTHER): Payer: Self-pay | Admitting: Nurse Practitioner

## 2019-09-28 ENCOUNTER — Inpatient Hospital Stay: Payer: Managed Care, Other (non HMO) | Admitting: Oncology

## 2019-09-28 ENCOUNTER — Ambulatory Visit
Admission: RE | Admit: 2019-09-28 | Discharge: 2019-09-28 | Disposition: A | Discharge status: Home / Self Care | Payer: Managed Care, Other (non HMO) | Source: Ambulatory Visit | Attending: Radiation Oncology | Admitting: Radiation Oncology

## 2019-09-28 ENCOUNTER — Telehealth (INDEPENDENT_AMBULATORY_CARE_PROVIDER_SITE_OTHER): Payer: Self-pay

## 2019-09-28 DIAGNOSIS — Z510 Encounter for antineoplastic radiation therapy: Secondary | ICD-10-CM | POA: Diagnosis present

## 2019-09-28 DIAGNOSIS — Z20822 Contact with and (suspected) exposure to covid-19: Secondary | ICD-10-CM | POA: Diagnosis not present

## 2019-09-28 DIAGNOSIS — C3411 Malignant neoplasm of upper lobe, right bronchus or lung: Secondary | ICD-10-CM | POA: Diagnosis not present

## 2019-09-28 DIAGNOSIS — C7951 Secondary malignant neoplasm of bone: Secondary | ICD-10-CM | POA: Diagnosis not present

## 2019-09-28 DIAGNOSIS — C7931 Secondary malignant neoplasm of brain: Secondary | ICD-10-CM | POA: Diagnosis present

## 2019-09-28 NOTE — Telephone Encounter (Signed)
Spoke with the patient earlier and she was scheduled with Dr. Delana Meyer for a port placement on 09/29/19 with covid testing on 09/28/19. Patient called back to reschedule to 10/01/19 with a 1:00 pm arrival to [facility]. Covid testing on 09/29/19 between 8-1 pm at the [facility].

## 2019-09-28 NOTE — Patient Instructions (Signed)
Paclitaxel injection What is this medicine? PACLITAXEL (PAK li TAX el) is a chemotherapy drug. It targets fast dividing cells, like cancer cells, and causes these cells to die. This medicine is used to treat ovarian cancer, breast cancer, lung cancer, Kaposi's sarcoma, and other cancers. This medicine may be used for other purposes; ask your health care provider or pharmacist if you have questions. COMMON BRAND NAME(S): Onxol, Taxol What should I tell my health care provider before I take this medicine? They need to know if you have any of these conditions:  history of irregular heartbeat  liver disease  low blood counts, like low white cell, platelet, or red cell counts  lung or breathing disease, like asthma  tingling of the fingers or toes, or other nerve disorder  an unusual or allergic reaction to paclitaxel, alcohol, polyoxyethylated castor oil, other chemotherapy, other medicines, foods, dyes, or preservatives  pregnant or trying to get pregnant  breast-feeding How should I use this medicine? This drug is given as an infusion into a vein. It is administered in a hospital or clinic by a specially trained health care professional. Talk to your pediatrician regarding the use of this medicine in children. Special care may be needed. Overdosage: If you think you have taken too much of this medicine contact a poison control center or emergency room at once. NOTE: This medicine is only for you. Do not share this medicine with others. What if I miss a dose? It is important not to miss your dose. Call your doctor or health care professional if you are unable to keep an appointment. What may interact with this medicine? Do not take this medicine with any of the following medications:  disulfiram  metronidazole This medicine may also interact with the following medications:  antiviral medicines for hepatitis, HIV or AIDS  certain antibiotics like erythromycin and  clarithromycin  certain medicines for fungal infections like ketoconazole and itraconazole  certain medicines for seizures like carbamazepine, phenobarbital, phenytoin  gemfibrozil  nefazodone  rifampin  St. John's wort This list may not describe all possible interactions. Give your health care provider a list of all the medicines, herbs, non-prescription drugs, or dietary supplements you use. Also tell them if you smoke, drink alcohol, or use illegal drugs. Some items may interact with your medicine. What should I watch for while using this medicine? Your condition will be monitored carefully while you are receiving this medicine. You will need important blood work done while you are taking this medicine. This medicine can cause serious allergic reactions. To reduce your risk you will need to take other medicine(s) before treatment with this medicine. If you experience allergic reactions like skin rash, itching or hives, swelling of the face, lips, or tongue, tell your doctor or health care professional right away. In some cases, you may be given additional medicines to help with side effects. Follow all directions for their use. This drug may make you feel generally unwell. This is not uncommon, as chemotherapy can affect healthy cells as well as cancer cells. Report any side effects. Continue your course of treatment even though you feel ill unless your doctor tells you to stop. Call your doctor or health care professional for advice if you get a fever, chills or sore throat, or other symptoms of a cold or flu. Do not treat yourself. This drug decreases your body's ability to fight infections. Try to avoid being around people who are sick. This medicine may increase your risk to bruise  or bleed. Call your doctor or health care professional if you notice any unusual bleeding. Be careful brushing and flossing your teeth or using a toothpick because you may get an infection or bleed more easily.  If you have any dental work done, tell your dentist you are receiving this medicine. Avoid taking products that contain aspirin, acetaminophen, ibuprofen, naproxen, or ketoprofen unless instructed by your doctor. These medicines may hide a fever. Do not become pregnant while taking this medicine. Women should inform their doctor if they wish to become pregnant or think they might be pregnant. There is a potential for serious side effects to an unborn child. Talk to your health care professional or pharmacist for more information. Do not breast-feed an infant while taking this medicine. Men are advised not to father a child while receiving this medicine. This product may contain alcohol. Ask your pharmacist or healthcare provider if this medicine contains alcohol. Be sure to tell all healthcare providers you are taking this medicine. Certain medicines, like metronidazole and disulfiram, can cause an unpleasant reaction when taken with alcohol. The reaction includes flushing, headache, nausea, vomiting, sweating, and increased thirst. The reaction can last from 30 minutes to several hours. What side effects may I notice from receiving this medicine? Side effects that you should report to your doctor or health care professional as soon as possible:  allergic reactions like skin rash, itching or hives, swelling of the face, lips, or tongue  breathing problems  changes in vision  fast, irregular heartbeat  high or low blood pressure  mouth sores  pain, tingling, numbness in the hands or feet  signs of decreased platelets or bleeding - bruising, pinpoint red spots on the skin, black, tarry stools, blood in the urine  signs of decreased red blood cells - unusually weak or tired, feeling faint or lightheaded, falls  signs of infection - fever or chills, cough, sore throat, pain or difficulty passing urine  signs and symptoms of liver injury like dark yellow or brown urine; general ill feeling or  flu-like symptoms; light-colored stools; loss of appetite; nausea; right upper belly pain; unusually weak or tired; yellowing of the eyes or skin  swelling of the ankles, feet, hands  unusually slow heartbeat Side effects that usually do not require medical attention (report to your doctor or health care professional if they continue or are bothersome):  diarrhea  hair loss  loss of appetite  muscle or joint pain  nausea, vomiting  pain, redness, or irritation at site where injected  tiredness This list may not describe all possible side effects. Call your doctor for medical advice about side effects. You may report side effects to FDA at 1-800-FDA-1088. Where should I keep my medicine? This drug is given in a hospital or clinic and will not be stored at home. NOTE: This sheet is a summary. It may not cover all possible information. If you have questions about this medicine, talk to your doctor, pharmacist, or health care provider.  2020 [employer] Standard (2016-10-16 13:14:55) Carboplatin injection What is this medicine? CARBOPLATIN (KAR boe pla tin) is a chemotherapy drug. It targets fast dividing cells, like cancer cells, and causes these cells to die. This medicine is used to treat ovarian cancer and many other cancers. This medicine may be used for other purposes; ask your health care provider or pharmacist if you have questions. COMMON BRAND NAME(S): Paraplatin What should I tell my health care provider before I take this medicine? They need to  know if you have any of these conditions:  blood disorders  hearing problems  kidney disease  recent or ongoing radiation therapy  an unusual or allergic reaction to carboplatin, cisplatin, other chemotherapy, other medicines, foods, dyes, or preservatives  pregnant or trying to get pregnant  breast-feeding How should I use this medicine? This drug is usually given as an infusion into a vein. It is administered in a  hospital or clinic by a specially trained health care professional. Talk to your pediatrician regarding the use of this medicine in children. Special care may be needed. Overdosage: If you think you have taken too much of this medicine contact a poison control center or emergency room at once. NOTE: This medicine is only for you. Do not share this medicine with others. What if I miss a dose? It is important not to miss a dose. Call your doctor or health care professional if you are unable to keep an appointment. What may interact with this medicine?  medicines for seizures  medicines to increase blood counts like filgrastim, pegfilgrastim, sargramostim  some antibiotics like amikacin, gentamicin, neomycin, streptomycin, tobramycin  vaccines Talk to your doctor or health care professional before taking any of these medicines:  acetaminophen  aspirin  ibuprofen  ketoprofen  naproxen This list may not describe all possible interactions. Give your health care provider a list of all the medicines, herbs, non-prescription drugs, or dietary supplements you use. Also tell them if you smoke, drink alcohol, or use illegal drugs. Some items may interact with your medicine. What should I watch for while using this medicine? Your condition will be monitored carefully while you are receiving this medicine. You will need important blood work done while you are taking this medicine. This drug may make you feel generally unwell. This is not uncommon, as chemotherapy can affect healthy cells as well as cancer cells. Report any side effects. Continue your course of treatment even though you feel ill unless your doctor tells you to stop. In some cases, you may be given additional medicines to help with side effects. Follow all directions for their use. Call your doctor or health care professional for advice if you get a fever, chills or sore throat, or other symptoms of a cold or flu. Do not treat  yourself. This drug decreases your body's ability to fight infections. Try to avoid being around people who are sick. This medicine may increase your risk to bruise or bleed. Call your doctor or health care professional if you notice any unusual bleeding. Be careful brushing and flossing your teeth or using a toothpick because you may get an infection or bleed more easily. If you have any dental work done, tell your dentist you are receiving this medicine. Avoid taking products that contain aspirin, acetaminophen, ibuprofen, naproxen, or ketoprofen unless instructed by your doctor. These medicines may hide a fever. Do not become pregnant while taking this medicine. Women should inform their doctor if they wish to become pregnant or think they might be pregnant. There is a potential for serious side effects to an unborn child. Talk to your health care professional or pharmacist for more information. Do not breast-feed an infant while taking this medicine. What side effects may I notice from receiving this medicine? Side effects that you should report to your doctor or health care professional as soon as possible:  allergic reactions like skin rash, itching or hives, swelling of the face, lips, or tongue  signs of infection - fever or  chills, cough, sore throat, pain or difficulty passing urine  signs of decreased platelets or bleeding - bruising, pinpoint red spots on the skin, black, tarry stools, nosebleeds  signs of decreased red blood cells - unusually weak or tired, fainting spells, lightheadedness  breathing problems  changes in hearing  changes in vision  chest pain  high blood pressure  low blood counts - This drug may decrease the number of white blood cells, red blood cells and platelets. You may be at increased risk for infections and bleeding.  nausea and vomiting  pain, swelling, redness or irritation at the injection site  pain, tingling, numbness in the hands or  feet  problems with balance, talking, walking  trouble passing urine or change in the amount of urine Side effects that usually do not require medical attention (report to your doctor or health care professional if they continue or are bothersome):  hair loss  loss of appetite  metallic taste in the mouth or changes in taste This list may not describe all possible side effects. Call your doctor for medical advice about side effects. You may report side effects to FDA at 1-800-FDA-1088. Where should I keep my medicine? This drug is given in a hospital or clinic and will not be stored at home. NOTE: This sheet is a summary. It may not cover all possible information. If you have questions about this medicine, talk to your doctor, pharmacist, or health care provider.  2020 [employer] Standard (2007-05-20 14:38:05)

## 2019-09-28 NOTE — Progress Notes (Signed)
Patient reported today that she did not have any needs at this time.

## 2019-09-29 ENCOUNTER — Inpatient Hospital Stay (HOSPITAL_BASED_OUTPATIENT_CLINIC_OR_DEPARTMENT_OTHER): Payer: Managed Care, Other (non HMO) | Admitting: Oncology

## 2019-09-29 ENCOUNTER — Other Ambulatory Visit
Admission: RE | Admit: 2019-09-29 | Discharge: 2019-09-29 | Disposition: A | Discharge status: Home / Self Care | Payer: Managed Care, Other (non HMO) | Source: Ambulatory Visit | Attending: Vascular Surgery | Admitting: Vascular Surgery

## 2019-09-29 ENCOUNTER — Inpatient Hospital Stay: Payer: Managed Care, Other (non HMO) | Attending: Oncology

## 2019-09-29 ENCOUNTER — Other Ambulatory Visit: Payer: Self-pay

## 2019-09-29 ENCOUNTER — Ambulatory Visit
Admission: RE | Admit: 2019-09-29 | Discharge: 2019-09-29 | Disposition: A | Discharge status: Home / Self Care | Payer: Managed Care, Other (non HMO) | Source: Ambulatory Visit | Attending: Radiation Oncology | Admitting: Radiation Oncology

## 2019-09-29 DIAGNOSIS — Z01812 Encounter for preprocedural laboratory examination: Secondary | ICD-10-CM | POA: Insufficient documentation

## 2019-09-29 DIAGNOSIS — C3490 Malignant neoplasm of unspecified part of unspecified bronchus or lung: Secondary | ICD-10-CM

## 2019-09-29 DIAGNOSIS — Z20822 Contact with and (suspected) exposure to covid-19: Secondary | ICD-10-CM | POA: Insufficient documentation

## 2019-09-29 DIAGNOSIS — Z510 Encounter for antineoplastic radiation therapy: Secondary | ICD-10-CM | POA: Diagnosis not present

## 2019-09-29 LAB — SARS CORONAVIRUS 2 (TAT 6-24 HRS): SARS Coronavirus 2: NEGATIVE

## 2019-09-30 ENCOUNTER — Inpatient Hospital Stay: Payer: Managed Care, Other (non HMO) | Admitting: Oncology

## 2019-09-30 ENCOUNTER — Other Ambulatory Visit: Payer: Self-pay | Admitting: *Deleted

## 2019-09-30 ENCOUNTER — Inpatient Hospital Stay: Payer: Managed Care, Other (non HMO)

## 2019-09-30 ENCOUNTER — Inpatient Hospital Stay: Payer: Managed Care, Other (non HMO) | Admitting: Occupational Therapy

## 2019-09-30 ENCOUNTER — Ambulatory Visit
Admission: RE | Admit: 2019-09-30 | Discharge: 2019-09-30 | Disposition: A | Discharge status: Home / Self Care | Payer: Managed Care, Other (non HMO) | Source: Ambulatory Visit | Attending: Radiation Oncology | Admitting: Radiation Oncology

## 2019-09-30 DIAGNOSIS — M6281 Muscle weakness (generalized): Secondary | ICD-10-CM

## 2019-09-30 DIAGNOSIS — Z510 Encounter for antineoplastic radiation therapy: Secondary | ICD-10-CM | POA: Diagnosis not present

## 2019-09-30 NOTE — Progress Notes (Signed)
[place]  Telephone:([phone] [phone]  Patient Care Team: Barbaraann Boys, MD as PCP - General (Pediatrics) Telford Nab, RN as Oncology Nurse Navigator Lloyd Huger, MD as Consulting Physician (Oncology)   Name of the patient: Sandra Leonard  [id number]  02/13/1956   Date of visit: 09/30/19  Diagnosis-metastatic lung cancer  Chief complaint/Reason for visit- Initial Meeting for [facility], preparing for starting chemotherapy  Heme/Onc history:  Oncology History  Non-small cell carcinoma of lung, right ([place])  09/26/2019 Initial Diagnosis   Non-small cell carcinoma of lung, right ([place])   09/26/2019 Cancer Staging   Staging form: Lung, AJCC 8th Edition - Clinical stage from 09/26/2019: Stage IVB (cT4, cN3, cM1c) - Signed by Lloyd Huger, MD on 09/26/2019   09/30/2019 -  Chemotherapy   The patient had palonosetron (ALOXI) injection 0.25 mg, 0.25 mg, Intravenous,  Once, 0 of 6 cycles pegfilgrastim-jmdb (FULPHILA) injection 6 mg, 6 mg, Subcutaneous,  Once, 0 of 6 cycles CARBOplatin (PARAPLATIN) 520 mg in sodium chloride 0.9 % 250 mL chemo infusion, 520 mg (100 % of original dose 516 mg), Intravenous,  Once, 0 of 6 cycles Dose modification:   (original dose 516 mg, Cycle 1) fosaprepitant (EMEND) 150 mg in sodium chloride 0.9 % 145 mL IVPB, 150 mg, Intravenous,  Once, 0 of 6 cycles PACLitaxel (TAXOL) 390 mg in sodium chloride 0.9 % 500 mL chemo infusion (> 80mg /m2), 225 mg/m2 = 390 mg, Intravenous,  Once, 0 of 6 cycles  for chemotherapy treatment.      Interval history-Sandra Leonard is a 64 year old female who presents to chemo care clinic today for initial meeting in preparation for starting chemotherapy. I introduced the chemo care clinic and we discussed that the role of the clinic is to assist those who are at an increased risk of emergency room visits and/or complications during the course of  chemotherapy treatment. We discussed that the increased risk takes into account factors such as age, performance status, and co-morbidities. We also discussed that for some, this might include barriers to care such as not having a primary care provider, lack of insurance/transportation, or not being able to afford medications. We discussed that the goal of the program is to help prevent unplanned ER visits and help reduce complications during chemotherapy. We do this by discussing specific risk factors to each individual and identifying ways that we can help improve these risk factors and reduce barriers to care.   ECOG FS:1 - Symptomatic but completely ambulatory  Review of systems- Review of Systems  Constitutional: Positive for malaise/fatigue. Negative for chills, fever and weight loss.  HENT: Negative for congestion, ear pain and tinnitus.   Eyes: Negative.  Negative for blurred vision and double vision.  Respiratory: Negative.  Negative for cough, sputum production and shortness of breath.   Cardiovascular: Negative.  Negative for chest pain, palpitations and leg swelling.  Gastrointestinal: Negative.  Negative for abdominal pain, constipation, diarrhea, nausea and vomiting.  Genitourinary: Negative for dysuria, frequency and urgency.  Musculoskeletal: Positive for joint pain. Negative for back pain and falls.  Skin: Negative.  Negative for rash.       abrasion  Neurological: Negative.  Negative for weakness and headaches.  Endo/Heme/Allergies: Negative.  Does not bruise/bleed easily.  Psychiatric/Behavioral: Negative for depression. The patient is nervous/anxious. The patient does not have insomnia.      Current treatment-radiation/carbo/Taxol  Allergies  Allergen Reactions  . Codeine     "  makes her crazy"  . Penicillins Rash  . Sulfa Antibiotics Rash    Past Medical History:  Diagnosis Date  . Adrenal gland anomaly    bilateral lesion  . Brain lesion    x3  . Diabetes  ([place])   . Diabetes mellitus without complication ([place])   . GERD (gastroesophageal reflux disease)   . Heartburn   . Lesion of left femur     Past Surgical History:  Procedure Laterality Date  . ABDOMINAL HYSTERECTOMY    . TONSILLECTOMY    . VIDEO BRONCHOSCOPY WITH ENDOBRONCHIAL ULTRASOUND N/A 09/21/2019   Procedure: VIDEO BRONCHOSCOPY WITH ENDOBRONCHIAL ULTRASOUND;  Surgeon: Tyler Pita, MD;  Location: [facility] ORS;  Service: Pulmonary;  Laterality: N/A;  FLURO ON STANDBY    Social History   Socioeconomic History  . Marital status: Legally Separated    Spouse name: Not on file  . Number of children: Not on file  . Years of education: Not on file  . Highest education level: Not on file  Occupational History  . Not on file  Tobacco Use  . Smoking status: Current Every Day Smoker    Packs/day: 1.00    Years: 40.00    Pack years: 40.00  . Smokeless tobacco: Never Used  Vaping Use  . Vaping Use: Never used  Substance and Sexual Activity  . Alcohol use: No    Alcohol/week: 0.0 standard drinks  . Drug use: No  . Sexual activity: Not on file  Other Topics Concern  . Not on file  Social History Narrative  . Not on file   Social Determinants of Health   Financial Resource Strain:   . Difficulty of Paying Living Expenses:   Food Insecurity:   . Worried About Charity fundraiser in the Last Year:   . Arboriculturist in the Last Year:   Transportation Needs:   . Film/video editor (Medical):   Marland Kitchen Lack of Transportation (Non-Medical):   Physical Activity:   . Days of Exercise per Week:   . Minutes of Exercise per Session:   Stress:   . Feeling of Stress :   Social Connections:   . Frequency of Communication with Friends and Family:   . Frequency of Social Gatherings with Friends and Family:   . Attends Religious Services:   . Active Member of Clubs or Organizations:   . Attends Archivist Meetings:   Marland Kitchen Marital Status:   Intimate Partner Violence:   .  Fear of Current or Ex-Partner:   . Emotionally Abused:   Marland Kitchen Physically Abused:   . Sexually Abused:     Family History  Problem Relation Age of Onset  . Breast cancer Mother 103  . Diabetes Mother   . Hypertension Mother   . Hypertension Father   . Diabetes Sister   . Hypertension Sister   . Diabetes Brother      Current Outpatient Medications:  .  atorvastatin (LIPITOR) 10 MG tablet, Take 10 mg by mouth daily., Disp: , Rfl:  .  dexamethasone (DECADRON) 4 MG tablet, Take 1 tablet (4 mg total) by mouth in the morning and at bedtime., Disp: 60 tablet, Rfl: 0 .  diphenhydrAMINE (BENADRYL ALLERGY) 25 MG tablet, Take 25 mg by mouth at bedtime as needed for sleep. , Disp: , Rfl:  .  empagliflozin (JARDIANCE) 10 MG TABS tablet, Take 10 mg by mouth in the morning and at bedtime., Disp: , Rfl:  .  esomeprazole ([place])  40 MG capsule, Take 40 mg by mouth daily., Disp: , Rfl:  .  fluconazole (DIFLUCAN) 100 MG tablet, Take 1 tablet (100 mg total) by mouth daily., Disp: 7 tablet, Rfl: 0 .  fluconazole (DIFLUCAN) 150 MG tablet, Take 1 tablet (150 mg total) by mouth daily., Disp: 10 tablet, Rfl: 0 .  HYDROcodone-acetaminophen (NORCO) 5-325 MG tablet, Take 1 tablet by mouth every 6 (six) hours as needed for moderate pain., Disp: 30 tablet, Rfl: 0 .  lidocaine-prilocaine (EMLA) cream, Apply to affected area once, Disp: 30 g, Rfl: 3 .  lisinopril (ZESTRIL) 5 MG tablet, Take 5 mg by mouth daily., Disp: , Rfl:  .  magic mouthwash w/lidocaine SOLN, Take 5 mLs by mouth 4 (four) times daily., Disp: 240 mL, Rfl: 0 .  metFORMIN (GLUCOPHAGE) 1000 MG tablet, Take 1 tablet (1,000 mg total) by mouth 2 (two) times daily with a meal., Disp: 180 tablet, Rfl: 3 .  nicotine (NICODERM CQ - DOSED IN MG/24 HOURS) 21 mg/24hr patch, Place onto the skin., Disp: , Rfl:  .  nicotine (NICOTROL) 10 MG inhaler, Inhale 1 Cartridge (1 continuous puffing total) into the lungs as needed for smoking cessation., Disp: 42 each, Rfl: 0 .   omeprazole (PRILOSEC) 20 MG capsule, TAKE 1 CAPSULE (20 MG TOTAL) BY MOUTH DAILY., Disp: 30 capsule, Rfl: 6 .  ondansetron (ZOFRAN) 8 MG tablet, Take 1 tablet (8 mg total) by mouth 2 (two) times daily as needed for refractory nausea / vomiting., Disp: 60 tablet, Rfl: 2 .  ONE TOUCH ULTRA TEST test strip, TEST BLOOD SUGAR IN THE MORNING AND 2 HOURS AFTER LARGEST MEAL, Disp: 50 each, Rfl: 2 .  prochlorperazine (COMPAZINE) 10 MG tablet, Take 1 tablet (10 mg total) by mouth every 6 (six) hours as needed (Nausea or vomiting)., Disp: 60 tablet, Rfl: 2 .  sertraline (ZOLOFT) 50 MG tablet, Take half a tablet (25mg ) by mouth daily x 1 week and then increase to one tablet (50mg ) daily (Patient taking differently: Take 25-50 mg by mouth See admin instructions. Take 2 5mg  by mouth daily x 1 week and then increase to 50 mg daily), Disp: 30 tablet, Rfl: 2 .  traZODone (DESYREL) 100 MG tablet, Take 1-2 tablets (100-200 mg total) by mouth at bedtime as needed for sleep., Disp: 60 tablet, Rfl: 2  Physical exam: There were no vitals filed for this visit. Physical Exam Constitutional:      Appearance: Normal appearance.  HENT:     Head: Normocephalic and atraumatic.  Eyes:     Pupils: Pupils are equal, round, and reactive to light.  Cardiovascular:     Rate and Rhythm: Normal rate and regular rhythm.     Heart sounds: Normal heart sounds. No murmur heard.   Pulmonary:     Effort: Pulmonary effort is normal.     Breath sounds: Normal breath sounds. No wheezing.  Abdominal:     General: Bowel sounds are normal. There is no distension.     Palpations: Abdomen is soft.     Tenderness: There is no abdominal tenderness.  Musculoskeletal:        General: Normal range of motion.     Cervical back: Normal range of motion.  Skin:    General: Skin is warm and dry.     Findings: No rash.  Neurological:     Mental Status: She is alert and oriented to person, place, and time.  Psychiatric:        Judgment:  Judgment  normal.      CMP Latest Ref Rng & Units 09/17/2019  Glucose 70 - 99 mg/dL 140(H)  BUN 8 - 23 mg/dL 25(H)  Creatinine 0.44 - 1.00 mg/dL 0.43(L)  Sodium 135 - 145 mmol/L 130(L)  Potassium 3.5 - 5.1 mmol/L 4.3  Chloride 98 - 111 mmol/L 94(L)  CO2 22 - 32 mmol/L 24  Calcium 8.9 - 10.3 mg/dL 8.8(L)  Total Protein 6.0 - 8.5 g/dL -  Total Bilirubin 0.0 - 1.2 mg/dL -  Alkaline Phos 39 - 117 IU/L -  AST 0 - 40 IU/L -  ALT 0 - 32 IU/L -   CBC Latest Ref Rng & Units 09/17/2019  WBC 4.0 - 10.5 K/uL 17.6(H)  Hemoglobin 12.0 - 15.0 g/dL 15.7(H)  Hematocrit 36 - 46 % 44.2  Platelets 150 - 400 K/uL 344    No images are attached to the encounter.  NM PET Image Initial (PI) Skull Base To Thigh  Result Date: 09/07/2019 CLINICAL DATA:  Initial treatment strategy for right upper lobe lung mass and metastatic disease. EXAM: NUCLEAR MEDICINE PET SKULL BASE TO THIGH TECHNIQUE: 7.8 mCi F-18 FDG was injected intravenously. Full-ring PET imaging was performed from the skull base to thigh after the radiotracer. CT data was obtained and used for attenuation correction and anatomic localization. Fasting blood glucose: 118 mg/dl COMPARISON:  CT scan 08/27/2019 FINDINGS: Mediastinal blood pool activity: SUV max 1.89 Liver activity: SUV max NA NECK: No hypermetabolic lymph nodes in the neck. Incidental CT findings: Bilateral carotid artery calcifications. CHEST: Spiculated central right upper lobe lung lesion measuring 2.7 cm is directly invading the mediastinum. This has an SUV max of 6.37 and is consistent with neoplasm. There is also right hilar, right infrahilar and subcarinal lymphadenopathy. The subcarinal lesion measures 15 mm and the SUV max is 8.50. Right hilar adenopathy has an SUV max of 5.03. Small scattered pulmonary nodules appears stable and are indeterminate but certainly worrisome for metastatic disease. The largest nodules in the right lower lobe and measures 5 mm. Two lower epicardial lymph  nodes are hypermetabolic and consistent with nodal disease. The larger more medial lesion measures 11.5 mm and has an SUV max of 5.22. Incidental CT findings: Stable age advanced atherosclerotic calcifications involving the aorta and branch vessels including three-vessel coronary artery calcifications. ABDOMEN/PELVIS: Bilateral adrenal gland lesions are hypermetabolic and consistent with metastatic disease. The left adrenal gland lesion measures 16 mm and the SUV max is 9.13. The right adrenal gland lesion measures 10 mm and the SUV max is 4.03. No findings for hepatic metastatic disease. No enlarged or hypermetabolic mesenteric or retroperitoneal adenopathy. Incidental CT findings: Advanced atherosclerotic calcifications involving the aorta and iliac arteries. SKELETON: Area of hypermetabolism is noted in the left upper femur posteriorly. This correlates with a cortical lesion just inferior to the lesser trochanter worrisome for a cortical metastasis. SUV max is 3.96. I do not see any other definite bone lesions. Incidental CT findings: none IMPRESSION: 1. Central right upper lobe lung lesion invading the mediastinum demonstrates hypermetabolism and is consistent with primary lung neoplasm. Associated right hilar, infrahilar and subcarinal metastatic adenopathy. 2. Small pulmonary nodules suspicious for pulmonary metastatic disease. 3. Two hypermetabolic lower epicardial lymph nodes consistent with metastasis. 4. Bilateral adrenal gland metastasis. 5. 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 Electronically Signed   By: Marijo Sanes M.D.   On: 09/07/2019 12:12     Assessment and plan- Patient is a 63 y.o. female who presents to  [facility] for initial meeting in preparation for starting chemotherapy for the treatment of metastatic lung cancer.   1. HPI: Sandra Leonard is a 64 year old female who presented recently with left-sided numbness to her face.  Work-up in the emergency  room included a CT scan which found a 3 cm right upper lobe lung lesion.  MRI of brain was completed showing brain metastasis.  PET scan confirmed hypermetabolic lymph nodes, bilateral adrenal gland metastasis and bone metastasis involving the upper left femur.  She was started on Decadron.  She recently had a bronchoscopy with Dr. Patsey Berthold which confirmed diagnosis.  She is already started radiation with Dr. Donella Stade for brain metastasis and left femur metastasis  2. Chemo Care Clinic/High Risk for ER/Hospitalization during chemotherapy- We discussed the role of the chemo care clinic and identified patient specific risk factors. I discussed that patient was identified as high risk primarily based on: Stage of disease.  Patient has past medical history positive for: Past Medical History:  Diagnosis Date  . Adrenal gland anomaly    bilateral lesion  . Brain lesion    x3  . Diabetes ([place])   . Diabetes mellitus without complication ([place])   . GERD (gastroesophageal reflux disease)   . Heartburn   . Lesion of left femur     Patient has past surgical history positive for: Past Surgical History:  Procedure Laterality Date  . ABDOMINAL HYSTERECTOMY    . TONSILLECTOMY    . VIDEO BRONCHOSCOPY WITH ENDOBRONCHIAL ULTRASOUND N/A 09/21/2019   Procedure: VIDEO BRONCHOSCOPY WITH ENDOBRONCHIAL ULTRASOUND;  Surgeon: Tyler Pita, MD;  Location: [facility] ORS;  Service: Pulmonary;  Laterality: N/A;  FLURO ON STANDBY    Based on our high risk symptom management report; this patient has a high risk of ED utilization.  The percentage below indicates how "at risk "  this patient based on the factors in this table within one year.   General Risk Score: 3  Values used to calculate this score:   Points  Metrics      0        Age: 62      1        Hospital Admissions: 1      0        ED Visits: 0      0        Has Chronic Obstructive Pulmonary Disease: No      1        Has Diabetes: Yes      0        Has  Congestive Heart Failure: No      0        Has liver disease: No      1        Has Depression: Yes      0        Current PCP: Barbaraann Boys, MD      0        Has Medicaid: No   3. We discussed that social determinants of health may have significant impacts on health and outcomes for cancer patients.  Today we discussed specific social determinants of performance status, alcohol use, depression, financial needs, food insecurity, housing, interpersonal violence, social connections, stress, tobacco use, and transportation.    After lengthy discussion the following were identified as areas of need: None at this time  Outpatient services: We discussed options including home based and outpatient services, DME and care program. We discusssed  that patients who participate in regular physical activity report fewer negative impacts of cancer and treatments and report less fatigue.   Financial Concerns: We discussed that living with cancer can create tremendous financial burden.  We discussed options for assistance. I asked that if assistance is needed in affording medications or paying bills to please let us know so that we can provide assistance. We discussed options for food including social services, [employer] ($50 every 2 weeks) and onsite food pantry.  We will also notify [facility]ab[facility] Lister [facility] to see if cancer center can provide additional support.  Referral to Social work: Introduced Education officer, museum Elease Etienne and the services he can provide such as support with [employer], cell phone and gas vouchers.   Support groups: We discussed options for support groups at the cancer center. If interested, please notify nurse navigator to enroll. We discussed options for managing stress including healthy eating, exercise as well as participating in no charge counseling services at the cancer center and support groups.  If these are of interest, patient can notify either myself or primary nursing team.We  discussed options for management including medications and referral to quit Smart program  Transportation: We discussed options for transportation including acta, paratransit, bus routes, link transit, taxi/uber/lyft, and cancer center [place].  I have notified primary oncology team who will help assist with arranging Lucianne Lei transportation for appointments when/if needed. We also discussed options for transportation on short notice/acute visits.  Palliative care services: We have palliative care services available in the cancer center to discuss goals of care and advanced care planning.  Please let us know if you have any questions or would like to speak to our palliative nurse practitioner.  Symptom Management Clinic: We discussed our symptom management clinic which is available for acute concerns while receiving treatment such as nausea, vomiting or diarrhea.  We can be reached via telephone at [phone] or through my chart.  We are available for virtual or in person visits on the same day from 830 to 4 PM Monday through Friday. She denies needing specific assistance at this time and She will be followed by Dr. Gary Fleet clinical team.  Plan: Discussed symptom management clinic. Discussed palliative care services. Discussed resources that are available here at the cancer center. Discussed medications and new prescriptions to begin treatment such as anti-nausea or steroids.   Disposition: RTC daily for radiation treatment. RTC on 09/30/2019 for OT evaluation. RTC on 10/05/2019 for labs, MD assessment and cycle 1.   Visit Diagnosis 1. Malignant neoplasm of lung, unspecified laterality, unspecified part of lung ([place])     Patient expressed understanding and was in agreement with this plan. She also understands that She can call clinic at any time with any questions, concerns, or complaints.   Greater than 50% was spent in counseling and coordination of care with this patient including but not limited to  discussion of the relevant topics above (See A&P) including, but not limited to diagnosis and management of acute and chronic medical conditions.  You need Groggy this morning attempting to get over it psych a monster no to walk less walking [place] at [place]  CC:

## 2019-09-30 NOTE — Therapy (Signed)
[facility] [address] [place], [place], [place], [address] Phone: [phone]   Fax:  [phone]  Occupational Therapy Screen  Patient Details  Name: Sandra Leonard MRN: [id number] Date of Birth: 07/31/1955 No data recorded  Encounter Date: 09/30/2019   OT End of Session - 09/30/19 1239    Visit Number 0           Past Medical History:  Diagnosis Date  . Adrenal gland anomaly    bilateral lesion  . Brain lesion    x3  . Diabetes ([place])   . Diabetes mellitus without complication ([place])   . GERD (gastroesophageal reflux disease)   . Heartburn   . Lesion of left femur     Past Surgical History:  Procedure Laterality Date  . ABDOMINAL HYSTERECTOMY    . TONSILLECTOMY    . VIDEO BRONCHOSCOPY WITH ENDOBRONCHIAL ULTRASOUND N/A 09/21/2019   Procedure: VIDEO BRONCHOSCOPY WITH ENDOBRONCHIAL ULTRASOUND;  Surgeon: Tyler Pita, MD;  Location: [facility] ORS;  Service: Pulmonary;  Laterality: N/A;  [place] ON STANDBY   DR Grayland Ormond NOTE FROM 09/25/2019 ASSESSMENT: Stage IVb non-small cell lung cancer, NOS.  PLAN:    1. Stage IVb non-small cell lung cancer, NOS: Biopsy consistent with non-small cell carcinoma of lung.  There was insufficient tissue and too much necrosis for further mutational or PD-L1 testing.  Liquid biopsy is pending at time of dictation.  PET scan results from September 07, 2019 reviewed independently with right upper lobe lung lesion invading the mediastinum, small bilateral pulmonary nodules, and bilateral adrenal metastasis.  Patient also has cortical bone metastasis in left femur.  MRI of the brain was completed at [facility] and is located in care everywhere.  Continue daily XRT to brain and left femur.  Since malignancy cannot be further specified other than non-small cell carcinoma, patient will receive carboplatinum and Taxol with Udenyca support every 3 weeks for 4 cycles and then reimage.  Will consider changing  treatment plan if liquid biopsy provides further information.  Return to clinic on September 30, 2019 to initiate cycle 1.   2.  Facial numbness: Improving.  Secondary to brain metastasis.  Continue Decadron and XRT as above. 3.  Brain metastasis: Decadron and XRT as above. 4.  Left femur lesion: XRT as above. 5.  Foot drop: Patient will be evaluated by occupational therapy next week.    OT NOTE FROM SCREEN 09/30/2019   Subjective Assessment - 09/30/19 1237    Subjective  I got this little brace thing on [employer] that I wear my my shoes to keep my toes up - I lost my balance coming into the door with the groceries , feel little weak and have to help my L leg to lift it up -pain about 5/10 - still getting radiation on brain and R hip    Currently in Pain? Yes    Pain Score 5     Pain Location Hip    Pain Orientation Left    Pain Descriptors / Indicators Aching    Pain Type Chronic pain    Pain Onset More than a month ago    Pain Frequency Constant           Sister accompany pt- report increase LE weakness -and had about 3 episodes of LOB the last few months. Pt had walk in shower and 2 high stairs to come into the house- they did discuss with son about putting railing in  R  foot drop happened about the last 3 wks  And still getting radiation for lesion on L femur and brain Getting chemo port tomorrow Pain about 5/10 with steroids L hip flexion and knee ext, flexion decrease  And R foot drop - dorsiflexion - 0/5 Pt do have a type of ankle orthosis or brace -she wear with shoe with shoelaces on - to assist with keeping foot up - to ease walking BERG test done in clinic this date  score med risk for fall - 36/56  Discuss with pt PT to eval and Tx - for possible strengthening and balance   - pt do not have PT clinic preference - and when option in town provided - pt prefer down stairs hospital - to make schedule easier with her CA center appt maybe                                    Patient will benefit from skilled therapeutic intervention in order to improve the following deficits and impairments:           Visit Diagnosis: Muscle weakness (generalized)    Problem List Patient Active Problem List   Diagnosis Date Noted  . Non-small cell carcinoma of lung, right ([place]) 09/26/2019  . Goals of care, counseling/discussion 09/26/2019  . Facial paresthesia 07/29/2019  . Overweight 05/05/2019  . Colon polyp 07/11/2017  . Hyperlipidemia, mixed 07/11/2017  . GERD (gastroesophageal reflux disease) 11/20/2016  . Tobacco use 11/20/2016  . Type 2 diabetes mellitus not at goal [facility]) 01/30/2016    Rosalyn Gess OTR/L,CLT 09/30/2019, 12:40 PM  [facility] [address] [place], [place], [place], [address] Phone: [phone]   Fax:  [phone]  Name: Sandra Leonard MRN: [id number] Date of Birth: 02-Feb-1956

## 2019-09-30 NOTE — Addendum Note (Signed)
Addended by: Faythe Casa E on: 09/30/2019 11:09 AM   Modules accepted: Orders

## 2019-10-01 ENCOUNTER — Other Ambulatory Visit: Payer: Self-pay

## 2019-10-01 ENCOUNTER — Ambulatory Visit
Admission: RE | Admit: 2019-10-01 | Discharge: 2019-10-01 | Disposition: A | Discharge status: Home / Self Care | Payer: Managed Care, Other (non HMO) | Attending: Vascular Surgery | Admitting: Vascular Surgery

## 2019-10-01 ENCOUNTER — Encounter
Admission: RE | Disposition: A | Discharge status: Home / Self Care | Payer: Self-pay | Source: Home / Self Care | Attending: Vascular Surgery

## 2019-10-01 ENCOUNTER — Encounter: Payer: Self-pay | Admitting: Vascular Surgery

## 2019-10-01 ENCOUNTER — Ambulatory Visit
Admission: RE | Admit: 2019-10-01 | Discharge: 2019-10-01 | Disposition: A | Discharge status: Home / Self Care | Payer: Managed Care, Other (non HMO) | Source: Ambulatory Visit | Attending: Radiation Oncology | Admitting: Radiation Oncology

## 2019-10-01 DIAGNOSIS — E119 Type 2 diabetes mellitus without complications: Secondary | ICD-10-CM | POA: Diagnosis not present

## 2019-10-01 DIAGNOSIS — Z885 Allergy status to narcotic agent status: Secondary | ICD-10-CM | POA: Diagnosis not present

## 2019-10-01 DIAGNOSIS — C7972 Secondary malignant neoplasm of left adrenal gland: Secondary | ICD-10-CM | POA: Diagnosis not present

## 2019-10-01 DIAGNOSIS — C3490 Malignant neoplasm of unspecified part of unspecified bronchus or lung: Secondary | ICD-10-CM | POA: Insufficient documentation

## 2019-10-01 DIAGNOSIS — C7971 Secondary malignant neoplasm of right adrenal gland: Secondary | ICD-10-CM | POA: Insufficient documentation

## 2019-10-01 DIAGNOSIS — F17210 Nicotine dependence, cigarettes, uncomplicated: Secondary | ICD-10-CM | POA: Diagnosis not present

## 2019-10-01 DIAGNOSIS — Z880 Allergy status to penicillin: Secondary | ICD-10-CM | POA: Insufficient documentation

## 2019-10-01 DIAGNOSIS — K219 Gastro-esophageal reflux disease without esophagitis: Secondary | ICD-10-CM | POA: Diagnosis not present

## 2019-10-01 DIAGNOSIS — C7951 Secondary malignant neoplasm of bone: Secondary | ICD-10-CM | POA: Insufficient documentation

## 2019-10-01 DIAGNOSIS — Z882 Allergy status to sulfonamides status: Secondary | ICD-10-CM | POA: Insufficient documentation

## 2019-10-01 DIAGNOSIS — C7931 Secondary malignant neoplasm of brain: Secondary | ICD-10-CM | POA: Insufficient documentation

## 2019-10-01 HISTORY — PX: PORTA CATH INSERTION: CATH118285

## 2019-10-01 HISTORY — DX: Malignant neoplasm of unspecified part of unspecified bronchus or lung: C34.90

## 2019-10-01 SURGERY — PORTA CATH INSERTION
Anesthesia: Moderate Sedation

## 2019-10-01 MED ORDER — DIPHENHYDRAMINE HCL 50 MG/ML IJ SOLN: 50.0000 mg | Freq: Once | INTRAMUSCULAR | Status: DC | PRN

## 2019-10-01 MED ORDER — ONDANSETRON HCL 4 MG/2ML IJ SOLN: 4.0000 mg | Freq: Four times a day (QID) | INTRAMUSCULAR | Status: DC | PRN

## 2019-10-01 MED ORDER — FENTANYL CITRATE (PF) 100 MCG/2ML IJ SOLN
INTRAMUSCULAR | Status: AC
  Filled 2019-10-01: qty 2

## 2019-10-01 MED ORDER — FENTANYL CITRATE (PF) 100 MCG/2ML IJ SOLN
INTRAMUSCULAR | Status: DC | PRN
  Administered 2019-10-01 (×3): 50 ug via INTRAVENOUS

## 2019-10-01 MED ORDER — METHYLPREDNISOLONE SODIUM SUCC 125 MG IJ SOLR: 125.0000 mg | Freq: Once | INTRAMUSCULAR | Status: DC | PRN

## 2019-10-01 MED ORDER — FENTANYL CITRATE (PF) 100 MCG/2ML IJ SOLN: 12.5000 ug | Freq: Once | INTRAMUSCULAR | Status: DC | PRN

## 2019-10-01 MED ORDER — MIDAZOLAM HCL 2 MG/ML PO SYRP: 8.0000 mg | ORAL_SOLUTION | Freq: Once | ORAL | Status: DC | PRN

## 2019-10-01 MED ORDER — CLINDAMYCIN PHOSPHATE 300 MG/50ML IV SOLN: 300.0000 mg | Freq: Once | INTRAVENOUS | Status: AC

## 2019-10-01 MED ORDER — PROMETHAZINE HCL 25 MG/ML IJ SOLN: 6.2500 mg | INTRAMUSCULAR | Status: DC | PRN

## 2019-10-01 MED ORDER — SODIUM CHLORIDE 0.9 % IV SOLN: INTRAVENOUS | Status: DC

## 2019-10-01 MED ORDER — MIDAZOLAM HCL 2 MG/2ML IJ SOLN
INTRAMUSCULAR | Status: DC | PRN
  Administered 2019-10-01: 2 mg via INTRAVENOUS
  Administered 2019-10-01 (×2): 1 mg via INTRAVENOUS

## 2019-10-01 MED ORDER — FENTANYL CITRATE (PF) 100 MCG/2ML IJ SOLN: 25.0000 ug | INTRAMUSCULAR | Status: DC | PRN

## 2019-10-01 MED ORDER — CLINDAMYCIN PHOSPHATE 300 MG/50ML IV SOLN
INTRAVENOUS | Status: AC
  Administered 2019-10-01: 300 mg via INTRAVENOUS
  Filled 2019-10-01: qty 50

## 2019-10-01 MED ORDER — FAMOTIDINE 20 MG PO TABS: 40.0000 mg | ORAL_TABLET | Freq: Once | ORAL | Status: DC | PRN

## 2019-10-01 MED ORDER — MIDAZOLAM HCL 5 MG/5ML IJ SOLN
INTRAMUSCULAR | Status: AC
  Filled 2019-10-01: qty 5

## 2019-10-01 MED ORDER — CHLORHEXIDINE GLUCONATE CLOTH 2 % EX PADS: 6.0000 | MEDICATED_PAD | Freq: Every day | CUTANEOUS | Status: DC

## 2019-10-01 MED ORDER — SODIUM CHLORIDE 0.9 % IV SOLN
INTRAVENOUS | Status: AC | PRN
  Administered 2019-10-01: 250 mL via INTRAVENOUS

## 2019-10-01 SURGICAL SUPPLY — 10 items
DERMABOND ADVANCED (GAUZE/BANDAGES/DRESSINGS) ×2
DERMABOND ADVANCED .7 DNX12 (GAUZE/BANDAGES/DRESSINGS) ×1 IMPLANT
DRAPE INCISE IOBAN 66X45 STRL (DRAPES) ×3 IMPLANT
KIT PORT POWER 8FR ISP CVUE (Port) ×3 IMPLANT
NEEDLE ENTRY 21GA 7CM ECHOTIP (NEEDLE) ×3 IMPLANT
PACK ANGIOGRAPHY (CUSTOM PROCEDURE TRAY) ×3 IMPLANT
SET INTRO CAPELLA COAXIAL (SET/KITS/TRAYS/PACK) ×3 IMPLANT
SUT MNCRL AB 4-0 PS2 18 (SUTURE) ×3 IMPLANT
SUT VIC AB 3-0 CT1 27 (SUTURE) ×2
SUT VIC AB 3-0 CT1 TAPERPNT 27 (SUTURE) ×1 IMPLANT

## 2019-10-01 NOTE — Op Note (Signed)
OPERATIVE NOTE   PROCEDURE: 1. Placement of a right IJ Infuse-a-Port  PRE-OPERATIVE DIAGNOSIS: Lung Carcinoma  POST-OPERATIVE DIAGNOSIS: [place]  SURGEON: Katha Cabal M.D.  ANESTHESIA: Conscious sedation was administered under my direct supervision by the interventional radiology RN. IV Versed plus fentanyl were utilized. Continuous ECG, pulse oximetry and blood pressure was monitored throughout the entire procedure. Conscious sedation was for a total of 30 minutes.  ESTIMATED BLOOD LOSS: Minimal   FINDING(S): 1.  Patent vein  SPECIMEN(S): None  INDICATIONS:   Sandra Leonard is a 64 y.o. female who presents with metastatic lung carcinoma.  She requires parenteral chemotherapy and therefore appropriate intravenous access.  Risk and benefits before placement of been reviewed all questions answered patient agrees to proceed..  DESCRIPTION: After obtaining full informed written consent, the patient was brought back to the special procedure suite and placed in the supine position. The patient's right neck and chest wall are prepped and draped in sterile fashion. Appropriate timeout was called.  Ultrasound is placed in a sterile sleeve, ultrasound is utilized to avoid vascular injury as well as secondary to lack of appropriate landmarks. The right internal jugular vein is identified. It is echolucent and homogeneous as well as easily compressible indicating patency. An image is recorded for the permanent record.  Access to the vein with a micropuncture needle is done under direct ultrasound visualization.  1% lidocaine is infiltrated into the soft tissue at the base of the neck as well as on the chest wall.  Under direct ultrasound visualization a micro-needle is inserted into the vein followed by the micro-wire. Micro-sheath was then advanced and a J wire is inserted without difficulty under fluoroscopic guidance. A small counterincision was created at the wire insertion site. A transverse  incision is created 2 fingerbreadths below the scapula and a pocket is fashioned using both blunt and sharp dissection. The pocket is tested for appropriate size with the hub of the Infuse-a-Port. The tunneling device is then used to pull the intravascular portion of the catheter from the pocket to the neck counterincision.  Dilator and peel-away sheath were then inserted over the wire and the wire is removed. Catheter is then advanced into the venous system without difficulty. Peel-away sheath was then removed.  Catheter is then positioned under fluoroscopic guidance at the atrial caval junction. It is then transected connected to the hub and the hope is slipped into the subcutaneous pocket on the chest wall. The hub was then accessed percutaneously and aspirates easily and flushes well and is flushed with 30 cc of heparinized saline. The pocket incision is then closed in layers using interrupted 3-0 Vicryl for the subcutaneous tissues and 4-0 Monocryl subcuticular for skin closure. Dermabond is applied. The neck counterincision was closed with 4-0 Monocryl subcuticular and Dermabond as well.  The patient tolerated the procedure well and there were no immediate complications.  COMPLICATIONS: None  CONDITION: Unchanged  Katha Cabal M.D. [facility] Office: [phone]   10/01/2019, 3:00 PM

## 2019-10-01 NOTE — Consult Note (Signed)
[facility] Admission History & Physical  MRN : [id number]  Sandra Leonard is a 64 y.o. (1955/07/12) female who presents with chief complaint of metastatic lung cancer  History of Present Illness: [name] is a 64 year old female who presented recently with left-sided numbness to her face.  Work-up in the emergency room included a CT scan which found a 3cm right upper lobe lung lesion. MRI of brain was completed showing brain metastasis.  PET scan confirmed hypermetabolic lymph nodes, bilateral adrenal gland metastasis and bone metastasis involving the upper left femur.  She was started on Decadron.  She recently had a bronchoscopy with Dr. Patsey Berthold which confirmed diagnosis.  She is already started radiation with Dr. Donella Stade for brain metastasis and left femur metastasis.  Patient will be starting chemotherapy in the near future and presents today for scheduled Port-A-Cath insertion.  No current facility-administered medications for this encounter.   Past Medical History:  Diagnosis Date  . Adrenal gland anomaly    bilateral lesion  . Brain lesion    x3  . Diabetes ([place])   . Diabetes mellitus without complication ([place])   . GERD (gastroesophageal reflux disease)   . Heartburn   . Lesion of left femur    Past Surgical History:  Procedure Laterality Date  . ABDOMINAL HYSTERECTOMY    . TONSILLECTOMY    . VIDEO BRONCHOSCOPY WITH ENDOBRONCHIAL ULTRASOUND N/A 09/21/2019   Procedure: VIDEO BRONCHOSCOPY WITH ENDOBRONCHIAL ULTRASOUND;  Surgeon: Tyler Pita, MD;  Location: [facility] ORS;  Service: Pulmonary;  Laterality: N/A;  [place] ON STANDBY   Social History Social History   Tobacco Use  . Smoking status: Current Every Day Smoker    Packs/day: 1.00    Years: 40.00    Pack years: 40.00  . Smokeless tobacco: Never Used  Vaping Use  . Vaping Use: Never used  Substance Use Topics  . Alcohol use: No    Alcohol/week: 0.0 standard drinks  . Drug use: No    Family History Family History  Problem Relation Age of Onset  . Breast cancer Mother 16  . Diabetes Mother   . Hypertension Mother   . Hypertension Father   . Diabetes Sister   . Hypertension Sister   . Diabetes Brother   Denies family history of peripheral artery disease, venous disease or renal disease.  Allergies  Allergen Reactions  . Codeine     "makes her crazy"  . Penicillins Rash  . Sulfa Antibiotics Rash   REVIEW OF SYSTEMS (Negative unless checked)  Constitutional: [] Weight loss  [] Fever  [] Chills Cardiac: [] Chest pain   [] Chest pressure   [] Palpitations   [] Shortness of breath when laying flat   [] Shortness of breath at rest   [] Shortness of breath with exertion. Vascular:  [] Pain in legs with walking   [] Pain in legs at rest   [] Pain in legs when laying flat   [] Claudication   [] Pain in feet when walking  [] Pain in feet at rest  [] Pain in feet when laying flat   [] History of DVT   [] Phlebitis   [] Swelling in legs   [] Varicose veins   [] Non-healing ulcers Pulmonary:   [] Uses home oxygen   [] Productive cough   [] Hemoptysis   [] Wheeze  [] COPD   [] Asthma Neurologic:  [] Dizziness  [] Blackouts   [] Seizures   [] History of stroke   [] History of TIA  [] Aphasia   [] Temporary blindness   [] Dysphagia   [] Weakness or numbness in arms   [] Weakness or  numbness in legs Musculoskeletal:  [] Arthritis   [] Joint swelling   [] Joint pain   [] Low back pain Hematologic:  [] Easy bruising  [] Easy bleeding   [] Hypercoagulable state   [] Anemic  [] Hepatitis Gastrointestinal:  [] Blood in stool   [] Vomiting blood  [] Gastroesophageal reflux/heartburn   [] Difficulty swallowing. Genitourinary:  [] Chronic kidney disease   [] Difficult urination  [] Frequent urination  [] Burning with urination   [] Blood in urine Skin:  [] Rashes   [] Ulcers   [] Wounds Psychological:  [] History of anxiety   []  History of major depression.  Positive for metastatic lung cancer  Physical Examination  There were no vitals  filed for this visit. There is no height or weight on file to calculate BMI. Gen: WD/WN, NAD Head: [place]/AT, No temporalis wasting.  Ear/Nose/Throat: Hearing grossly intact, nares w/o erythema or drainage, oropharynx w/o Erythema/Exudate, Eyes: Sclera non-icteric, conjunctiva clear Neck: Supple, no nuchal rigidity.  No JVD.  Pulmonary:  Good air movement, no increased work of respiration or use of accessory muscles  Cardiac: RRR, normal S1, S2, no Murmurs, rubs or gallops. Vascular:  Vessel Right Left  Radial Palpable Palpable  Ulnar Palpable Palpable  Brachial Palpable Palpable  Carotid Palpable, without bruit Palpable, without bruit  Aorta Not palpable N/A  Femoral Palpable Palpable  Popliteal Palpable Palpable  PT Palpable Palpable  DP Palpable Palpable   Gastrointestinal: soft, non-tender/non-distended. No guarding/reflex. No masses, surgical incisions, or scars. Musculoskeletal: M/S 5/5 throughout.  No deformity or atrophy.  Minimal edema Neurologic: Sensation grossly intact in extremities.  Symmetrical.  Speech is fluent. Motor exam as listed above. Psychiatric: Judgment intact, Mood & affect appropriate for pt's clinical situation. Dermatologic: No rashes or ulcers noted.  No cellulitis or open wounds. Lymph : No Cervical, Axillary, or Inguinal lymphadenopathy.  CBC Lab Results  Component Value Date   WBC 17.6 (H) 09/17/2019   HGB 15.7 (H) 09/17/2019   HCT 44.2 09/17/2019   MCV 86.7 09/17/2019   PLT 344 09/17/2019   BMET    Component Value Date/Time   NA 130 (L) 09/17/2019 0808   NA 143 10/09/2017 0810   K 4.3 09/17/2019 0808   CL 94 (L) 09/17/2019 0808   CO2 24 09/17/2019 0808   GLUCOSE 140 (H) 09/17/2019 0808   BUN 25 (H) 09/17/2019 0808   BUN 15 10/09/2017 0810   CREATININE 0.43 (L) 09/17/2019 0808   CALCIUM 8.8 (L) 09/17/2019 0808   GFRNONAA >60 09/17/2019 0808   GFRAA >60 09/17/2019 5102   Estimated Creatinine Clearance: 65.8 mL/min (A) (by C-G formula  based on SCr of 0.43 mg/dL (L)).  COAG No results found for: INR, PROTIME  Radiology NM PET Image Initial (PI) Skull Base To Thigh  Result Date: 09/07/2019 CLINICAL DATA:  Initial treatment strategy for right upper lobe lung mass and metastatic disease. EXAM: NUCLEAR MEDICINE PET SKULL BASE TO THIGH TECHNIQUE: 7.8 mCi F-18 FDG was injected intravenously. Full-ring PET imaging was performed from the skull base to thigh after the radiotracer. CT data was obtained and used for attenuation correction and anatomic localization. Fasting blood glucose: 118 mg/dl COMPARISON:  CT scan 08/27/2019 FINDINGS: Mediastinal blood pool activity: SUV max 1.89 Liver activity: SUV max NA NECK: No hypermetabolic lymph nodes in the neck. Incidental CT findings: Bilateral carotid artery calcifications. CHEST: Spiculated central right upper lobe lung lesion measuring 2.7 cm is directly invading the mediastinum. This has an SUV max of 6.37 and is consistent with neoplasm. There is also right hilar, right infrahilar and subcarinal lymphadenopathy.  The subcarinal lesion measures 15 mm and the SUV max is 8.50. Right hilar adenopathy has an SUV max of 5.03. Small scattered pulmonary nodules appears stable and are indeterminate but certainly worrisome for metastatic disease. The largest nodules in the right lower lobe and measures 5 mm. Two lower epicardial lymph nodes are hypermetabolic and consistent with nodal disease. The larger more medial lesion measures 11.5 mm and has an SUV max of 5.22. Incidental CT findings: Stable age advanced atherosclerotic calcifications involving the aorta and branch vessels including three-vessel coronary artery calcifications. ABDOMEN/PELVIS: Bilateral adrenal gland lesions are hypermetabolic and consistent with metastatic disease. The left adrenal gland lesion measures 16 mm and the SUV max is 9.13. The right adrenal gland lesion measures 10 mm and the SUV max is 4.03. No findings for hepatic  metastatic disease. No enlarged or hypermetabolic mesenteric or retroperitoneal adenopathy. Incidental CT findings: Advanced atherosclerotic calcifications involving the aorta and iliac arteries. SKELETON: Area of hypermetabolism is noted in the left upper femur posteriorly. This correlates with a cortical lesion just inferior to the lesser trochanter worrisome for a cortical metastasis. SUV max is 3.96. I do not see any other definite bone lesions. Incidental CT findings: none IMPRESSION: 1. Central right upper lobe lung lesion invading the mediastinum demonstrates hypermetabolism and is consistent with primary lung neoplasm. Associated right hilar, infrahilar and subcarinal metastatic adenopathy. 2. Small pulmonary nodules suspicious for pulmonary metastatic disease. 3. Two hypermetabolic lower epicardial lymph nodes consistent with metastasis. 4. Bilateral adrenal gland metastasis. 5. 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 Electronically Signed   By: Marijo Sanes M.D.   On: 09/07/2019 12:12   Assessment/Plan [name] is a 64 year old female who presented recently with left-sided numbness to her face.  Work-up in the emergency room included a CT scan which found a 3cm right upper lobe lung lesion. MRI of brain was completed showing brain metastasis.  PET scan confirmed hypermetabolic lymph nodes, bilateral adrenal gland metastasis and bone metastasis involving the upper left femur.  1.  Metastatic lung cancer: Patient will be started chemotherapy in the near future and presents today for a scheduled Port-A-Cath insertion.  Procedure, risks and benefits were explained to the patient.  All questions were answered.  The patient wished to proceed.  2.  Diabetes: On appropriate medications Encouraged good control as its slows the progression of atherosclerotic disease  3. GERD: On appropriate medications. Asymptomatic at this time.  Discussed with Dr.  Francene Castle, PA-C  10/01/2019 1:17 PM

## 2019-10-01 NOTE — Discharge Instructions (Signed)
Implanted Port Insertion, Care After This sheet gives you information about how to care for yourself after your procedure. Your health care provider may also give you more specific instructions. If you have problems or questions, contact your health care provider. What can I expect after the procedure? After the procedure, it is common to have:  Discomfort at the port insertion site.  Bruising on the skin over the port. This should improve over 3-4 days. Follow these instructions at home: [facility] care  After your port is placed, you will get a manufacturer's information card. The card has information about your port. Keep this card with you at all times.  Take care of the port as told by your health care provider. Ask your health care provider if you or a family member can get training for taking care of the port at home. A home health care nurse may also take care of the port.  Make sure to remember what type of port you have. Incision care      Follow instructions from your health care provider about how to take care of your port insertion site. Make sure you: ? Wash your hands with soap and water before and after you change your bandage (dressing). If soap and water are not available, use hand sanitizer. ? Change your dressing as told by your health care provider. ? Leave stitches (sutures), skin glue, or adhesive strips in place. These skin closures may need to stay in place for 2 weeks or longer. If adhesive strip edges start to loosen and curl up, you may trim the loose edges. Do not remove adhesive strips completely unless your health care provider tells you to do that.  Check your port insertion site every day for signs of infection. Check for: ? Redness, swelling, or pain. ? Fluid or blood. ? Warmth. ? Pus or a bad smell. Activity  Return to your normal activities as told by your health care provider. Ask your health care provider what activities are safe for you.  Do not  lift anything that is heavier than 10 lb (4.5 kg), or the limit that you are told, until your health care provider says that it is safe. General instructions  Take over-the-counter and prescription medicines only as told by your health care provider.  Do not take baths, swim, or use a hot tub until your health care provider approves. Ask your health care provider if you may take showers. You may only be allowed to take sponge baths.  Do not drive for 24 hours if you were given a sedative during your procedure.  Wear a medical alert bracelet in case of an emergency. This will tell any health care providers that you have a port.  Keep all follow-up visits as told by your health care provider. This is important. Contact a health care provider if:  You cannot flush your port with saline as directed, or you cannot draw blood from the port.  You have a fever or chills.  You have redness, swelling, or pain around your port insertion site.  You have fluid or blood coming from your port insertion site.  Your port insertion site feels warm to the touch.  You have pus or a bad smell coming from the port insertion site. Get help right away if:  You have chest pain or shortness of breath.  You have bleeding from your port that you cannot control. Summary  Take care of the port as told by your health  care provider. Keep the manufacturer's information card with you at all times.  Change your dressing as told by your health care provider.  Contact a health care provider if you have a fever or chills or if you have redness, swelling, or pain around your port insertion site.  Keep all follow-up visits as told by your health care provider. This information is not intended to replace advice given to you by your health care provider. Make sure you discuss any questions you have with your health care provider. Document Revised: 09/10/2017 Document Reviewed: 09/10/2017 Elsevier Patient Education   [place].    Moderate Conscious Sedation, Adult, Care After These instructions provide you with information about caring for yourself after your procedure. Your health care provider may also give you more specific instructions. Your treatment has been planned according to current medical practices, but problems sometimes occur. Call your health care provider if you have any problems or questions after your procedure. What can I expect after the procedure? After your procedure, it is common:  To feel sleepy for several hours.  To feel clumsy and have poor balance for several hours.  To have poor judgment for several hours.  To vomit if you eat too soon. Follow these instructions at home: For at least 24 hours after the procedure:   Do not: ? Participate in activities where you could fall or become injured. ? Drive. ? Use heavy machinery. ? Drink alcohol. ? Take sleeping pills or medicines that cause drowsiness. ? Make important decisions or sign legal documents. ? Take care of children on your own.  Rest. Eating and drinking  Follow the diet recommended by your health care provider.  If you vomit: ? Drink water, juice, or soup when you can drink without vomiting. ? Make sure you have little or no nausea before eating solid foods. General instructions  Have a responsible adult stay with you until you are awake and alert.  Take over-the-counter and prescription medicines only as told by your health care provider.  If you smoke, do not smoke without supervision.  Keep all follow-up visits as told by your health care provider. This is important. Contact a health care provider if:  You keep feeling nauseous or you keep vomiting.  You feel light-headed.  You develop a rash.  You have a fever. Get help right away if:  You have trouble breathing. This information is not intended to replace advice given to you by your health care provider. Make sure you  discuss any questions you have with your health care provider. Document Revised: 01/25/2017 Document Reviewed: 06/04/2015 Elsevier Patient Education  2020 [employer].

## 2019-10-02 ENCOUNTER — Ambulatory Visit
Admission: RE | Admit: 2019-10-02 | Discharge: 2019-10-02 | Disposition: A | Discharge status: Home / Self Care | Payer: Managed Care, Other (non HMO) | Source: Ambulatory Visit | Attending: Radiation Oncology | Admitting: Radiation Oncology

## 2019-10-02 DIAGNOSIS — Z510 Encounter for antineoplastic radiation therapy: Secondary | ICD-10-CM | POA: Diagnosis not present

## 2019-10-02 NOTE — Progress Notes (Signed)
[place]  [phone]) [phone] [phone]) [phone]  ID: [name] OB: [birth year]  MR#: [id number]  OEV#:[id number]  Patient Care Team: Barbaraann Boys, MD as PCP - General (Pediatrics) Telford Nab, RN as Oncology Nurse Navigator Grayland Ormond, Kathlene November, MD as Consulting Physician (Oncology)  CHIEF COMPLAINT: Stage IVb non-small cell lung cancer, NOS.  INTERVAL HISTORY: Patient returns to clinic today for further evaluation and initiation of cycle 1 of carboplatinum and Taxol.  She continues to have mild facial numbness and left foot drop, but otherwise feels well.  She continues with daily XRT and will finish later this week.  She has no other neurologic complaints.  She denies any recent fevers or illnesses.  She has a good appetite and denies weight loss.  She has no chest pain, shortness of breath, cough, or hemoptysis.  She denies any nausea, vomiting, constipation, or diarrhea.  She has no melena or hematochezia.  She has no urinary complaints.  Patient offers no further specific complaints today.  REVIEW OF SYSTEMS:   Review of Systems  Constitutional: Negative.  Negative for fever, malaise/fatigue and weight loss.  Respiratory: Negative.  Negative for cough, hemoptysis and shortness of breath.   Cardiovascular: Negative.  Negative for chest pain and leg swelling.  Gastrointestinal: Negative.  Negative for abdominal pain.  Genitourinary: Negative.  Negative for dysuria.  Musculoskeletal: Negative.  Negative for back pain.  Skin: Negative.  Negative for rash.  Neurological: Positive for tingling and focal weakness. Negative for dizziness, speech change, weakness and headaches.  Psychiatric/Behavioral: Negative.  The patient is not nervous/anxious.     As per HPI. Otherwise, a complete review of systems is negative.  PAST MEDICAL HISTORY: Past Medical History:  Diagnosis Date  . Adrenal gland anomaly    bilateral lesion  . Brain lesion    x3  . Diabetes  ([place])   . Diabetes mellitus without complication ([place])   . GERD (gastroesophageal reflux disease)   . Heartburn   . Lesion of left femur   . Lung cancer ([place])     PAST SURGICAL HISTORY: Past Surgical History:  Procedure Laterality Date  . ABDOMINAL HYSTERECTOMY    . CESAREAN SECTION     x2  . PORTA CATH INSERTION N/A 10/01/2019   Procedure: PORTA CATH INSERTION;  Surgeon: Katha Cabal, MD;  Location: [facility];  Service: Cardiovascular;  Laterality: N/A;  . TONSILLECTOMY    . VIDEO BRONCHOSCOPY WITH ENDOBRONCHIAL ULTRASOUND N/A 09/21/2019   Procedure: VIDEO BRONCHOSCOPY WITH ENDOBRONCHIAL ULTRASOUND;  Surgeon: Tyler Pita, MD;  Location: [facility];  Service: Pulmonary;  Laterality: N/A;  FLURO ON STANDBY    FAMILY HISTORY: Family History  Problem Relation Age of Onset  . Breast cancer Mother 10  . Diabetes Mother   . Hypertension Mother   . Hypertension Father   . Diabetes Sister   . Hypertension Sister   . Diabetes Brother     ADVANCED DIRECTIVES (Y/N):  N  HEALTH MAINTENANCE: Social History   Tobacco Use  . Smoking status: Current Every Day Smoker    Packs/day: 0.50    Years: 40.00    Pack years: 20.00  . Smokeless tobacco: Never Used  . Tobacco comment: 10 cigarettes a day  Vaping Use  . Vaping Use: Never used  Substance Use Topics  . Alcohol use: No    Alcohol/week: 0.0 standard drinks  . Drug use: No     Colonoscopy:  PAP:  Bone density:  Lipid  panel:  Allergies  Allergen Reactions  . Codeine     "makes her crazy"  . Penicillins Rash  . Sulfa Antibiotics Rash    Current Outpatient Medications  Medication Sig Dispense Refill  . atorvastatin (LIPITOR) 10 MG tablet Take 10 mg by mouth daily.    Marland Kitchen dexamethasone (DECADRON) 4 MG tablet Take 1 tablet (4 mg total) by mouth in the morning and at bedtime. 60 tablet 0  . diphenhydrAMINE (BENADRYL ALLERGY) 25 MG tablet Take 25 mg by mouth at bedtime as needed for sleep.     .  empagliflozin (JARDIANCE) 10 MG TABS tablet Take 10 mg by mouth at bedtime.     Marland Kitchen esomeprazole (NEXIUM) 40 MG capsule Take 40 mg by mouth daily.    . fluconazole (DIFLUCAN) 150 MG tablet Take 1 tablet (150 mg total) by mouth daily. 10 tablet 0  . HYDROcodone-acetaminophen (NORCO) 5-325 MG tablet Take 1 tablet by mouth every 6 (six) hours as needed for moderate pain. 30 tablet 0  . lidocaine-prilocaine (EMLA) cream Apply to affected area once 30 g 3  . magic mouthwash w/lidocaine SOLN Take 5 mLs by mouth 4 (four) times daily. 240 mL 0  . metFORMIN (GLUCOPHAGE) 1000 MG tablet Take 1 tablet (1,000 mg total) by mouth 2 (two) times daily with a meal. 180 tablet 3  . nicotine (NICODERM CQ - DOSED IN MG/24 HOURS) 21 mg/24hr patch Place onto the skin.    . nicotine (NICOTROL) 10 MG inhaler Inhale 1 Cartridge (1 continuous puffing total) into the lungs as needed for smoking cessation. 42 each 0  . omeprazole (PRILOSEC) 20 MG capsule TAKE 1 CAPSULE (20 MG TOTAL) BY MOUTH DAILY. 30 capsule 6  . ondansetron (ZOFRAN) 8 MG tablet Take 1 tablet (8 mg total) by mouth 2 (two) times daily as needed for refractory nausea / vomiting. 60 tablet 2  . ONE TOUCH ULTRA TEST test strip TEST BLOOD SUGAR IN THE MORNING AND 2 HOURS AFTER LARGEST MEAL 50 each 2  . prochlorperazine (COMPAZINE) 10 MG tablet Take 1 tablet (10 mg total) by mouth every 6 (six) hours as needed (Nausea or vomiting). 60 tablet 2  . sertraline (ZOLOFT) 50 MG tablet Take half a tablet ('25mg'$ ) by mouth daily x 1 week and then increase to one tablet ('50mg'$ ) daily (Patient taking differently: Take 50 mg by mouth daily. ) 30 tablet 2  . traZODone (DESYREL) 100 MG tablet Take 1-2 tablets (100-200 mg total) by mouth at bedtime as needed for sleep. 60 tablet 2  . fluconazole (DIFLUCAN) 100 MG tablet Take 1 tablet (100 mg total) by mouth daily. (Patient not taking: Reported on 10/01/2019) 7 tablet 0  . lisinopril (ZESTRIL) 5 MG tablet Take 5 mg by mouth daily.  (Patient not taking: Reported on 10/01/2019)     No current facility-administered medications for this visit.   Facility-Administered Medications Ordered in Other Visits  Medication Dose Route Frequency Provider Last Rate Last Admin  . CARBOplatin (PARAPLATIN) 520 mg in sodium chloride 0.9 % 250 mL chemo infusion  520 mg Intravenous Once Lloyd Huger, MD 604 mL/hr at 10/05/19 1643 520 mg at 10/05/19 1643  . heparin lock flush 100 unit/mL  500 Units Intravenous Once Lloyd Huger, MD      . heparin lock flush 100 unit/mL  500 Units Intracatheter Once PRN Lloyd Huger, MD        OBJECTIVE: Vitals:   10/05/19 0951  BP: (!) 87/67  Pulse: 79  Temp: (!) 94.5 F (34.7 C)  SpO2: 99%     Body mass index is 26.81 kg/m.    ECOG FS:0 - Asymptomatic  General: Well-developed, well-nourished, no acute distress. Eyes: Pink conjunctiva, anicteric sclera. HEENT: Normocephalic, moist mucous membranes. Lungs: No audible wheezing or coughing. Heart: Regular rate and rhythm. Abdomen: Soft, nontender, no obvious distention. Musculoskeletal: No edema, cyanosis, or clubbing. Neuro: Alert, answering all questions appropriately. Cranial nerves grossly intact. Skin: No rashes or petechiae noted. Psych: Normal affect.  LAB RESULTS:  Lab Results  Component Value Date   NA 134 (L) 10/05/2019   K 4.2 10/05/2019   CL 99 10/05/2019   CO2 22 10/05/2019   GLUCOSE 198 (H) 10/05/2019   BUN 24 (H) 10/05/2019   CREATININE 0.53 10/05/2019   CALCIUM 8.4 (L) 10/05/2019   PROT 6.3 (L) 10/05/2019   ALBUMIN 3.5 10/05/2019   AST 23 10/05/2019   ALT 40 10/05/2019   ALKPHOS 63 10/05/2019   BILITOT 0.5 10/05/2019   GFRNONAA >60 10/05/2019   GFRAA >60 10/05/2019    Lab Results  Component Value Date   WBC 12.5 (H) 10/05/2019   NEUTROABS 10.1 (H) 10/05/2019   HGB 14.6 10/05/2019   HCT 41.5 10/05/2019   MCV 89.1 10/05/2019   PLT 298 10/05/2019     STUDIES: PERIPHERAL VASCULAR  CATHETERIZATION  Result Date: 10/01/2019 See op note  NM PET Image Initial (PI) Skull Base To Thigh  Result Date: 09/07/2019 CLINICAL DATA:  Initial treatment strategy for right upper lobe lung mass and metastatic disease. EXAM: NUCLEAR MEDICINE PET SKULL BASE TO THIGH TECHNIQUE: 7.8 mCi F-18 FDG was injected intravenously. Full-ring PET imaging was performed from the skull base to thigh after the radiotracer. CT data was obtained and used for attenuation correction and anatomic localization. Fasting blood glucose: 118 mg/dl COMPARISON:  CT scan 08/27/2019 FINDINGS: Mediastinal blood pool activity: SUV max 1.89 Liver activity: SUV max NA NECK: No hypermetabolic lymph nodes in the neck. Incidental CT findings: Bilateral carotid artery calcifications. CHEST: Spiculated central right upper lobe lung lesion measuring 2.7 cm is directly invading the mediastinum. This has an SUV max of 6.37 and is consistent with neoplasm. There is also right hilar, right infrahilar and subcarinal lymphadenopathy. The subcarinal lesion measures 15 mm and the SUV max is 8.50. Right hilar adenopathy has an SUV max of 5.03. Small scattered pulmonary nodules appears stable and are indeterminate but certainly worrisome for metastatic disease. The largest nodules in the right lower lobe and measures 5 mm. Two lower epicardial lymph nodes are hypermetabolic and consistent with nodal disease. The larger more medial lesion measures 11.5 mm and has an SUV max of 5.22. Incidental CT findings: Stable age advanced atherosclerotic calcifications involving the aorta and branch vessels including three-vessel coronary artery calcifications. ABDOMEN/PELVIS: Bilateral adrenal gland lesions are hypermetabolic and consistent with metastatic disease. The left adrenal gland lesion measures 16 mm and the SUV max is 9.13. The right adrenal gland lesion measures 10 mm and the SUV max is 4.03. No findings for hepatic metastatic disease. No enlarged or  hypermetabolic mesenteric or retroperitoneal adenopathy. Incidental CT findings: Advanced atherosclerotic calcifications involving the aorta and iliac arteries. SKELETON: Area of hypermetabolism is noted in the left upper femur posteriorly. This correlates with a cortical lesion just inferior to the lesser trochanter worrisome for a cortical metastasis. SUV max is 3.96. I do not see any other definite bone lesions. Incidental CT findings: none IMPRESSION: 1. Central right upper lobe lung lesion invading  the mediastinum demonstrates hypermetabolism and is consistent with primary lung neoplasm. Associated right hilar, infrahilar and subcarinal metastatic adenopathy. 2. Small pulmonary nodules suspicious for pulmonary metastatic disease. 3. Two hypermetabolic lower epicardial lymph nodes consistent with metastasis. 4. Bilateral adrenal gland metastasis. 5. 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 Electronically Signed   By: Marijo Sanes M.D.   On: 09/07/2019 12:12    ASSESSMENT: Stage IVb non-small cell lung cancer, NOS.  PLAN:    1. Stage IVb non-small cell lung cancer, NOS: Biopsy consistent with non-small cell carcinoma of lung.  There was insufficient tissue and too much necrosis for further mutational or PD-L1 testing. Liquid biopsy is pending at time of dictation.  PET scan results from September 07, 2019 reviewed independently with right upper lobe lung lesion invading the mediastinum, small bilateral pulmonary nodules, and bilateral adrenal metastasis.  Patient also has cortical bone metastasis in left femur.  MRI of the brain was completed at [facility] and is located in care everywhere.  Continue daily XRT to brain and left femur.  Since malignancy cannot be further specified other than non-small cell carcinoma, patient will receive carboplatinum and Taxol with Udenyca support every 3 weeks for 4 cycles and then reimage.  Will consider changing treatment plan if liquid biopsy  provides further information.  Proceed with cycle 1 of carboplatinum and Taxol today.  Return to clinic in 2 days for Udenyca, 1 week for laboratory work and further evaluation, and then in 3 weeks for consideration of cycle 2. 2.  Facial numbness: Improving.  Secondary to brain metastasis.  Continue Decadron and XRT as above. 3.  Brain metastasis: Decadron and XRT as above. 4.  Left femur lesion: XRT as above. 5.  Foot drop: Appreciate Occupational Therapy input. 6.  Hypotension: Patient is asymptomatic.  She will receive IV fluids with her treatment and then encouraged increased fluid intake. 7.  Leukocytosis: Likely secondary to Decadron, monitor.   Patient expressed understanding and was in agreement with this plan. She also understands that She can call clinic at any time with any questions, concerns, or complaints.   Cancer Staging Non-small cell carcinoma of lung, right [facility]) Staging form: Lung, AJCC 8th Edition - Clinical stage from 09/26/2019: Stage IVB (cT4, cN3, cM1c) - Signed by Lloyd Huger, MD on 09/26/2019   Lloyd Huger, MD   10/05/2019 4:55 PM

## 2019-10-05 ENCOUNTER — Inpatient Hospital Stay: Payer: Managed Care, Other (non HMO)

## 2019-10-05 ENCOUNTER — Ambulatory Visit
Admission: RE | Admit: 2019-10-05 | Discharge: 2019-10-05 | Disposition: A | Discharge status: Home / Self Care | Payer: Managed Care, Other (non HMO) | Source: Ambulatory Visit | Attending: Radiation Oncology | Admitting: Radiation Oncology

## 2019-10-05 ENCOUNTER — Encounter: Payer: Self-pay | Admitting: *Deleted

## 2019-10-05 ENCOUNTER — Other Ambulatory Visit: Payer: Self-pay

## 2019-10-05 ENCOUNTER — Inpatient Hospital Stay (HOSPITAL_BASED_OUTPATIENT_CLINIC_OR_DEPARTMENT_OTHER): Payer: Managed Care, Other (non HMO) | Admitting: Oncology

## 2019-10-05 ENCOUNTER — Encounter: Payer: Self-pay | Admitting: Vascular Surgery

## 2019-10-05 VITALS — BP 87/67 | HR 79 | Temp 94.5°F | Wt 146.6 lb

## 2019-10-05 VITALS — BP 96/78 | HR 75 | Temp 96.2°F | Resp 16

## 2019-10-05 DIAGNOSIS — C3491 Malignant neoplasm of unspecified part of right bronchus or lung: Secondary | ICD-10-CM

## 2019-10-05 DIAGNOSIS — Z510 Encounter for antineoplastic radiation therapy: Secondary | ICD-10-CM | POA: Diagnosis not present

## 2019-10-05 LAB — CBC WITH DIFFERENTIAL/PLATELET
Abs Immature Granulocytes: 0.29 10*3/uL — ABNORMAL HIGH (ref 0.00–0.07)
Basophils Absolute: 0 10*3/uL (ref 0.0–0.1)
Basophils Relative: 0 %
Eosinophils Absolute: 0 10*3/uL (ref 0.0–0.5)
Eosinophils Relative: 0 %
HCT: 41.5 % (ref 36.0–46.0)
Hemoglobin: 14.6 g/dL (ref 12.0–15.0)
Immature Granulocytes: 2 %
Lymphocytes Relative: 9 %
Lymphs Abs: 1.1 10*3/uL (ref 0.7–4.0)
MCH: 31.3 pg (ref 26.0–34.0)
MCHC: 35.2 g/dL (ref 30.0–36.0)
MCV: 89.1 fL (ref 80.0–100.0)
Monocytes Absolute: 1 10*3/uL (ref 0.1–1.0)
Monocytes Relative: 8 %
Neutro Abs: 10.1 10*3/uL — ABNORMAL HIGH (ref 1.7–7.7)
Neutrophils Relative %: 81 %
Platelets: 298 10*3/uL (ref 150–400)
RBC: 4.66 MIL/uL (ref 3.87–5.11)
RDW: 13.9 % (ref 11.5–15.5)
WBC: 12.5 10*3/uL — ABNORMAL HIGH (ref 4.0–10.5)
nRBC: 0 % (ref 0.0–0.2)

## 2019-10-05 LAB — COMPREHENSIVE METABOLIC PANEL
ALT: 40 U/L (ref 0–44)
AST: 23 U/L (ref 15–41)
Albumin: 3.5 g/dL (ref 3.5–5.0)
Alkaline Phosphatase: 63 U/L (ref 38–126)
Anion gap: 13 (ref 5–15)
BUN: 24 mg/dL — ABNORMAL HIGH (ref 8–23)
CO2: 22 mmol/L (ref 22–32)
Calcium: 8.4 mg/dL — ABNORMAL LOW (ref 8.9–10.3)
Chloride: 99 mmol/L (ref 98–111)
Creatinine, Ser: 0.53 mg/dL (ref 0.44–1.00)
GFR calc Af Amer: >60 mL/min (ref >60)
GFR calc non Af Amer: >60 mL/min (ref >60)
Glucose, Bld: 198 mg/dL — ABNORMAL HIGH (ref 70–99)
Potassium: 4.2 mmol/L (ref 3.5–5.1)
Sodium: 134 mmol/L — ABNORMAL LOW (ref 135–145)
Total Bilirubin: 0.5 mg/dL (ref 0.3–1.2)
Total Protein: 6.3 g/dL — ABNORMAL LOW (ref 6.5–8.1)

## 2019-10-05 MED ORDER — SODIUM CHLORIDE 0.9 % IV SOLN
10.0000 mg | Freq: Once | INTRAVENOUS | Status: AC
  Administered 2019-10-05: 10 mg via INTRAVENOUS
  Filled 2019-10-05: qty 10

## 2019-10-05 MED ORDER — HEPARIN SOD (PORK) LOCK FLUSH 100 UNIT/ML IV SOLN
INTRAVENOUS | Status: AC
  Filled 2019-10-05: qty 5

## 2019-10-05 MED ORDER — SODIUM CHLORIDE 0.9 % IV SOLN
Freq: Once | INTRAVENOUS | Status: AC
  Filled 2019-10-05: qty 250

## 2019-10-05 MED ORDER — PALONOSETRON HCL INJECTION 0.25 MG/5ML
0.2500 mg | Freq: Once | INTRAVENOUS | Status: AC
  Administered 2019-10-05: 0.25 mg via INTRAVENOUS
  Filled 2019-10-05: qty 5

## 2019-10-05 MED ORDER — FAMOTIDINE IN NACL 20-0.9 MG/50ML-% IV SOLN
20.0000 mg | Freq: Once | INTRAVENOUS | Status: AC
  Administered 2019-10-05: 20 mg via INTRAVENOUS
  Filled 2019-10-05: qty 50

## 2019-10-05 MED ORDER — SODIUM CHLORIDE 0.9 % IV SOLN
225.0000 mg/m2 | Freq: Once | INTRAVENOUS | Status: AC
  Administered 2019-10-05: 390 mg via INTRAVENOUS
  Filled 2019-10-05: qty 65

## 2019-10-05 MED ORDER — SODIUM CHLORIDE 0.9 % IV SOLN
150.0000 mg | Freq: Once | INTRAVENOUS | Status: AC
  Administered 2019-10-05: 150 mg via INTRAVENOUS
  Filled 2019-10-05: qty 5

## 2019-10-05 MED ORDER — HEPARIN SOD (PORK) LOCK FLUSH 100 UNIT/ML IV SOLN
500.0000 [IU] | Freq: Once | INTRAVENOUS | Status: DC | PRN
  Filled 2019-10-05: qty 5

## 2019-10-05 MED ORDER — SODIUM CHLORIDE 0.9% FLUSH
10.0000 mL | Freq: Once | INTRAVENOUS | Status: AC
  Administered 2019-10-05: 10 mL via INTRAVENOUS
  Filled 2019-10-05: qty 10

## 2019-10-05 MED ORDER — DIPHENHYDRAMINE HCL 50 MG/ML IJ SOLN
25.0000 mg | Freq: Once | INTRAMUSCULAR | Status: AC
  Administered 2019-10-05: 25 mg via INTRAVENOUS
  Filled 2019-10-05: qty 1

## 2019-10-05 MED ORDER — HEPARIN SOD (PORK) LOCK FLUSH 100 UNIT/ML IV SOLN
500.0000 [IU] | Freq: Once | INTRAVENOUS | Status: AC
  Administered 2019-10-05: 500 [IU] via INTRAVENOUS
  Filled 2019-10-05: qty 5

## 2019-10-05 MED ORDER — SODIUM CHLORIDE 0.9 % IV SOLN
516.0000 mg | Freq: Once | INTRAVENOUS | Status: AC
  Administered 2019-10-05: 520 mg via INTRAVENOUS
  Filled 2019-10-05: qty 52

## 2019-10-05 NOTE — Progress Notes (Signed)
  Oncology Nurse Navigator Documentation  Navigator Location: [facility]Med Onc (10/05/19 1000)   )Navigator Encounter Type: Follow-up Appt;Treatment (10/05/19 1000)                     Patient Visit Type: MedOnc (10/05/19 1000) Treatment Phase: First Chemo Tx (10/05/19 1000) Barriers/Navigation Needs: No Barriers At This Time (10/05/19 1000)   Interventions: None Required (10/05/19 1000)         met with patient during follow up visit with Dr. Grayland Ormond to start chemotherapy treatments today. All questions answered during visit. Reviewed upcoming appts. Nothing further needed at this time. Instructed pt to call with any further questions or needs. Pt verbalized understanding.              Time Spent with Patient: 30 (10/05/19 1000)

## 2019-10-05 NOTE — Progress Notes (Signed)
Per MD ok to treat with BP

## 2019-10-06 ENCOUNTER — Other Ambulatory Visit: Payer: Self-pay | Admitting: *Deleted

## 2019-10-06 ENCOUNTER — Telehealth: Payer: Self-pay

## 2019-10-06 ENCOUNTER — Ambulatory Visit
Admission: RE | Admit: 2019-10-06 | Discharge: 2019-10-06 | Disposition: A | Discharge status: Home / Self Care | Payer: Managed Care, Other (non HMO) | Source: Ambulatory Visit | Attending: Radiation Oncology | Admitting: Radiation Oncology

## 2019-10-06 DIAGNOSIS — Z510 Encounter for antineoplastic radiation therapy: Secondary | ICD-10-CM | POA: Diagnosis not present

## 2019-10-06 MED ORDER — DEXAMETHASONE 4 MG PO TABS
4.0000 mg | ORAL_TABLET | Freq: Two times a day (BID) | ORAL | 0 refills | Status: DC
Start: 2019-10-06 — End: 2019-10-13

## 2019-10-06 NOTE — Telephone Encounter (Signed)
T/C to patient for follow up after receiving first chemo but no answer.  Left message stating we were calling to check on her after first infusion and to call for any questions or concerns.

## 2019-10-07 ENCOUNTER — Other Ambulatory Visit: Payer: Self-pay

## 2019-10-07 ENCOUNTER — Ambulatory Visit
Admission: RE | Admit: 2019-10-07 | Discharge: 2019-10-07 | Disposition: A | Discharge status: Home / Self Care | Payer: Managed Care, Other (non HMO) | Source: Ambulatory Visit | Attending: Radiation Oncology | Admitting: Radiation Oncology

## 2019-10-07 ENCOUNTER — Inpatient Hospital Stay: Payer: Managed Care, Other (non HMO)

## 2019-10-07 DIAGNOSIS — C3491 Malignant neoplasm of unspecified part of right bronchus or lung: Secondary | ICD-10-CM

## 2019-10-07 DIAGNOSIS — Z510 Encounter for antineoplastic radiation therapy: Secondary | ICD-10-CM | POA: Diagnosis not present

## 2019-10-07 MED ORDER — PEGFILGRASTIM-JMDB 6 MG/0.6ML ~~LOC~~ SOSY
6.0000 mg | PREFILLED_SYRINGE | Freq: Once | SUBCUTANEOUS | Status: AC
  Administered 2019-10-07: 6 mg via SUBCUTANEOUS
  Filled 2019-10-07: qty 0.6

## 2019-10-08 ENCOUNTER — Ambulatory Visit
Admission: RE | Admit: 2019-10-08 | Discharge: 2019-10-08 | Disposition: A | Discharge status: Home / Self Care | Payer: Managed Care, Other (non HMO) | Source: Ambulatory Visit | Attending: Radiation Oncology | Admitting: Radiation Oncology

## 2019-10-08 ENCOUNTER — Other Ambulatory Visit: Payer: Self-pay | Admitting: *Deleted

## 2019-10-08 DIAGNOSIS — Z510 Encounter for antineoplastic radiation therapy: Secondary | ICD-10-CM | POA: Diagnosis not present

## 2019-10-08 MED ORDER — HYDROCODONE-ACETAMINOPHEN 5-325 MG PO TABS: 1.0000 | ORAL_TABLET | Freq: Four times a day (QID) | ORAL | 0 refills | Status: DC | PRN

## 2019-10-08 NOTE — Telephone Encounter (Signed)
Pt requests refill of hydrocodone.

## 2019-10-09 ENCOUNTER — Other Ambulatory Visit: Payer: Self-pay

## 2019-10-09 ENCOUNTER — Telehealth: Payer: Self-pay

## 2019-10-09 ENCOUNTER — Other Ambulatory Visit: Payer: Self-pay | Admitting: Oncology

## 2019-10-09 NOTE — Progress Notes (Signed)
[name]  Telephone:(336) [phone] Fax:(336) [phone]  ID: Sandra Leonard OB: 1955/06/01  MR#: [id number]  URK#:[id number]  Patient Care Team: Barbaraann Boys, MD as PCP - General (Pediatrics) Telford Nab, RN as Oncology Nurse Navigator Grayland Ormond, Kathlene November, MD as Consulting Physician (Oncology)  CHIEF COMPLAINT: Stage IVb non-small cell lung cancer, NOS.  INTERVAL HISTORY: Patient returns to clinic today for further evaluation and to assess her toleration of cycle 1 of carboplatin and Taxol.  She tolerated treatment well without significant side effects.  She continues to have left foot drop and leg weakness, but otherwise feels well.  She has no other neurologic complaints.  She denies any recent fevers or illnesses.  She has a good appetite and denies weight loss.  She has no chest pain, shortness of breath, cough, or hemoptysis.  She denies any nausea, vomiting, constipation, or diarrhea.  She has no melena or hematochezia.  She has no urinary complaints.  Patient offers no further specific complaints today.  REVIEW OF SYSTEMS:   Review of Systems  Constitutional: Negative.  Negative for fever, malaise/fatigue and weight loss.  Respiratory: Negative.  Negative for cough, hemoptysis and shortness of breath.   Cardiovascular: Negative.  Negative for chest pain and leg swelling.  Gastrointestinal: Negative.  Negative for abdominal pain.  Genitourinary: Negative.  Negative for dysuria.  Musculoskeletal: Negative.  Negative for back pain.  Skin: Negative.  Negative for rash.  Neurological: Positive for tingling and focal weakness. Negative for dizziness, speech change, weakness and headaches.  Psychiatric/Behavioral: Negative.  The patient is not nervous/anxious.     As per HPI. Otherwise, a complete review of systems is negative.  PAST MEDICAL HISTORY: Past Medical History:  Diagnosis Date  . Adrenal gland anomaly    bilateral lesion  . Brain lesion    x3  .  Diabetes ([place])   . Diabetes mellitus without complication ([place])   . GERD (gastroesophageal reflux disease)   . Heartburn   . Lesion of left femur   . Lung cancer ([place])     PAST SURGICAL HISTORY: Past Surgical History:  Procedure Laterality Date  . ABDOMINAL HYSTERECTOMY    . CESAREAN SECTION     x2  . PORTA CATH INSERTION N/A 10/01/2019   Procedure: PORTA CATH INSERTION;  Surgeon: Katha Cabal, MD;  Location: [facility];  Service: Cardiovascular;  Laterality: N/A;  . TONSILLECTOMY    . VIDEO BRONCHOSCOPY WITH ENDOBRONCHIAL ULTRASOUND N/A 09/21/2019   Procedure: VIDEO BRONCHOSCOPY WITH ENDOBRONCHIAL ULTRASOUND;  Surgeon: Tyler Pita, MD;  Location: [facility];  Service: Pulmonary;  Laterality: N/A;  FLURO ON STANDBY    FAMILY HISTORY: Family History  Problem Relation Age of Onset  . Breast cancer Mother 72  . Diabetes Mother   . Hypertension Mother   . Hypertension Father   . Diabetes Sister   . Hypertension Sister   . Diabetes Brother     ADVANCED DIRECTIVES (Y/N):  N  HEALTH MAINTENANCE: Social History   Tobacco Use  . Smoking status: Current Every Day Smoker    Packs/day: 0.50    Years: 40.00    Pack years: 20.00  . Smokeless tobacco: Never Used  . Tobacco comment: 10 cigarettes a day  Vaping Use  . Vaping Use: Never used  Substance Use Topics  . Alcohol use: No    Alcohol/week: 0.0 standard drinks  . Drug use: No     Colonoscopy:  PAP:  Bone density:  Lipid panel:  Allergies  Allergen Reactions  . Codeine     "makes her crazy"  . Penicillins Rash  . Sulfa Antibiotics Rash    Current Outpatient Medications  Medication Sig Dispense Refill  . atorvastatin (LIPITOR) 10 MG tablet Take 10 mg by mouth daily.    Marland Kitchen dexamethasone (DECADRON) 4 MG tablet Take 1 tablet (4 mg total) by mouth in the morning and at bedtime. 60 tablet 0  . dexamethasone (DECADRON) 4 MG tablet Take 1 tablet (4 mg total) by mouth 2 (two) times daily with a meal.  Follow taper instructions that were given. 30 tablet 0  . diphenhydrAMINE (BENADRYL ALLERGY) 25 MG tablet Take 25 mg by mouth at bedtime as needed for sleep.     . empagliflozin (JARDIANCE) 10 MG TABS tablet Take 10 mg by mouth at bedtime.     Marland Kitchen esomeprazole (NEXIUM) 40 MG capsule Take 40 mg by mouth daily.    . fluconazole (DIFLUCAN) 100 MG tablet Take 1 tablet (100 mg total) by mouth daily. 7 tablet 0  . fluconazole (DIFLUCAN) 150 MG tablet Take 1 tablet (150 mg total) by mouth daily. 10 tablet 0  . HYDROcodone-acetaminophen (NORCO) 5-325 MG tablet Take 1 tablet by mouth every 6 (six) hours as needed for moderate pain. 30 tablet 0  . lidocaine-prilocaine (EMLA) cream Apply to affected area once 30 g 3  . lisinopril (ZESTRIL) 5 MG tablet Take 5 mg by mouth daily.     . magic mouthwash w/lidocaine SOLN Take 5 mLs by mouth 4 (four) times daily. 240 mL 0  . metFORMIN (GLUCOPHAGE) 1000 MG tablet Take 1 tablet (1,000 mg total) by mouth 2 (two) times daily with a meal. 180 tablet 3  . nicotine (NICODERM CQ - DOSED IN MG/24 HOURS) 21 mg/24hr patch Place onto the skin.    . nicotine (NICOTROL) 10 MG inhaler Inhale 1 Cartridge (1 continuous puffing total) into the lungs as needed for smoking cessation. 42 each 0  . omeprazole (PRILOSEC) 20 MG capsule TAKE 1 CAPSULE (20 MG TOTAL) BY MOUTH DAILY. 30 capsule 6  . ondansetron (ZOFRAN) 8 MG tablet Take 1 tablet (8 mg total) by mouth 2 (two) times daily as needed for refractory nausea / vomiting. 60 tablet 2  . ONE TOUCH ULTRA TEST test strip TEST BLOOD SUGAR IN THE MORNING AND 2 HOURS AFTER LARGEST MEAL 50 each 2  . prochlorperazine (COMPAZINE) 10 MG tablet Take 1 tablet (10 mg total) by mouth every 6 (six) hours as needed (Nausea or vomiting). 60 tablet 2  . sertraline (ZOLOFT) 50 MG tablet Take half a tablet (8m) by mouth daily x 1 week and then increase to one tablet (568m daily (Patient taking differently: Take 50 mg by mouth daily. ) 30 tablet 2  .  traZODone (DESYREL) 100 MG tablet Take 1-2 tablets (100-200 mg total) by mouth at bedtime as needed for sleep. 60 tablet 2   No current facility-administered medications for this visit.    OBJECTIVE: Vitals:   10/12/19 1107  BP: 117/70  Pulse: (!) 102  Resp: 20  Temp: 98.7 F (37.1 C)  SpO2: 99%     Body mass index is 26.81 kg/m.    ECOG FS:0 - Asymptomatic  General: Well-developed, well-nourished, no acute distress. Eyes: Pink conjunctiva, anicteric sclera. HEENT: Normocephalic, moist mucous membranes. Lungs: No audible wheezing or coughing. Heart: Regular rate and rhythm. Abdomen: Soft, nontender, no obvious distention. Musculoskeletal: No edema, cyanosis, or clubbing. Neuro: Alert, answering all questions appropriately.  Cranial nerves grossly intact. Skin: No rashes or petechiae noted. Psych: Normal affect.  LAB RESULTS:  Lab Results  Component Value Date   NA 137 10/12/2019   K 4.0 10/12/2019   CL 103 10/12/2019   CO2 22 10/12/2019   GLUCOSE 238 (H) 10/12/2019   BUN 24 (H) 10/12/2019   CREATININE 0.68 10/12/2019   CALCIUM 8.6 (L) 10/12/2019   PROT 6.4 (L) 10/12/2019   ALBUMIN 3.3 (L) 10/12/2019   AST 20 10/12/2019   ALT 26 10/12/2019   ALKPHOS 103 10/12/2019   BILITOT 0.4 10/12/2019   GFRNONAA >60 10/12/2019   GFRAA >60 10/12/2019    Lab Results  Component Value Date   WBC 16.7 (H) 10/12/2019   NEUTROABS 13.2 (H) 10/12/2019   HGB 14.3 10/12/2019   HCT 41.3 10/12/2019   MCV 90.0 10/12/2019   PLT 250 10/12/2019     STUDIES: PERIPHERAL VASCULAR CATHETERIZATION  Result Date: 10/01/2019 See op note   ASSESSMENT: Stage IVb non-small cell lung cancer, NOS.  PLAN:    1. Stage IVb non-small cell lung cancer, NOS: Biopsy consistent with non-small cell carcinoma of lung.  There was insufficient tissue and too much necrosis for further mutational or PD-L1 testing. Liquid biopsy is pending at time of dictation.  PET scan results from September 07, 2019 reviewed  independently with right upper lobe lung lesion invading the mediastinum, small bilateral pulmonary nodules, and bilateral adrenal metastasis.  Patient also has cortical bone metastasis in left femur.  MRI of the brain was completed at [facility] and is located in care everywhere.  Patient has now completed XRT.  Since malignancy cannot be further specified other than non-small cell carcinoma, patient will receive carboplatinum and Taxol with Fulphilia support every 3 weeks for 4 cycles and then reimage.  Will consider changing treatment plan if liquid biopsy provides further information.  Patient tolerated cycle 1 without significant side effects.  Return to clinic in 2 weeks for further evaluation and consideration of cycle 2.  Patient will now receive her Fulphilia injections at home 24 to 48 hours after treatments. 2.  Facial numbness: Improving.  Secondary to brain metastasis.  XRT has been completed. 3.  Brain metastasis: XRT has been completed. 4.  Left femur lesion: XRT has been completed. 5.  Foot drop: Appreciate Occupational Therapy input. 6.  Hypotension: Patient is asymptomatic.  Continue to increase fluid intake. 7.  Leukocytosis: Possibly secondary to Fulphilia.  Monitor.  Patient expressed understanding and was in agreement with this plan. She also understands that She can call clinic at any time with any questions, concerns, or complaints.   Cancer Staging Non-small cell carcinoma of lung, right [facility]) Staging form: Lung, AJCC 8th Edition - Clinical stage from 09/26/2019: Stage IVB (cT4, cN3, cM1c) - Signed by Lloyd Huger, MD on 09/26/2019   Lloyd Huger, MD   10/12/2019 12:56 PM

## 2019-10-12 ENCOUNTER — Other Ambulatory Visit: Payer: Self-pay

## 2019-10-12 ENCOUNTER — Telehealth: Payer: Self-pay | Admitting: Adult Health

## 2019-10-12 ENCOUNTER — Inpatient Hospital Stay (HOSPITAL_BASED_OUTPATIENT_CLINIC_OR_DEPARTMENT_OTHER): Payer: Managed Care, Other (non HMO) | Admitting: Oncology

## 2019-10-12 ENCOUNTER — Inpatient Hospital Stay: Payer: Managed Care, Other (non HMO)

## 2019-10-12 ENCOUNTER — Encounter: Payer: Self-pay | Admitting: Oncology

## 2019-10-12 VITALS — BP 117/70 | HR 102 | Temp 98.7°F | Resp 20 | Wt 146.6 lb

## 2019-10-12 DIAGNOSIS — C7951 Secondary malignant neoplasm of bone: Secondary | ICD-10-CM | POA: Diagnosis not present

## 2019-10-12 DIAGNOSIS — C3491 Malignant neoplasm of unspecified part of right bronchus or lung: Secondary | ICD-10-CM | POA: Diagnosis not present

## 2019-10-12 DIAGNOSIS — C7972 Secondary malignant neoplasm of left adrenal gland: Secondary | ICD-10-CM | POA: Diagnosis not present

## 2019-10-12 DIAGNOSIS — C7931 Secondary malignant neoplasm of brain: Secondary | ICD-10-CM | POA: Insufficient documentation

## 2019-10-12 DIAGNOSIS — E876 Hypokalemia: Secondary | ICD-10-CM | POA: Insufficient documentation

## 2019-10-12 DIAGNOSIS — C781 Secondary malignant neoplasm of mediastinum: Secondary | ICD-10-CM | POA: Insufficient documentation

## 2019-10-12 DIAGNOSIS — C3411 Malignant neoplasm of upper lobe, right bronchus or lung: Secondary | ICD-10-CM | POA: Diagnosis present

## 2019-10-12 DIAGNOSIS — Z79899 Other long term (current) drug therapy: Secondary | ICD-10-CM | POA: Diagnosis not present

## 2019-10-12 DIAGNOSIS — Z5111 Encounter for antineoplastic chemotherapy: Secondary | ICD-10-CM | POA: Insufficient documentation

## 2019-10-12 DIAGNOSIS — M21372 Foot drop, left foot: Secondary | ICD-10-CM | POA: Diagnosis not present

## 2019-10-12 DIAGNOSIS — D72829 Elevated white blood cell count, unspecified: Secondary | ICD-10-CM | POA: Diagnosis not present

## 2019-10-12 DIAGNOSIS — C7971 Secondary malignant neoplasm of right adrenal gland: Secondary | ICD-10-CM | POA: Diagnosis not present

## 2019-10-12 LAB — COMPREHENSIVE METABOLIC PANEL
ALT: 26 U/L (ref 0–44)
AST: 20 U/L (ref 15–41)
Albumin: 3.3 g/dL — ABNORMAL LOW (ref 3.5–5.0)
Alkaline Phosphatase: 103 U/L (ref 38–126)
Anion gap: 12 (ref 5–15)
BUN: 24 mg/dL — ABNORMAL HIGH (ref 8–23)
CO2: 22 mmol/L (ref 22–32)
Calcium: 8.6 mg/dL — ABNORMAL LOW (ref 8.9–10.3)
Chloride: 103 mmol/L (ref 98–111)
Creatinine, Ser: 0.68 mg/dL (ref 0.44–1.00)
GFR calc Af Amer: >60 mL/min (ref >60)
GFR calc non Af Amer: >60 mL/min (ref >60)
Glucose, Bld: 238 mg/dL — ABNORMAL HIGH (ref 70–99)
Potassium: 4 mmol/L (ref 3.5–5.1)
Sodium: 137 mmol/L (ref 135–145)
Total Bilirubin: 0.4 mg/dL (ref 0.3–1.2)
Total Protein: 6.4 g/dL — ABNORMAL LOW (ref 6.5–8.1)

## 2019-10-12 LAB — CBC WITH DIFFERENTIAL/PLATELET
Abs Immature Granulocytes: 0.8 10*3/uL — ABNORMAL HIGH (ref 0.00–0.07)
Band Neutrophils: 20 %
Basophils Absolute: 0 10*3/uL (ref 0.0–0.1)
Basophils Relative: 0 %
Eosinophils Absolute: 0 10*3/uL (ref 0.0–0.5)
Eosinophils Relative: 0 %
HCT: 41.3 % (ref 36.0–46.0)
Hemoglobin: 14.3 g/dL (ref 12.0–15.0)
Lymphocytes Relative: 4 %
Lymphs Abs: 0.7 10*3/uL (ref 0.7–4.0)
MCH: 31.2 pg (ref 26.0–34.0)
MCHC: 34.6 g/dL (ref 30.0–36.0)
MCV: 90 fL (ref 80.0–100.0)
Metamyelocytes Relative: 3 %
Monocytes Absolute: 2 10*3/uL — ABNORMAL HIGH (ref 0.1–1.0)
Monocytes Relative: 12 %
Myelocytes: 2 %
Neutro Abs: 13.2 10*3/uL — ABNORMAL HIGH (ref 1.7–7.7)
Neutrophils Relative %: 59 %
Platelets: 250 10*3/uL (ref 150–400)
RBC: 4.59 MIL/uL (ref 3.87–5.11)
RDW: 14.4 % (ref 11.5–15.5)
Smear Review: NORMAL
WBC Morphology: INCREASED
WBC: 16.7 10*3/uL — ABNORMAL HIGH (ref 4.0–10.5)
nRBC: 0 % (ref 0.0–0.2)

## 2019-10-12 NOTE — Progress Notes (Signed)
Patient here today for follow up. States she is still having weakness in legs and they ache at night. She would like to discuss alternative  Medications such a tylenol to help with aches in legs. She also would like to discuss if she qualifies for booster shot covid vaccine. She states she has been evaluated by PT and has not heard anything further. Would like to know if she qualifies for physical therapy.

## 2019-10-12 NOTE — Telephone Encounter (Signed)
Called and spoke to patient.  Due to mobility issues, she is requesting a phone visit for 10/13/2019 visit vs face to face.   Tammy, please advise. Thanks

## 2019-10-13 ENCOUNTER — Encounter: Payer: Self-pay | Admitting: Adult Health

## 2019-10-13 ENCOUNTER — Ambulatory Visit (INDEPENDENT_AMBULATORY_CARE_PROVIDER_SITE_OTHER): Payer: Managed Care, Other (non HMO) | Admitting: Adult Health

## 2019-10-13 DIAGNOSIS — R918 Other nonspecific abnormal finding of lung field: Secondary | ICD-10-CM | POA: Diagnosis not present

## 2019-10-13 NOTE — Patient Instructions (Signed)
Continue follow-up with oncology. Continue on current regimen. Continue to work on quitting smoking Follow-up with pulmonary as needed

## 2019-10-13 NOTE — Progress Notes (Signed)
Virtual Visit via Telephone Note  I connected with Sandra Leonard on 10/13/19 at  9:30 AM EDT by telephone and verified that I am speaking with the correct person using two identifiers.  Location: Patient: Home Provider: Office   I discussed the limitations, risks, security and privacy concerns of performing an evaluation and management service by telephone and the availability of in person appointments. I also discussed with the patient that there may be a patient responsible charge related to this service. The patient expressed understanding and agreed to proceed.   History of Present Illness: 64 year old female active smoker followed for stage IVb non-small cell lung cancer, undergoing concurrent chemo and radiation.  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 Brain mets status post Decadron and XRT.  Left femur lesion status post XRT   Today's televisit is a followup .  Patient was seen next month for a right upper lobe lung mass and underwent navigational bronchoscopy with EBUS.  Failed to have non-small cell metastatic lung cancer.  As above she is following with oncology.  She has metastatic disease to the brain and left femur.  She is status post Decadron and XRT to the brain and XRT to the left femur lesion.  She is undergoing chemo.  She had this 1 week ago and is going every 3 weeks.  She has finished her radiation treatments last week.  She says amazingly she is doing well and has had no known side effects except feels a little fatigued.  She is cutting back on her smoking.  She denies any previous diagnosis of asthma or COPD.  She says that she is breathing well has no increased cough or congestion.  She is not on any maintenance inhalers  Past Medical History:  Diagnosis Date  . Adrenal gland anomaly    bilateral lesion  . Brain lesion    x3  . Diabetes ([place])   . Diabetes mellitus without complication ([place])   . GERD (gastroesophageal reflux disease)   . Heartburn   . Lesion of  left femur   . Lung cancer [facility])    Current Outpatient Medications on File Prior to Visit  Medication Sig Dispense Refill  . atorvastatin (LIPITOR) 10 MG tablet Take 10 mg by mouth daily.    Marland Kitchen dexamethasone (DECADRON) 4 MG tablet Take 1 tablet (4 mg total) by mouth in the morning and at bedtime. 60 tablet 0  . diphenhydrAMINE (BENADRYL ALLERGY) 25 MG tablet Take 25 mg by mouth at bedtime as needed for sleep.     . empagliflozin (JARDIANCE) 10 MG TABS tablet Take 10 mg by mouth at bedtime.     Marland Kitchen esomeprazole (NEXIUM) 40 MG capsule Take 40 mg by mouth daily.    Marland Kitchen HYDROcodone-acetaminophen (NORCO) 5-325 MG tablet Take 1 tablet by mouth every 6 (six) hours as needed for moderate pain. 30 tablet 0  . lidocaine-prilocaine (EMLA) cream Apply to affected area once 30 g 3  . magic mouthwash w/lidocaine SOLN Take 5 mLs by mouth 4 (four) times daily. 240 mL 0  . metFORMIN (GLUCOPHAGE) 1000 MG tablet Take 1 tablet (1,000 mg total) by mouth 2 (two) times daily with a meal. 180 tablet 3  . nicotine (NICODERM CQ - DOSED IN MG/24 HOURS) 21 mg/24hr patch Place onto the skin.    . nicotine (NICOTROL) 10 MG inhaler Inhale 1 Cartridge (1 continuous puffing total) into the lungs as needed for smoking cessation. 42 each 0  . omeprazole (PRILOSEC) 20 MG  capsule TAKE 1 CAPSULE (20 MG TOTAL) BY MOUTH DAILY. 30 capsule 6  . ondansetron (ZOFRAN) 8 MG tablet Take 1 tablet (8 mg total) by mouth 2 (two) times daily as needed for refractory nausea / vomiting. 60 tablet 2  . ONE TOUCH ULTRA TEST test strip TEST BLOOD SUGAR IN THE MORNING AND 2 HOURS AFTER LARGEST MEAL 50 each 2  . prochlorperazine (COMPAZINE) 10 MG tablet Take 1 tablet (10 mg total) by mouth every 6 (six) hours as needed (Nausea or vomiting). 60 tablet 2  . sertraline (ZOLOFT) 50 MG tablet Take half a tablet (25mg ) by mouth daily x 1 week and then increase to one tablet (50mg ) daily (Patient taking differently: Take 50 mg by mouth daily. ) 30 tablet 2  .  traZODone (DESYREL) 100 MG tablet Take 1-2 tablets (100-200 mg total) by mouth at bedtime as needed for sleep. 60 tablet 2  . lisinopril (ZESTRIL) 5 MG tablet Take 5 mg by mouth daily.  (Patient not taking: Reported on 10/13/2019)     No current facility-administered medications on file prior to visit.     Observations/Objective: Speaks in full sentences with no audible distress  Assessment and Plan: Metastatic lung cancer-continue with planned follow-up with oncology and current treatment plan.  Active smoker.  Smoking cessation discussed.  Plan  Patient Instructions  Continue follow-up with oncology. Continue on current regimen. Continue to work on quitting smoking Follow-up with pulmonary as needed      Follow Up Instructions: Follow-up with pulmonary as needed   I discussed the assessment and treatment plan with the patient. The patient was provided an opportunity to ask questions and all were answered. The patient agreed with the plan and demonstrated an understanding of the instructions.   The patient was advised to call back or seek an in-person evaluation if the symptoms worsen or if the condition fails to improve as anticipated.  I provided 22 minutes of non-face-to-face time during this encounter.   Rexene Edison, NP

## 2019-10-13 NOTE — Telephone Encounter (Signed)
Per TP verbally- okay to do phone visit.  appt has been changed to phone visit.  Nothing further is needed.

## 2019-10-14 ENCOUNTER — Telehealth: Payer: Self-pay

## 2019-10-14 ENCOUNTER — Telehealth: Payer: Self-pay | Admitting: *Deleted

## 2019-10-14 NOTE — Telephone Encounter (Signed)
NCM Cindy with [employer] called for patient stating that patient was to have occupational therapy or PT referral, but she has not heard anything from anyone about it. She reports that patient has fallen several times in the home due to her weakness and footdrop. She is asking if we could facilitate getting this process going

## 2019-10-14 NOTE — Telephone Encounter (Signed)
Yes we can 

## 2019-10-14 NOTE — Telephone Encounter (Signed)
The referral was sent on 8/4 by Tillie Rung to [facility] rehab. From the looks of it, it doesn't appear that anyone from their side has done anything with it.

## 2019-10-14 NOTE — Telephone Encounter (Signed)
Won't this need to go through a home health agency for her to get it at home?

## 2019-10-16 ENCOUNTER — Other Ambulatory Visit: Payer: Self-pay | Admitting: *Deleted

## 2019-10-16 NOTE — Telephone Encounter (Signed)
Per NCM, she has transportation issues and wants home PT

## 2019-10-16 NOTE — Telephone Encounter (Signed)
I tried to reach out to patient and didn't get an answer but will follow up, looks like she is scheduled for PT Eval in [place] on 8/23.

## 2019-10-16 NOTE — Telephone Encounter (Signed)
I sent the referral per what Sandra Leonard asked me to do, it was her understanding patient wanted to have PT at [facility] unless that has changed.

## 2019-10-19 ENCOUNTER — Ambulatory Visit: Payer: Managed Care, Other (non HMO) | Attending: Oncology | Admitting: Physical Therapy

## 2019-10-19 ENCOUNTER — Other Ambulatory Visit: Payer: Self-pay

## 2019-10-19 DIAGNOSIS — R5383 Other fatigue: Secondary | ICD-10-CM | POA: Diagnosis present

## 2019-10-19 DIAGNOSIS — R2689 Other abnormalities of gait and mobility: Secondary | ICD-10-CM | POA: Diagnosis present

## 2019-10-19 DIAGNOSIS — R269 Unspecified abnormalities of gait and mobility: Secondary | ICD-10-CM

## 2019-10-19 DIAGNOSIS — M6281 Muscle weakness (generalized): Secondary | ICD-10-CM | POA: Diagnosis present

## 2019-10-19 NOTE — Therapy (Signed)
[facility] [address] [place], [place], [address] Phone: [phone]   Fax:  [phone]  Physical Therapy Evaluation  Patient Details  Name: Sandra Leonard MRN: [id number] Date of Birth: December 10, 1955 Referring Provider (PT): Dr. Delight Hoh   Encounter Date: 10/19/2019   PT End of Session - 10/19/19 1357    Visit Number 1    Number of Visits 9    Date for PT Re-Evaluation 11/16/19    Authorization - Visit Number 1    Authorization - Number of Visits 10    PT Start Time 6433    PT Stop Time 1435    PT Time Calculation (min) 52 min    Equipment Utilized During Treatment Gait belt    Activity Tolerance Patient tolerated treatment well    Behavior During Therapy [facility] for tasks assessed/performed           Past Medical History:  Diagnosis Date  . Adrenal gland anomaly    bilateral lesion  . Brain lesion    x3  . Diabetes ([place])   . Diabetes mellitus without complication ([place])   . GERD (gastroesophageal reflux disease)   . Heartburn   . Lesion of left femur   . Lung cancer [facility])     Past Surgical History:  Procedure Laterality Date  . ABDOMINAL HYSTERECTOMY    . CESAREAN SECTION     x2  . PORTA CATH INSERTION N/A 10/01/2019   Procedure: PORTA CATH INSERTION;  Surgeon: Katha Cabal, MD;  Location: [place] CV [facility];  Service: Cardiovascular;  Laterality: N/A;  . TONSILLECTOMY    . VIDEO BRONCHOSCOPY WITH ENDOBRONCHIAL ULTRASOUND N/A 09/21/2019   Procedure: VIDEO BRONCHOSCOPY WITH ENDOBRONCHIAL ULTRASOUND;  Surgeon: Tyler Pita, MD;  Location: [facility] ORS;  Service: Pulmonary;  Laterality: N/A;  FLURO ON STANDBY    There were no vitals filed for this visit.    Subjective Assessment - 10/19/19 1346    Subjective Pt. states that this past spring she was diagnosed with stage 4 lung cancer with spots on the femur and in the brain. Pt. has received radiation tx, and has gone through first round of chemo. Pt  will start next round of chemo next Monday. Pt states that she has had lower extremity pains for years prior with sciatic pain. Pt. states that the weakness in her left leg makes stairs difficult, walking difficult, and getting in/out bed difficult. Pt. reports that a ramp has been built for her stairs to get into the house. Pt. reports no pain in the leg currently with the exception of aching at night. Pt. states that she can walk for about 5 mins before feeling fatigue. Pt. reports drop foot having started on the R foot about 4-6 weeks ago. Pt has a foot up device that she ordered on [place] that helps her walking.    Patient is accompained by: Family member    Patient Stated Goals wants to be able walk and garden again    Currently in Pain? No/denies             [facility] PT Assessment - 10/20/19 0001      Assessment   Medical Diagnosis Muscle weakness due to Chemo    Referring Provider (PT) Dr. Delight Hoh    Onset Date/Surgical Date 02/27/19      Precautions   Precautions Fall      Balance Screen   Has the patient fallen in the past 6  months Yes      Prior Function   Level of Independence Independent           OBJECTIVE   Mental Status Patient is oriented to person, place and time.  Recent memory is intact.  Attention span and concentration are intact.  Expressive speech is intact.  Patient's fund of knowledge is within normal limits for educational level.  Strength (R/L) 3+/3+ Hip flexion 5/5 Hip abduction (seated) 4+/4+ Hip adduction (seated) 4/3+ Knee extension 4+/4 Knee flexion 4/1+ Ankle dorsiflexion *Indicates pain   Gait Pt demonstrates excessive hip flexion on R to compensate for lack of R ankle dorsiflex. Pt had decreased BOS, decreased stride length bilat, and overall cautious gait pattern.   Special Tests Berg Balance: 50/56 DGI: 15/24 FOTO: 40/55       PT Long Term Goals - 10/20/19 0835      PT LONG TERM GOAL #1   Title Pt will improve  FOTO to 55 to improve pain free functional mobility.    Baseline 8/23: 40/55    Time 4    Period Weeks    Status New    Target Date 11/16/19      PT LONG TERM GOAL #2   Title Pt will improve DGI to 22/24 with LRAD to decrease falls risk.    Baseline 8/23: 15/24    Time 4    Period Weeks    Status New    Target Date 11/16/19      PT LONG TERM GOAL #3   Title Pt. will improve strength by half a muscle grade in bilat LE to improve functional mobility.    Baseline 8/23: R/L (out of 5) 3+/3+ Hip flexion, 5/5 Hip abduction (seated), 4+/4+ Hip adduction (seated), 4/3+ Knee extension, 4+/4 Knee flexion, 4/1+ Ankle dorsiflexion    Time 4    Period Weeks    Status New    Target Date 11/16/19      PT LONG TERM GOAL #4   Title Pt. will be able to come to standing from floor independently to improve fall recovery.    Baseline 8/23: Pt unable to get up off floor independently at this time    Time 4    Period Weeks    Status New    Target Date 11/16/19              Plan - 10/20/19 0843    Clinical Impression Statement Pt. is a 64 y.o. female with stage 4 lung cancer that has spread to her brain and L femur. Pt. has received radiation and chemo, and will receive a second round of chemo next week. Pt. demonstrates following strength: R/L (out of 5) 3+/3+ Hip flexion, 5/5 Hip abduction (seated), 4+/4+ Hip adduction (seated), 4/3+ Knee extension, 4+/4 Knee flexion, 4/1+ Ankle dorsiflexion. Pt. scored 50/56 on the Berg balance scale, 15/24 on the DGI, and 40/55 on FOTO. Pt. demonstrates following gait abnormalities: excessive hip flexion on R to compensate for lack of R ankle dorsiflex, decreased BOS, decreased stride length bilat, and overall cautious gait pattern. Pt. has reported multiple falls in the past few weeks during transfers or walking. Pt. will benefit from skilled PT to improve balance, strength, and to improve overall functional mobility in order to improve independent functional  mobility and decrease falls risk.    Personal Factors and Comorbidities Comorbidity 2    Comorbidities stage 4 lung cancer spread to brain and L femur, diabetes    Examination-Activity Limitations Stairs;Locomotion  Level    Stability/Clinical Decision Making Unstable/Unpredictable    Clinical Decision Making High    Rehab Potential Fair    PT Frequency 2x / week    PT Duration 4 weeks    PT Treatment/Interventions ADLs/Self Care Home Management;Cryotherapy;Electrical Stimulation;Neuromuscular re-education;Balance training;Therapeutic exercise;Therapeutic activities;Functional mobility training;Gait training;Stair training;Patient/family education    PT Next Visit Plan balance, floor transfers, gait training    Consulted and Agree with Plan of Care Patient;Family member/caregiver    Family Member Consulted sister           Patient will benefit from skilled therapeutic intervention in order to improve the following deficits and impairments:  Abnormal gait, Decreased activity tolerance, Decreased balance, Decreased cognition, Decreased mobility, Decreased endurance, Decreased coordination, Decreased strength, Difficulty walking  Visit Diagnosis: Muscle weakness (generalized)  Imbalance  Gait difficulty  Fatigue, unspecified type     Problem List Patient Active Problem List   Diagnosis Date Noted  . Non-small cell carcinoma of lung, right ([place]) 09/26/2019  . Goals of care, counseling/discussion 09/26/2019  . Facial paresthesia 07/29/2019  . Overweight 05/05/2019  . Colon polyp 07/11/2017  . Hyperlipidemia, mixed 07/11/2017  . GERD (gastroesophageal reflux disease) 11/20/2016  . Tobacco use 11/20/2016  . Type 2 diabetes mellitus not at goal [facility]) 01/30/2016   Pura Spice, PT, DPT # [id number], SPT 10/20/2019, 4:21 PM  [facility] [address] [place], [place], [address] Phone: [phone]   Fax:   [phone]  Name: Sandra Leonard MRN: [id number] Date of Birth: 1955/07/03

## 2019-10-20 ENCOUNTER — Other Ambulatory Visit: Payer: Self-pay | Admitting: Oncology

## 2019-10-20 ENCOUNTER — Ambulatory Visit
Admission: RE | Admit: 2019-10-20 | Discharge: 2019-10-20 | Disposition: A | Discharge status: Home / Self Care | Payer: Self-pay | Source: Ambulatory Visit | Attending: Oncology | Admitting: Oncology

## 2019-10-20 ENCOUNTER — Encounter: Payer: Self-pay | Admitting: Physical Therapy

## 2019-10-20 DIAGNOSIS — C3491 Malignant neoplasm of unspecified part of right bronchus or lung: Secondary | ICD-10-CM

## 2019-10-22 ENCOUNTER — Telehealth: Payer: Managed Care, Other (non HMO) | Admitting: Hospice and Palliative Medicine

## 2019-10-22 ENCOUNTER — Encounter: Payer: Self-pay | Admitting: Physical Therapy

## 2019-10-22 ENCOUNTER — Other Ambulatory Visit: Payer: Self-pay

## 2019-10-22 ENCOUNTER — Ambulatory Visit: Payer: Managed Care, Other (non HMO) | Admitting: Physical Therapy

## 2019-10-22 VITALS — BP 128/66

## 2019-10-22 DIAGNOSIS — M6281 Muscle weakness (generalized): Secondary | ICD-10-CM

## 2019-10-22 DIAGNOSIS — R269 Unspecified abnormalities of gait and mobility: Secondary | ICD-10-CM

## 2019-10-22 DIAGNOSIS — R2689 Other abnormalities of gait and mobility: Secondary | ICD-10-CM

## 2019-10-22 DIAGNOSIS — R5383 Other fatigue: Secondary | ICD-10-CM

## 2019-10-22 NOTE — Therapy (Signed)
[facility] [address] [place], [place], [address] Phone: [phone]   Fax:  [phone]  Physical Therapy Treatment  Patient Details  Name: MERIS REEDE MRN: [id number] Date of Birth: Oct 15, 1955 Referring Provider (PT): Dr. Delight Hoh   Encounter Date: 10/22/2019   PT End of Session - 10/22/19 1028    Visit Number 2    Number of Visits 9    Date for PT Re-Evaluation 11/16/19    Authorization - Visit Number 2    Authorization - Number of Visits 10    PT Start Time 1030    PT Stop Time 1115    PT Time Calculation (min) 45 min    Equipment Utilized During Treatment Gait belt    Activity Tolerance Patient tolerated treatment well    Behavior During Therapy [facility] for tasks assessed/performed           Past Medical History:  Diagnosis Date  . Adrenal gland anomaly    bilateral lesion  . Brain lesion    x3  . Diabetes ([place])   . Diabetes mellitus without complication ([place])   . GERD (gastroesophageal reflux disease)   . Heartburn   . Lesion of left femur   . Lung cancer [facility])     Past Surgical History:  Procedure Laterality Date  . ABDOMINAL HYSTERECTOMY    . CESAREAN SECTION     x2  . PORTA CATH INSERTION N/A 10/01/2019   Procedure: PORTA CATH INSERTION;  Surgeon: Katha Cabal, MD;  Location: [place] CV LAB;  Service: Cardiovascular;  Laterality: N/A;  . TONSILLECTOMY    . VIDEO BRONCHOSCOPY WITH ENDOBRONCHIAL ULTRASOUND N/A 09/21/2019   Procedure: VIDEO BRONCHOSCOPY WITH ENDOBRONCHIAL ULTRASOUND;  Surgeon: Tyler Pita, MD;  Location: [facility] ORS;  Service: Pulmonary;  Laterality: N/A;  FLURO ON STANDBY    Vitals:   10/22/19 1041  BP: 128/66     Subjective Assessment - 10/22/19 1031    Subjective Pt. states that she has been walking more and has been improving her STS technique to only needing one hand to come to standing. No pain, no falls, no near falls.    Patient is accompained by: Family  member    Patient Stated Goals wants to be able walk and garden again    Currently in Pain? No/denies          There Ex:  Seated R levator scap stretch: 3x30s each  Seated marches against 1.5 lbs: 2x8 reps each leg. PT assist higher ranges on L hip flex and instructed pt to engage eccentric action of hip flexor.  Education provided of benefits of AFO for preventing falls due to R foot drop during seated rest break.   STS with arms across chest: x1 in standard chair. Unable to perform.  STS with arms across chest from elevated blue mat table: x3. Height decreased ~2-3", x3. Able to stand from 19". Pt still unable to stand from regular chair without hands.  Gait:  Assessed foot drop and gait with quad cane and single point cane: PT adjusted height of quad cane and orientation to being used in R hand. Pt instructed to correctly ambulate with cane, able to do so. PT reassessed foot up device on R foot, tightened mechanism to hold foot in more dorsiflex. Pt continues to ambulate with increased hip flex on the R to attain proper foot clearance.       PT Long Term Goals - 10/22/19 1050  PT LONG TERM GOAL #1   Title Pt will improve FOTO to 55 to improve pain free functional mobility.    Baseline 8/23: 40/55    Time 4    Period Weeks    Status New    Target Date 11/16/19      PT LONG TERM GOAL #2   Title Pt will improve DGI to 22/24 with LRAD to decrease falls risk.    Baseline 8/23: 15/24    Time 4    Period Weeks    Status New    Target Date 11/16/19      PT LONG TERM GOAL #3   Title Pt. will improve strength by half a muscle grade in bilat LE to improve functional mobility.    Baseline 8/23: R/L (out of 5) 3+/3+ Hip flexion, 5/5 Hip abduction (seated), 4+/4+ Hip adduction (seated), 4/3+ Knee extension, 4+/4 Knee flexion, 4/0 Ankle dorsiflexion    Time 4    Period Weeks    Status New    Target Date 11/16/19      PT LONG TERM GOAL #4   Title Pt. will be able to come to  standing from floor independently to improve fall recovery.    Baseline 8/23: Pt unable to get up off floor independently at this time    Time 4    Period Weeks    Status New    Target Date 11/16/19               Plan - 10/22/19 1130    Clinical Impression Statement Pt. focused on LE strength and device training today. Pt's R ankle strength assessed, PT felt no contraction of dorsiflexion or eversion at this timein ankle. PT began discussion about AFO usage for R foot. Pt. practiced STS from various heights without use of UE, still unable to without use of hands from standard chair, but can stand from 19" blue mat without UE use. Pt. taken through AD training and PT adjusted quad cane for pt. Pt. ambulated without much difference with and without the cane. Pt. will continue to benefit from skilled PT to improve balance and strength to decrease falls risk.    Personal Factors and Comorbidities Comorbidity 2    Comorbidities stage 4 lung cancer spread to brain and L femur, diabetes    Examination-Activity Limitations Stairs;Locomotion Level    Stability/Clinical Decision Making Unstable/Unpredictable    Clinical Decision Making High    Rehab Potential Fair    PT Frequency 2x / week    PT Duration 4 weeks    PT Treatment/Interventions ADLs/Self Care Home Management;Cryotherapy;Electrical Stimulation;Neuromuscular re-education;Balance training;Therapeutic exercise;Therapeutic activities;Functional mobility training;Gait training;Stair training;Patient/family education    PT Next Visit Plan balance, floor transfers, gait training    Consulted and Agree with Plan of Care Patient;Family member/caregiver    Family Member Consulted sister           Patient will benefit from skilled therapeutic intervention in order to improve the following deficits and impairments:  Abnormal gait, Decreased activity tolerance, Decreased balance, Decreased cognition, Decreased mobility, Decreased endurance,  Decreased coordination, Decreased strength, Difficulty walking  Visit Diagnosis: Muscle weakness (generalized)  Gait difficulty  Fatigue, unspecified type  Imbalance     Problem List Patient Active Problem List   Diagnosis Date Noted  . Non-small cell carcinoma of lung, right ([place]) 09/26/2019  . Goals of care, counseling/discussion 09/26/2019  . Facial paresthesia 07/29/2019  . Overweight 05/05/2019  . Colon polyp 07/11/2017  . Hyperlipidemia, mixed  07/11/2017  . GERD (gastroesophageal reflux disease) 11/20/2016  . Tobacco use 11/20/2016  . Type 2 diabetes mellitus not at goal [facility]) 01/30/2016   Pura Spice, PT, DPT # [id number], SPT 10/22/2019, 2:02 PM  [facility] [address] [place], [place], [address] Phone: [phone]   Fax:  [phone]  Name: KONSTANTINA NACHREINER MRN: [id number] Date of Birth: 25-Sep-1955

## 2019-10-24 NOTE — Progress Notes (Addendum)
[place]  [phone] [phone]  ID: Sandra Leonard OB: 1955-12-27  MR#: [id number]  MWN#:[id number]  Patient Care Team: Barbaraann Boys, MD as PCP - General (Pediatrics) Telford Nab, RN as Oncology Nurse Navigator Grayland Ormond, Kathlene November, MD as Consulting Physician (Oncology)  I connected with Sandra Leonard on 10/26/19 at  8:30 AM EDT by video enabled telemedicine visit and verified that I am speaking with the correct person using two identifiers.   I discussed the limitations, risks, security and privacy concerns of performing an evaluation and management service by telemedicine and the availability of in-person appointments. I also discussed with the patient that there may be a patient responsible charge related to this service. The patient expressed understanding and agreed to proceed.   Other persons participating in the visit and their role in the encounter: Patient, MD.  Patient's location: Clinic. Provider's location: Home.  CHIEF COMPLAINT: Stage IVb non-small cell lung cancer, NOS.  INTERVAL HISTORY: Patient agreed to video assisted telemedicine visit for further evaluation and consideration of cycle 2 of carboplatin and Taxol.  She continues to have weakness and fatigue, but this has improved.  She continues to have left foot drop which is helped by her brace.  She otherwise feels well.  She has no other neurologic complaints.  She denies any recent fevers or illnesses.  She has a good appetite and denies weight loss.  She has no chest pain, shortness of breath, cough, or hemoptysis.  She denies any nausea, vomiting, constipation, or diarrhea.  She has no melena or hematochezia.  She has no urinary complaints.  Patient offers no further specific complaints today.  REVIEW OF SYSTEMS:   Review of Systems  Constitutional: Negative.  Negative for fever, malaise/fatigue and weight loss.  Respiratory: Negative.  Negative for cough, hemoptysis and  shortness of breath.   Cardiovascular: Negative.  Negative for chest pain and leg swelling.  Gastrointestinal: Negative.  Negative for abdominal pain.  Genitourinary: Negative.  Negative for dysuria.  Musculoskeletal: Negative.  Negative for back pain.  Skin: Negative.  Negative for rash.  Neurological: Positive for focal weakness. Negative for dizziness, tingling, speech change, weakness and headaches.  Psychiatric/Behavioral: Negative.  The patient is not nervous/anxious.     As per HPI. Otherwise, a complete review of systems is negative.  PAST MEDICAL HISTORY: Past Medical History:  Diagnosis Date  . Adrenal gland anomaly    bilateral lesion  . Brain lesion    x3  . Diabetes ([place])   . Diabetes mellitus without complication ([place])   . GERD (gastroesophageal reflux disease)   . Heartburn   . Lesion of left femur   . Lung cancer ([place])     PAST SURGICAL HISTORY: Past Surgical History:  Procedure Laterality Date  . ABDOMINAL HYSTERECTOMY    . CESAREAN SECTION     x2  . PORTA CATH INSERTION N/A 10/01/2019   Procedure: PORTA CATH INSERTION;  Surgeon: Katha Cabal, MD;  Location: [facility];  Service: Cardiovascular;  Laterality: N/A;  . TONSILLECTOMY    . VIDEO BRONCHOSCOPY WITH ENDOBRONCHIAL ULTRASOUND N/A 09/21/2019   Procedure: VIDEO BRONCHOSCOPY WITH ENDOBRONCHIAL ULTRASOUND;  Surgeon: Tyler Pita, MD;  Location: [facility];  Service: Pulmonary;  Laterality: N/A;  FLURO ON STANDBY    FAMILY HISTORY: Family History  Problem Relation Age of Onset  . Breast cancer Mother 23  . Diabetes Mother   . Hypertension Mother   . Hypertension Father   .  Diabetes Sister   . Hypertension Sister   . Diabetes Brother     ADVANCED DIRECTIVES (Y/N):  N  HEALTH MAINTENANCE: Social History   Tobacco Use  . Smoking status: Current Every Day Smoker    Packs/day: 1.00    Years: 40.00    Pack years: 40.00  . Smokeless tobacco: Never Used  . Tobacco comment: 7-8  cigarettes a day--10/13/2019  Vaping Use  . Vaping Use: Never used  Substance Use Topics  . Alcohol use: No    Alcohol/week: 0.0 standard drinks  . Drug use: No     Colonoscopy:  PAP:  Bone density:  Lipid panel:  Allergies  Allergen Reactions  . Codeine     "makes her crazy"  . Penicillins Rash  . Sulfa Antibiotics Rash    Current Outpatient Medications  Medication Sig Dispense Refill  . atorvastatin (LIPITOR) 10 MG tablet Take 10 mg by mouth daily.    Marland Kitchen dexamethasone (DECADRON) 4 MG tablet Take 1 tablet (4 mg total) by mouth in the morning and at bedtime. 60 tablet 0  . diphenhydrAMINE (BENADRYL ALLERGY) 25 MG tablet Take 25 mg by mouth at bedtime as needed for sleep.     . empagliflozin (JARDIANCE) 10 MG TABS tablet Take 10 mg by mouth at bedtime.     Marland Kitchen esomeprazole (NEXIUM) 40 MG capsule Take 40 mg by mouth daily.    Marland Kitchen HYDROcodone-acetaminophen (NORCO) 5-325 MG tablet Take 1 tablet by mouth every 6 (six) hours as needed for moderate pain. 30 tablet 0  . lidocaine-prilocaine (EMLA) cream Apply to affected area once 30 g 3  . lisinopril (ZESTRIL) 5 MG tablet Take 5 mg by mouth daily.     . magic mouthwash w/lidocaine SOLN Take 5 mLs by mouth 4 (four) times daily. 240 mL 0  . metFORMIN (GLUCOPHAGE) 1000 MG tablet Take 1 tablet (1,000 mg total) by mouth 2 (two) times daily with a meal. 180 tablet 3  . nicotine (NICODERM CQ - DOSED IN MG/24 HOURS) 21 mg/24hr patch Place onto the skin.    . nicotine (NICOTROL) 10 MG inhaler Inhale 1 Cartridge (1 continuous puffing total) into the lungs as needed for smoking cessation. 42 each 0  . omeprazole (PRILOSEC) 20 MG capsule TAKE 1 CAPSULE (20 MG TOTAL) BY MOUTH DAILY. 30 capsule 6  . ondansetron (ZOFRAN) 8 MG tablet Take 1 tablet (8 mg total) by mouth 2 (two) times daily as needed for refractory nausea / vomiting. 60 tablet 2  . ONE TOUCH ULTRA TEST test strip TEST BLOOD SUGAR IN THE MORNING AND 2 HOURS AFTER LARGEST MEAL 50 each 2  .  prochlorperazine (COMPAZINE) 10 MG tablet Take 1 tablet (10 mg total) by mouth every 6 (six) hours as needed (Nausea or vomiting). 60 tablet 2  . sertraline (ZOLOFT) 50 MG tablet Take half a tablet (54m) by mouth daily x 1 week and then increase to one tablet (557m daily (Patient taking differently: Take 50 mg by mouth daily. ) 30 tablet 2  . traZODone (DESYREL) 100 MG tablet Take 1-2 tablets (100-200 mg total) by mouth at bedtime as needed for sleep. 60 tablet 2   No current facility-administered medications for this visit.   Facility-Administered Medications Ordered in Other Visits  Medication Dose Route Frequency Provider Last Rate Last Admin  . CARBOplatin (PARAPLATIN) 520 mg in sodium chloride 0.9 % 250 mL chemo infusion  520 mg Intravenous Once FiLloyd HugerMD      .  heparin lock flush 100 unit/mL  500 Units Intracatheter Once PRN Lloyd Huger, MD      . PACLitaxel (TAXOL) 390 mg in sodium chloride 0.9 % 500 mL chemo infusion (> 17m/m2)  225 mg/m2 (Treatment Plan Recorded) Intravenous Once FLloyd Huger MD        OBJECTIVE: Vitals:   10/26/19 0848  BP: 121/62  Pulse: (!) 101  Temp: (!) 96 F (35.6 C)  SpO2: 98%     Body mass index is 26.58 kg/m.    ECOG FS:0 - Asymptomatic  General: Well-developed, well-nourished, no acute distress. HEENT: Normocephalic. Neuro: Alert, answering all questions appropriately. Cranial nerves grossly intact. Psych: Normal affect.  LAB RESULTS:  Lab Results  Component Value Date   NA 136 10/26/2019   K 3.1 (L) 10/26/2019   CL 101 10/26/2019   CO2 24 10/26/2019   GLUCOSE 159 (H) 10/26/2019   BUN 15 10/26/2019   CREATININE 0.37 (L) 10/26/2019   CALCIUM 8.3 (L) 10/26/2019   PROT 6.4 (L) 10/26/2019   ALBUMIN 3.4 (L) 10/26/2019   AST 13 (L) 10/26/2019   ALT 15 10/26/2019   ALKPHOS 97 10/26/2019   BILITOT 0.6 10/26/2019   GFRNONAA >60 10/26/2019   GFRAA >60 10/26/2019    Lab Results  Component Value Date   WBC  22.8 (H) 10/26/2019   NEUTROABS 18.0 (H) 10/26/2019   HGB 13.4 10/26/2019   HCT 38.9 10/26/2019   MCV 90.0 10/26/2019   PLT 433 (H) 10/26/2019     STUDIES: PERIPHERAL VASCULAR CATHETERIZATION  Result Date: 10/01/2019 See op note   ASSESSMENT: Stage IVb non-small cell lung cancer, NOS.  PLAN:    1. Stage IVb non-small cell lung cancer, NOS: Biopsy consistent with non-small cell carcinoma of lung.  There was insufficient tissue and too much necrosis for further mutational or PD-L1 testing. Liquid biopsy is pending at time of dictation.  PET scan results from September 07, 2019 reviewed independently with right upper lobe lung lesion invading the mediastinum, small bilateral pulmonary nodules, and bilateral adrenal metastasis.  Patient also has cortical bone metastasis in left femur.  [facility]  Patient has now completed XRT.  Since malignancy cannot be further specified other than non-small cell carcinoma, patient will receive carboplatinum and Taxol with Fulphilia support every 3 weeks for 4 cycles and then reimage.  Will consider changing treatment plan if liquid biopsy provides further information.  Proceed with cycle 2 of carboplatinum and Taxol today.  Patient does her Fulphilia shots at home 24 to 48 hours after treatment.  Return to clinic in 3 weeks for further evaluation and consideration of cycle 3.   2.  Facial numbness: Improving.  Secondary to brain metastasis.  XRT has been completed. 3.  Brain metastasis: XRT has been completed. 4.  Left femur lesion: XRT has been completed. 5.  Foot drop: Appreciate Occupational Therapy input.  Continue brace as prescribed. 6.  Hypotension: Resolved. 7.  Leukocytosis: Possibly secondary to Fulphilia.  Monitor. 8.  Hypokalemia: Patient agreed to increase her dietary potassium.  Monitor.  I provided 30 minutes of face-to-face video visit time during this encounter which included chart review, counseling, and  coordination of care as documented above.   Patient expressed understanding and was in agreement with this plan. She also understands that She can call clinic at any time with any questions, concerns, or complaints.   Cancer Staging Non-small cell carcinoma of lung, right ([place] Staging form: Lung,  AJCC 8th Edition - Clinical stage from 09/26/2019: Stage IVB (cT4, cN3, cM1c) - Signed by Lloyd Huger, MD on 09/26/2019   Lloyd Huger, MD   10/26/2019 10:43 AM

## 2019-10-26 ENCOUNTER — Inpatient Hospital Stay (HOSPITAL_BASED_OUTPATIENT_CLINIC_OR_DEPARTMENT_OTHER): Payer: Managed Care, Other (non HMO) | Admitting: Hospice and Palliative Medicine

## 2019-10-26 ENCOUNTER — Inpatient Hospital Stay: Payer: Managed Care, Other (non HMO)

## 2019-10-26 ENCOUNTER — Inpatient Hospital Stay (HOSPITAL_BASED_OUTPATIENT_CLINIC_OR_DEPARTMENT_OTHER): Payer: Managed Care, Other (non HMO) | Admitting: Oncology

## 2019-10-26 ENCOUNTER — Other Ambulatory Visit: Payer: Self-pay

## 2019-10-26 ENCOUNTER — Encounter: Payer: Self-pay | Admitting: *Deleted

## 2019-10-26 ENCOUNTER — Encounter: Payer: Self-pay | Admitting: Oncology

## 2019-10-26 VITALS — BP 121/62 | HR 101 | Temp 96.0°F | Wt 145.3 lb

## 2019-10-26 DIAGNOSIS — C3491 Malignant neoplasm of unspecified part of right bronchus or lung: Secondary | ICD-10-CM

## 2019-10-26 DIAGNOSIS — Z515 Encounter for palliative care: Secondary | ICD-10-CM

## 2019-10-26 DIAGNOSIS — C3411 Malignant neoplasm of upper lobe, right bronchus or lung: Secondary | ICD-10-CM | POA: Diagnosis not present

## 2019-10-26 LAB — CBC WITH DIFFERENTIAL/PLATELET
Abs Immature Granulocytes: 1.07 10*3/uL — ABNORMAL HIGH (ref 0.00–0.07)
Basophils Absolute: 0.2 10*3/uL — ABNORMAL HIGH (ref 0.0–0.1)
Basophils Relative: 1 %
Eosinophils Absolute: 0.1 10*3/uL (ref 0.0–0.5)
Eosinophils Relative: 0 %
HCT: 38.9 % (ref 36.0–46.0)
Hemoglobin: 13.4 g/dL (ref 12.0–15.0)
Immature Granulocytes: 5 %
Lymphocytes Relative: 7 %
Lymphs Abs: 1.7 10*3/uL (ref 0.7–4.0)
MCH: 31 pg (ref 26.0–34.0)
MCHC: 34.4 g/dL (ref 30.0–36.0)
MCV: 90 fL (ref 80.0–100.0)
Monocytes Absolute: 1.7 10*3/uL — ABNORMAL HIGH (ref 0.1–1.0)
Monocytes Relative: 8 %
Neutro Abs: 18 10*3/uL — ABNORMAL HIGH (ref 1.7–7.7)
Neutrophils Relative %: 79 %
Platelets: 433 10*3/uL — ABNORMAL HIGH (ref 150–400)
RBC: 4.32 MIL/uL (ref 3.87–5.11)
RDW: 14.4 % (ref 11.5–15.5)
WBC: 22.8 10*3/uL — ABNORMAL HIGH (ref 4.0–10.5)
nRBC: 0 % (ref 0.0–0.2)

## 2019-10-26 LAB — COMPREHENSIVE METABOLIC PANEL
ALT: 15 U/L (ref 0–44)
AST: 13 U/L — ABNORMAL LOW (ref 15–41)
Albumin: 3.4 g/dL — ABNORMAL LOW (ref 3.5–5.0)
Alkaline Phosphatase: 97 U/L (ref 38–126)
Anion gap: 11 (ref 5–15)
BUN: 15 mg/dL (ref 8–23)
CO2: 24 mmol/L (ref 22–32)
Calcium: 8.3 mg/dL — ABNORMAL LOW (ref 8.9–10.3)
Chloride: 101 mmol/L (ref 98–111)
Creatinine, Ser: 0.37 mg/dL — ABNORMAL LOW (ref 0.44–1.00)
GFR calc Af Amer: >60 mL/min (ref >60)
GFR calc non Af Amer: >60 mL/min (ref >60)
Glucose, Bld: 159 mg/dL — ABNORMAL HIGH (ref 70–99)
Potassium: 3.1 mmol/L — ABNORMAL LOW (ref 3.5–5.1)
Sodium: 136 mmol/L (ref 135–145)
Total Bilirubin: 0.6 mg/dL (ref 0.3–1.2)
Total Protein: 6.4 g/dL — ABNORMAL LOW (ref 6.5–8.1)

## 2019-10-26 MED ORDER — SODIUM CHLORIDE 0.9 % IV SOLN
10.0000 mg | Freq: Once | INTRAVENOUS | Status: AC
  Administered 2019-10-26: 10 mg via INTRAVENOUS
  Filled 2019-10-26: qty 10

## 2019-10-26 MED ORDER — HYDROCODONE-ACETAMINOPHEN 5-325 MG PO TABS
1.0000 | ORAL_TABLET | Freq: Once | ORAL | Status: AC
  Administered 2019-10-26: 1 via ORAL
  Filled 2019-10-26: qty 1

## 2019-10-26 MED ORDER — SODIUM CHLORIDE 0.9 % IV SOLN
Freq: Once | INTRAVENOUS | Status: AC
  Filled 2019-10-26: qty 250

## 2019-10-26 MED ORDER — SODIUM CHLORIDE 0.9 % IV SOLN
150.0000 mg | Freq: Once | INTRAVENOUS | Status: AC
  Administered 2019-10-26: 150 mg via INTRAVENOUS
  Filled 2019-10-26: qty 150

## 2019-10-26 MED ORDER — PALONOSETRON HCL INJECTION 0.25 MG/5ML
0.2500 mg | Freq: Once | INTRAVENOUS | Status: AC
  Administered 2019-10-26: 0.25 mg via INTRAVENOUS
  Filled 2019-10-26: qty 5

## 2019-10-26 MED ORDER — FAMOTIDINE IN NACL 20-0.9 MG/50ML-% IV SOLN
20.0000 mg | Freq: Once | INTRAVENOUS | Status: AC
  Administered 2019-10-26: 20 mg via INTRAVENOUS
  Filled 2019-10-26: qty 50

## 2019-10-26 MED ORDER — DIPHENHYDRAMINE HCL 50 MG/ML IJ SOLN
25.0000 mg | Freq: Once | INTRAMUSCULAR | Status: AC
  Administered 2019-10-26: 25 mg via INTRAVENOUS
  Filled 2019-10-26: qty 1

## 2019-10-26 MED ORDER — HEPARIN SOD (PORK) LOCK FLUSH 100 UNIT/ML IV SOLN
INTRAVENOUS | Status: AC
  Filled 2019-10-26: qty 5

## 2019-10-26 MED ORDER — SODIUM CHLORIDE 0.9 % IV SOLN
225.0000 mg/m2 | Freq: Once | INTRAVENOUS | Status: AC
  Administered 2019-10-26: 390 mg via INTRAVENOUS
  Filled 2019-10-26: qty 65

## 2019-10-26 MED ORDER — SODIUM CHLORIDE 0.9 % IV SOLN
516.0000 mg | Freq: Once | INTRAVENOUS | Status: AC
  Administered 2019-10-26: 520 mg via INTRAVENOUS
  Filled 2019-10-26: qty 52

## 2019-10-26 MED ORDER — HEPARIN SOD (PORK) LOCK FLUSH 100 UNIT/ML IV SOLN
500.0000 [IU] | Freq: Once | INTRAVENOUS | Status: AC | PRN
  Administered 2019-10-26: 500 [IU]
  Filled 2019-10-26: qty 5

## 2019-10-26 NOTE — Progress Notes (Signed)
  Oncology Nurse Navigator Documentation  Navigator Location: [facility]Med Onc (10/26/19 1000)   )Navigator Encounter Type: Treatment (10/26/19 1000)                     Patient Visit Type: MedOnc (10/26/19 1000) Treatment Phase: Treatment (10/26/19 1000) Barriers/Navigation Needs: No Barriers At This Time (10/26/19 1000)   Interventions: None Required (10/26/19 1000)           met with patient prior to receiving second cycle of chemotherapy. Pt stated that she is doing well with very minimal side effects from treatment. All questions answered during visit. Pt instructed to call if has any further questions or needs. Pt verbalized understanding.            Time Spent with Patient: 30 (10/26/19 1000)

## 2019-10-26 NOTE — Progress Notes (Signed)
HR 101 ok to proceed per MD. K =3.1, patient to increase dietary needs per MD

## 2019-10-26 NOTE — Progress Notes (Signed)
[place]  [phone] [phone]) [phone]   Name: Sandra Leonard Date: 10/26/2019 MRN: [id number]  DOB: [birth year]  Patient Care Team: Barbaraann Boys, MD as PCP - General (Pediatrics) Telford Nab, RN as Oncology Nurse Navigator Grayland Ormond, Kathlene November, MD as Consulting Physician (Oncology)    REASON FOR CONSULTATION: Sandra Leonard is a 64 y.o. 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  Patient initially presented with left facial numbness.  Work-up including CT and MRI revealed a 3 cm right upper lobe lung lesion and at least 3 brain metastases.  PET/CT revealed hypermetabolic right upper lobe lung mass invading the mediastinum with associated right hilar, infrahilar, and subcarinal metastatic adenopathy.  PET also revealed hypermetabolic epicardial lymph nodes, bilateral adrenal gland metastasis, and bone metastasis in the left femur.  Patient is s/p XRT and on systemic chemotherapy.  She was referred to palliative care to address goals and manage ongoing symptoms.  SOCIAL HISTORY:     reports that she has been smoking. She has a 40.00 pack-year smoking history. She has never used smokeless tobacco. She reports that she does not drink alcohol and does not use drugs.  ADVANCE DIRECTIVES:  None on file  CODE STATUS:   PAST MEDICAL HISTORY: Past Medical History:  Diagnosis Date  . Adrenal gland anomaly    bilateral lesion  . Brain lesion    x3  . Diabetes ([place])   . Diabetes mellitus without complication ([place])   . GERD (gastroesophageal reflux disease)   . Heartburn   . Lesion of left femur   . Lung cancer ([place])     PAST SURGICAL HISTORY:  Past Surgical History:  Procedure Laterality Date  . ABDOMINAL HYSTERECTOMY    . CESAREAN SECTION     x2  . PORTA CATH INSERTION N/A 10/01/2019   Procedure: PORTA CATH INSERTION;  Surgeon: Katha Cabal, MD;  Location: [facility];  Service: Cardiovascular;  Laterality: N/A;  . TONSILLECTOMY    . VIDEO BRONCHOSCOPY WITH ENDOBRONCHIAL ULTRASOUND N/A 09/21/2019   Procedure: VIDEO BRONCHOSCOPY WITH ENDOBRONCHIAL ULTRASOUND;  Surgeon: Tyler Pita, MD;  Location: [facility];  Service: Pulmonary;  Laterality: N/A;  FLURO ON STANDBY    HEMATOLOGY/ONCOLOGY HISTORY:  Oncology History  Non-small cell carcinoma of lung, right ([place])  09/26/2019 Initial Diagnosis   Non-small cell carcinoma of lung, right ([place])   09/26/2019 Cancer Staging   Staging form: Lung, AJCC 8th Edition - Clinical stage from 09/26/2019: Stage IVB (cT4, cN3, cM1c) - Signed by Lloyd Huger, MD on 09/26/2019   10/05/2019 -  Chemotherapy   The patient had palonosetron (ALOXI) injection 0.25 mg, 0.25 mg, Intravenous,  Once, 2 of 6 cycles Administration: 0.25 mg (10/05/2019), 0.25 mg (10/26/2019) pegfilgrastim-jmdb (FULPHILA) injection 6 mg, 6 mg, Subcutaneous,  Once, 2 of 6 cycles Administration: 6 mg (10/07/2019) CARBOplatin (PARAPLATIN) 520 mg in sodium chloride 0.9 % 250 mL chemo infusion, 520 mg (100 % of original dose 516 mg), Intravenous,  Once, 2 of 6 cycles Dose modification:   (original dose 516 mg, Cycle 1) Administration: 520 mg (10/05/2019) fosaprepitant (EMEND) 150 mg in sodium chloride 0.9 % 145 mL IVPB, 150 mg, Intravenous,  Once, 2 of 6 cycles Administration: 150 mg (10/05/2019), 150 mg (10/26/2019) PACLitaxel (TAXOL) 390 mg in sodium chloride 0.9 % 500 mL chemo infusion (> 80mg /m2), 225 mg/m2 = 390 mg, Intravenous,  Once, 2 of 6  cycles Administration: 390 mg (10/05/2019)  for chemotherapy treatment.      ALLERGIES:  is allergic to codeine, penicillins, and sulfa antibiotics.  MEDICATIONS:  Current Outpatient Medications  Medication Sig Dispense Refill  . atorvastatin (LIPITOR) 10 MG tablet Take 10 mg by mouth daily.    Marland Kitchen dexamethasone (DECADRON) 4 MG tablet Take 1 tablet (4 mg total) by mouth in the morning and at bedtime.  60 tablet 0  . diphenhydrAMINE (BENADRYL ALLERGY) 25 MG tablet Take 25 mg by mouth at bedtime as needed for sleep.     . empagliflozin (JARDIANCE) 10 MG TABS tablet Take 10 mg by mouth at bedtime.     Marland Kitchen esomeprazole (NEXIUM) 40 MG capsule Take 40 mg by mouth daily.    Marland Kitchen HYDROcodone-acetaminophen (NORCO) 5-325 MG tablet Take 1 tablet by mouth every 6 (six) hours as needed for moderate pain. 30 tablet 0  . lidocaine-prilocaine (EMLA) cream Apply to affected area once 30 g 3  . lisinopril (ZESTRIL) 5 MG tablet Take 5 mg by mouth daily.     . magic mouthwash w/lidocaine SOLN Take 5 mLs by mouth 4 (four) times daily. 240 mL 0  . metFORMIN (GLUCOPHAGE) 1000 MG tablet Take 1 tablet (1,000 mg total) by mouth 2 (two) times daily with a meal. 180 tablet 3  . nicotine (NICODERM CQ - DOSED IN MG/24 HOURS) 21 mg/24hr patch Place onto the skin.    . nicotine (NICOTROL) 10 MG inhaler Inhale 1 Cartridge (1 continuous puffing total) into the lungs as needed for smoking cessation. 42 each 0  . omeprazole (PRILOSEC) 20 MG capsule TAKE 1 CAPSULE (20 MG TOTAL) BY MOUTH DAILY. 30 capsule 6  . ondansetron (ZOFRAN) 8 MG tablet Take 1 tablet (8 mg total) by mouth 2 (two) times daily as needed for refractory nausea / vomiting. 60 tablet 2  . ONE TOUCH ULTRA TEST test strip TEST BLOOD SUGAR IN THE MORNING AND 2 HOURS AFTER LARGEST MEAL 50 each 2  . prochlorperazine (COMPAZINE) 10 MG tablet Take 1 tablet (10 mg total) by mouth every 6 (six) hours as needed (Nausea or vomiting). 60 tablet 2  . sertraline (ZOLOFT) 50 MG tablet Take half a tablet (25mg ) by mouth daily x 1 week and then increase to one tablet (50mg ) daily (Patient taking differently: Take 50 mg by mouth daily. ) 30 tablet 2  . traZODone (DESYREL) 100 MG tablet Take 1-2 tablets (100-200 mg total) by mouth at bedtime as needed for sleep. 60 tablet 2   No current facility-administered medications for this visit.   Facility-Administered Medications Ordered in Other  Visits  Medication Dose Route Frequency Provider Last Rate Last Admin  . CARBOplatin (PARAPLATIN) 520 mg in sodium chloride 0.9 % 250 mL chemo infusion  520 mg Intravenous Once Lloyd Huger, MD      . heparin lock flush 100 unit/mL  500 Units Intracatheter Once PRN Lloyd Huger, MD      . PACLitaxel (TAXOL) 390 mg in sodium chloride 0.9 % 500 mL chemo infusion (> 80mg /m2)  225 mg/m2 (Treatment Plan Recorded) Intravenous Once Lloyd Huger, MD 188 mL/hr at 10/26/19 1045 390 mg at 10/26/19 1045    VITAL SIGNS: There were no vitals taken for this visit. There were no vitals filed for this visit.  Estimated body mass index is 26.58 kg/m as calculated from the following:   Height as of 10/01/19: 5\' 2"  (1.575 m).   Weight as of an earlier encounter on  10/26/19: 145 lb 4.8 oz (65.9 kg).  LABS: CBC:    Component Value Date/Time   WBC 22.8 (H) 10/26/2019 0824   HGB 13.4 10/26/2019 0824   HGB 16.3 (H) 02/28/2017 0832   HCT 38.9 10/26/2019 0824   HCT 47.7 (H) 02/28/2017 0832   PLT 433 (H) 10/26/2019 0824   PLT 337 02/28/2017 0832   MCV 90.0 10/26/2019 0824   MCV 91 02/28/2017 0832   NEUTROABS 18.0 (H) 10/26/2019 0824   NEUTROABS 7.9 (H) 02/28/2017 0832   LYMPHSABS 1.7 10/26/2019 0824   LYMPHSABS 3.5 (H) 02/28/2017 0832   MONOABS 1.7 (H) 10/26/2019 0824   EOSABS 0.1 10/26/2019 0824   EOSABS 0.1 02/28/2017 0832   BASOSABS 0.2 (H) 10/26/2019 0824   BASOSABS 0.1 02/28/2017 0832   Comprehensive Metabolic Panel:    Component Value Date/Time   NA 136 10/26/2019 0824   NA 143 10/09/2017 0810   K 3.1 (L) 10/26/2019 0824   CL 101 10/26/2019 0824   CO2 24 10/26/2019 0824   BUN 15 10/26/2019 0824   BUN 15 10/09/2017 0810   CREATININE 0.37 (L) 10/26/2019 0824   GLUCOSE 159 (H) 10/26/2019 0824   CALCIUM 8.3 (L) 10/26/2019 0824   AST 13 (L) 10/26/2019 0824   ALT 15 10/26/2019 0824   ALKPHOS 97 10/26/2019 0824   BILITOT 0.6 10/26/2019 0824   BILITOT 0.3 10/09/2017 0810    PROT 6.4 (L) 10/26/2019 0824   PROT 6.7 10/09/2017 0810   ALBUMIN 3.4 (L) 10/26/2019 0824   ALBUMIN 4.2 10/09/2017 0810    RADIOGRAPHIC STUDIES: PERIPHERAL VASCULAR CATHETERIZATION  Result Date: 10/01/2019 See op note   PERFORMANCE STATUS (ECOG) : 2 - Symptomatic, <50% confined to bed  Review of Systems Unless otherwise noted, a complete review of systems is negative.  Physical Exam General: NAD Pulmonary: Unlabored Extremities: no edema, no joint deformities Skin: no rashes Neurological: Weakness but otherwise nonfocal  IMPRESSION: Patient was seen in the infusion.  She states that she is doing reasonably well.  She did have a fall last week while trying to feed her dog.  She fell onto her left knee, which has been somewhat swollen since.  She is delayed working with PT due to pain.  We will give her an ice pack today in the clinic and as needed Norco.  Could consider imaging if needed.  Patient reports improved sleep and moods.  She denies any other changes or concerns today.  Dexamethasone is being weaned.  PLAN: -Continue to follow -Continue as needed Norco for pain -Continue Zoloft 50 mg daily and trazodone nightly as needed -We will need to address ACP and MOST form during future visit -RTC in 2 to 3 weeks   Patient expressed understanding and was in agreement with this plan. She also understands that She can call the clinic at any time with any questions, concerns, or complaints.     Time Total: 15 minutes  Visit consisted of counseling and education dealing with the complex and emotionally intense issues of symptom management and palliative care in the setting of serious and potentially life-threatening illness.Greater than 50%  of this time was spent counseling and coordinating care related to the above assessment and plan.  Signed by: Altha Harm, PhD, NP-C

## 2019-10-27 ENCOUNTER — Telehealth: Payer: Self-pay | Admitting: Pulmonary Disease

## 2019-10-27 ENCOUNTER — Ambulatory Visit: Payer: Managed Care, Other (non HMO) | Admitting: Physical Therapy

## 2019-10-27 NOTE — Telephone Encounter (Signed)
Called the number provided and it rings but no answer and there is no VM so unable to leave msg, will call back.

## 2019-10-28 ENCOUNTER — Telehealth: Payer: Self-pay | Admitting: *Deleted

## 2019-10-28 ENCOUNTER — Inpatient Hospital Stay: Payer: Managed Care, Other (non HMO)

## 2019-10-28 NOTE — Telephone Encounter (Signed)
ATC the number provided X2 and it just rings but no answer and unable to leave VM will call back.

## 2019-10-28 NOTE — Telephone Encounter (Signed)
Left vm for case manager regarding pts upcoming home appt for fulphila injection. I have confirmation from Monday that visit is scheduled for 9/1 but no further appt details. I have asked case manager to reach out to pt with appt details and have them give pt direct contact info for further questions regarding home injections.  Case Manager:  Lennette Bihari [phone] ext [phone]

## 2019-10-30 NOTE — Telephone Encounter (Signed)
Tried calling and there was no answer and no option to leave msg

## 2019-11-03 ENCOUNTER — Encounter: Payer: Self-pay | Admitting: Physical Therapy

## 2019-11-03 ENCOUNTER — Other Ambulatory Visit: Payer: Self-pay | Admitting: *Deleted

## 2019-11-03 ENCOUNTER — Ambulatory Visit: Payer: Managed Care, Other (non HMO) | Attending: Oncology | Admitting: Physical Therapy

## 2019-11-03 ENCOUNTER — Telehealth: Payer: Self-pay | Admitting: *Deleted

## 2019-11-03 ENCOUNTER — Other Ambulatory Visit: Payer: Self-pay

## 2019-11-03 DIAGNOSIS — R5383 Other fatigue: Secondary | ICD-10-CM | POA: Diagnosis present

## 2019-11-03 DIAGNOSIS — M6281 Muscle weakness (generalized): Secondary | ICD-10-CM | POA: Diagnosis not present

## 2019-11-03 DIAGNOSIS — R2689 Other abnormalities of gait and mobility: Secondary | ICD-10-CM

## 2019-11-03 DIAGNOSIS — R269 Unspecified abnormalities of gait and mobility: Secondary | ICD-10-CM | POA: Diagnosis present

## 2019-11-03 MED ORDER — FLUCONAZOLE 150 MG PO TABS: 150.0000 mg | ORAL_TABLET | Freq: Every day | ORAL | 0 refills | Status: AC

## 2019-11-03 MED ORDER — MEGESTROL ACETATE 40 MG PO TABS: 40.0000 mg | ORAL_TABLET | Freq: Every day | ORAL | 2 refills | Status: AC

## 2019-11-03 NOTE — Telephone Encounter (Signed)
ATC [place]. Line rang a few times then an automated message stated the line was no longer in service. Will sign off on message, per triage protocol, as there have been several unsuccessful attempts to reach [place].

## 2019-11-03 NOTE — Therapy (Addendum)
[place] [facility] [address]. [place], [place], [address] Phone: [phone]   Fax:  [phone]  Physical Therapy Treatment  Patient Details  Name: Sandra Leonard MRN: [id number] Date of Birth: [birth year] Referring Provider (PT): Dr. Delight Hoh   Encounter Date: 11/03/2019   PT End of Session - 11/03/19 0945    Visit Number 3    Number of Visits 9    Date for PT Re-Evaluation 11/16/19    Authorization - Visit Number 3    Authorization - Number of Visits 10    PT Start Time [place] During Treatment Gait belt    Activity Tolerance Patient tolerated treatment well    Behavior During Therapy [facility] for tasks assessed/performed           Past Medical History:  Diagnosis Date   Adrenal gland anomaly    bilateral lesion   Brain lesion    x3   Diabetes ([place])    Diabetes mellitus without complication (HCC)    GERD (gastroesophageal reflux disease)    Heartburn    Lesion of left femur    Lung cancer ([place])     Past Surgical History:  Procedure Laterality Date   ABDOMINAL HYSTERECTOMY     CESAREAN SECTION     x2   PORTA CATH INSERTION N/A 10/01/2019   Procedure: PORTA CATH INSERTION;  Surgeon: Katha Cabal, MD;  Location: [place] [facility];  Service: Cardiovascular;  Laterality: N/A;   TONSILLECTOMY     VIDEO BRONCHOSCOPY WITH ENDOBRONCHIAL ULTRASOUND N/A 09/21/2019   Procedure: VIDEO BRONCHOSCOPY WITH ENDOBRONCHIAL ULTRASOUND;  Surgeon: Tyler Pita, MD;  Location: [facility];  Service: Pulmonary;  Laterality: N/A;  FLURO ON STANDBY    There were no vitals filed for this visit.   Subjective Assessment - 11/03/19 0934    Subjective Pt. states that she fell last week trying to feed the dog. Pt. states that her L knee hurts due to impact from fall. The knee was swollen, but is no longer swollen. Pt. states that walking does not hurt the knee, but some of the exercises hurt her knee. Chemo  has made her very tired last week, and still feels it this week.    Patient is accompained by: Family member    Patient Stated Goals wants to be able walk and garden again    Currently in Pain? No/denies            Neuro re-ed:  Walking with rollator: pt walking around clinic with rollator. Pt demonstrated improved gait speed and appeared more confident. Pt required cueing to keep rollator close to her.   Step practice in // bars: Pt initially practicing on 3" plinth with UE support on bars. Pt progressed to 6" step, varying confidence with either foot leading.   There Ex:  Supine R hamstring stretch: 3x30 sec holds  Supine quad ext with 3lb weights off red bolster: 15x each leg   Supine bridge from red bolster: 15x 2sec hold at top  Supine hip flex: initially 3lb ankle weights on both legs, after 3 reps switched to 1.5lb ankle weight on L leg.                             PT Long Term Goals - 10/22/19 1050      PT LONG TERM GOAL #1   Title Pt will improve FOTO  to 55 to improve pain free functional mobility.    Baseline 8/23: 40/55    Time 4    Period Weeks    Status New    Target Date 11/16/19      PT LONG TERM GOAL #2   Title Pt will improve DGI to 22/24 with LRAD to decrease falls risk.    Baseline 8/23: 15/24    Time 4    Period Weeks    Status New    Target Date 11/16/19      PT LONG TERM GOAL #3   Title Pt. will improve strength by half a muscle grade in bilat LE to improve functional mobility.    Baseline 8/23: R/L (out of 5) 3+/3+ Hip flexion, 5/5 Hip abduction (seated), 4+/4+ Hip adduction (seated), 4/3+ Knee extension, 4+/4 Knee flexion, 4/0 Ankle dorsiflexion    Time 4    Period Weeks    Status New    Target Date 11/16/19      PT LONG TERM GOAL #4   Title Pt. will be able to come to standing from floor independently to improve fall recovery.    Baseline 8/23: Pt unable to get up off floor independently at this time    Time 4     Period Weeks    Status New    Target Date 11/16/19                  Patient will benefit from skilled therapeutic intervention in order to improve the following deficits and impairments:     Visit Diagnosis: Muscle weakness (generalized)  Gait difficulty  Fatigue, unspecified type  Imbalance     Problem List Patient Active Problem List   Diagnosis Date Noted   Non-small cell carcinoma of lung, right ([place]) 09/26/2019   Goals of care, counseling/discussion 09/26/2019   Facial paresthesia 07/29/2019   Overweight 05/05/2019   Colon polyp 07/11/2017   Hyperlipidemia, mixed 07/11/2017   GERD (gastroesophageal reflux disease) 11/20/2016   Tobacco use 11/20/2016   Type 2 diabetes mellitus not at goal [facility]) 01/30/2016   Pura Spice, PT, DPT # [id number] Carlyle Basques, SPT 11/03/2019, 9:46 AM  [place] [facility] [address]. [place], [place], [address] Phone: [phone]   Fax:  [phone]  Name: Sandra Leonard MRN: [id number] Date of Birth: [birth year]

## 2019-11-03 NOTE — Telephone Encounter (Signed)
I called patient to follow up and see how she was doing after receiving chemotherapy last week. Patient reports decreased appetite and ongoing issues with thrush. She requested that we refill flucanazole, [facility]. Patient also interested in starting on appetite stimulant that is not steroid based, prescription for Megace sent to patients pharmacy per Dr. Grayland Ormond. Patient also reports she might have infection in 2 upper teeth contributing to pain and swelling in her face, recommended that she follow up with her dentist after discussion with Dr. Grayland Ormond. Patient also requesting approval for electrical stimulation therapy for foot drop to be done at physical therapy, ok per Dr. Dyann Ruddle call and follow up to give the ok to proceed.  Hayley-just an FYI as I think patient might have left you a vm as well.

## 2019-11-05 ENCOUNTER — Encounter: Payer: Self-pay | Admitting: Physical Therapy

## 2019-11-05 ENCOUNTER — Ambulatory Visit: Payer: Managed Care, Other (non HMO)

## 2019-11-05 ENCOUNTER — Other Ambulatory Visit: Payer: Self-pay

## 2019-11-05 DIAGNOSIS — R5383 Other fatigue: Secondary | ICD-10-CM

## 2019-11-05 DIAGNOSIS — M6281 Muscle weakness (generalized): Secondary | ICD-10-CM

## 2019-11-05 DIAGNOSIS — R269 Unspecified abnormalities of gait and mobility: Secondary | ICD-10-CM

## 2019-11-05 DIAGNOSIS — R2689 Other abnormalities of gait and mobility: Secondary | ICD-10-CM

## 2019-11-05 NOTE — Therapy (Addendum)
[place] [facility] [address] [place], [place], [address] Phone: [phone]   Fax:  [phone]  Physical Therapy Treatment  Patient Details  Name: Sandra Leonard MRN: [id number] Date of Birth: Jul 21, 1955 Referring Provider (PT): Dr. Delight Hoh   Encounter Date: 11/05/2019     11/05/19 1028  PT Visits / Re-Eval  Visit Number 4  Number of Visits 9  Date for PT Re-Evaluation 11/16/19  Authorization  Authorization - Visit Number 4  Authorization - Number of Visits 10  PT Time Calculation  PT Start Time 0945  PT Stop Time 1028  PT Time Calculation (min) 43 min  PT - End of Session  Equipment Utilized During Treatment Gait belt  Activity Tolerance Patient tolerated treatment well  Behavior During Therapy [facility] for tasks assessed/performed     Past Medical History:  Diagnosis Date  . Adrenal gland anomaly    bilateral lesion  . Brain lesion    x3  . Diabetes ([place])   . Diabetes mellitus without complication ([place])   . GERD (gastroesophageal reflux disease)   . Heartburn   . Lesion of left femur   . Lung cancer [facility])     Past Surgical History:  Procedure Laterality Date  . ABDOMINAL HYSTERECTOMY    . CESAREAN SECTION     x2  . PORTA CATH INSERTION N/A 10/01/2019   Procedure: PORTA CATH INSERTION;  Surgeon: Katha Cabal, MD;  Location: [place] [facility];  Service: Cardiovascular;  Laterality: N/A;  . TONSILLECTOMY    . VIDEO BRONCHOSCOPY WITH ENDOBRONCHIAL ULTRASOUND N/A 09/21/2019   Procedure: VIDEO BRONCHOSCOPY WITH ENDOBRONCHIAL ULTRASOUND;  Surgeon: Tyler Pita, MD;  Location: [facility];  Service: Pulmonary;  Laterality: N/A;  FLURO ON STANDBY    There were no vitals filed for this visit.  There Ex: Ice pack on L knee: 15 mins  Prone Hip Extension: 5x each leg Prone Hip Extension with Bent Knee: 5x each leg Clamshell: 3x on R sidelying Supine Bridge: 10x each leg Supine Short Arc Quad with bolster  under knees: 10x each leg  Supine Hip Abduction with crossed YTB holding tension with arms: 10x Supine bicep curls with YTB around knees: 10x Supine shoulder flexion with YTB around knees: 10x Supine March: 10x each leg  Supine Straight Leg Raises: 10x each leg     Neuro Re-Ed:   Practicing stand to floor < --> floor to stand transfers with use of standard chair for UE assist for lowering and standing. CGA+1 and tactile and verbal cues throughout for form and safety. Pt required mod assit x2 to return to standing after 3 attempts.       PT Long Term Goals - 10/22/19 1050      PT LONG TERM GOAL #1   Title Pt will improve FOTO to 55 to improve pain free functional mobility.    Baseline 8/23: 40/55    Time 4    Period Weeks    Status New    Target Date 11/16/19      PT LONG TERM GOAL #2   Title Pt will improve DGI to 22/24 with LRAD to decrease falls risk.    Baseline 8/23: 15/24    Time 4    Period Weeks    Status New    Target Date 11/16/19      PT LONG TERM GOAL #3   Title Pt. will improve strength by half a muscle grade in bilat LE to  improve functional mobility.    Baseline 8/23: R/L (out of 5) 3+/3+ Hip flexion, 5/5 Hip abduction (seated), 4+/4+ Hip adduction (seated), 4/3+ Knee extension, 4+/4 Knee flexion, 4/0 Ankle dorsiflexion    Time 4    Period Weeks    Status New    Target Date 11/16/19      PT LONG TERM GOAL #4   Title Pt. will be able to come to standing from floor independently to improve fall recovery.    Baseline 8/23: Pt unable to get up off floor independently at this time    Time 4    Period Weeks    Status New    Target Date 11/16/19               11/05/19 1050  Plan  Clinical Impression Statement Pt. reports that she has enjoyed the supine based exercises last session. Pt. given more mat exercises in supine, prone, and sidelying to improve LE strength and UE strength. Pt. attempted floor transfer today, she was able to get down with CGA,  however required mod assist X2 in front of chair to return to standing. Pt. given new HEP that included more supine/prone/sidelying exercises. Pt. will continue to benefit from skilled PT to improve balance, strength, and ambulation to decrease falls risk.  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, diabetes  Pt will benefit from skilled therapeutic intervention in order to improve on the following deficits Abnormal gait;Decreased activity tolerance;Decreased balance;Decreased cognition;Decreased mobility;Decreased endurance;Decreased coordination;Decreased strength;Difficulty walking  Stability/Clinical Decision Making Unstable/Unpredictable  Clinical Decision Making High  Rehab Potential Fair  PT Frequency 2x / week  PT Duration 4 weeks  PT Treatment/Interventions ADLs/Self Care Home Management;Cryotherapy;Electrical Stimulation;Neuromuscular re-education;Balance training;Therapeutic exercise;Therapeutic activities;Functional mobility training;Gait training;Stair training;Patient/family education  PT Next Visit Plan balance, floor transfers, gait training  Consulted and Agree with Plan of Care Patient;Family member/caregiver  Family Member Consulted sister      Visit Diagnosis: No diagnosis found.     Problem List Patient Active Problem List   Diagnosis Date Noted  . Non-small cell carcinoma of lung, right ([place]) 09/26/2019  . Goals of care, counseling/discussion 09/26/2019  . Facial paresthesia 07/29/2019  . Overweight 05/05/2019  . Colon polyp 07/11/2017  . Hyperlipidemia, mixed 07/11/2017  . GERD (gastroesophageal reflux disease) 11/20/2016  . Tobacco use 11/20/2016  . Type 2 diabetes mellitus not at goal [facility]) 01/30/2016    Carlyle Basques, SPT 11/05/2019, 9:44 AM  [place] [facility] [address] [place], [place], [address] Phone: [phone]   Fax:   [phone]  Name: Sandra Leonard MRN: [id number] Date of Birth: 02/07/1956

## 2019-11-07 ENCOUNTER — Ambulatory Visit (INDEPENDENT_AMBULATORY_CARE_PROVIDER_SITE_OTHER): Payer: Managed Care, Other (non HMO)

## 2019-11-07 ENCOUNTER — Ambulatory Visit
Admission: EM | Admit: 2019-11-07 | Discharge: 2019-11-07 | Disposition: A | Discharge status: Home / Self Care | Payer: Managed Care, Other (non HMO)

## 2019-11-07 ENCOUNTER — Encounter: Payer: Self-pay | Admitting: Emergency Medicine

## 2019-11-07 ENCOUNTER — Other Ambulatory Visit: Payer: Self-pay

## 2019-11-07 DIAGNOSIS — L02413 Cutaneous abscess of right upper limb: Secondary | ICD-10-CM

## 2019-11-07 DIAGNOSIS — L0889 Other specified local infections of the skin and subcutaneous tissue: Secondary | ICD-10-CM

## 2019-11-07 NOTE — ED Provider Notes (Signed)
[facility]    CSN: [id number] Arrival date & time: 11/07/19  0801      History   Chief Complaint Chief Complaint  Patient presents with  . Abscess    HPI Sandra Leonard is a 63 y.o. female.   Patient presents for suspected abscess on right elbow.  She reports she has had swelling and issues with this elbow for several months.  She reports this is been due to using her elbows to get around while being in cancer treatments and having generalized muscle weakness.  She reports a little more inflammation and some drainage over the last 2 days.  She reports puslike drainage started last night.  She reports she called her cancer center and they started her on doxycycline yesterday.  They have also seen the elbow in the past and I told her to monitor this.  She reports in the past she will have a scab there chronically and in the elbow which will cause it to open.  She has not had any new injuries.  Denies any fevers or chills.  She is able to move the elbow.      Past Medical History:  Diagnosis Date  . Adrenal gland anomaly    bilateral lesion  . Brain lesion    x3  . Diabetes ([place])   . Diabetes mellitus without complication ([place])   . GERD (gastroesophageal reflux disease)   . Heartburn   . Lesion of left femur   . Lung cancer [facility])     Patient Active Problem List   Diagnosis Date Noted  . Non-small cell carcinoma of lung, right ([place]) 09/26/2019  . Goals of care, counseling/discussion 09/26/2019  . Facial paresthesia 07/29/2019  . Overweight 05/05/2019  . Colon polyp 07/11/2017  . Hyperlipidemia, mixed 07/11/2017  . GERD (gastroesophageal reflux disease) 11/20/2016  . Tobacco use 11/20/2016  . Type 2 diabetes mellitus not at goal [facility]) 01/30/2016    Past Surgical History:  Procedure Laterality Date  . ABDOMINAL HYSTERECTOMY    . CESAREAN SECTION     x2  . PORTA CATH INSERTION N/A 10/01/2019   Procedure: PORTA CATH INSERTION;  Surgeon: Katha Cabal, MD;   Location: [place] [facility];  Service: Cardiovascular;  Laterality: N/A;  . TONSILLECTOMY    . VIDEO BRONCHOSCOPY WITH ENDOBRONCHIAL ULTRASOUND N/A 09/21/2019   Procedure: VIDEO BRONCHOSCOPY WITH ENDOBRONCHIAL ULTRASOUND;  Surgeon: Tyler Pita, MD;  Location: [facility];  Service: Pulmonary;  Laterality: N/A;  FLURO ON STANDBY    OB History   No obstetric history on file.      Home Medications    Prior to Admission medications   Medication Sig Start Date End Date Taking? Authorizing Provider  atorvastatin (LIPITOR) 10 MG tablet Take 10 mg by mouth daily. 01/15/19  Yes [provider]  doxycycline (VIBRAMYCIN) 100 MG capsule Take 100 mg by mouth 2 (two) times daily. 11/06/19  Yes [provider]  empagliflozin (JARDIANCE) 10 MG TABS tablet Take 10 mg by mouth at bedtime.  01/15/19  Yes [provider]  esomeprazole (NEXIUM) 40 MG capsule Take 40 mg by mouth daily.   Yes [provider]  HYDROcodone-acetaminophen (NORCO) 5-325 MG tablet Take 1 tablet by mouth every 6 (six) hours as needed for moderate pain. 10/08/19  Yes Borders, Kirt Boys, NP  metFORMIN (GLUCOPHAGE) 1000 MG tablet Take 1 tablet (1,000 mg total) by mouth 2 (two) times daily with a meal. 10/27/15  Yes Caryn Section Linden Dolin,  PA-C  ondansetron (ZOFRAN) 8 MG tablet Take 1 tablet (8 mg total) by mouth 2 (two) times daily as needed for refractory nausea / vomiting. 09/26/19  Yes Lloyd Huger, MD  prochlorperazine (COMPAZINE) 10 MG tablet Take 1 tablet (10 mg total) by mouth every 6 (six) hours as needed (Nausea or vomiting). 09/26/19  Yes Lloyd Huger, MD  sertraline (ZOLOFT) 50 MG tablet Take half a tablet (25mg ) by mouth daily x 1 week and then increase to one tablet (50mg ) daily Patient taking differently: Take 50 mg by mouth daily.  09/10/19  Yes Borders, Kirt Boys, NP  traZODone (DESYREL) 100 MG tablet Take 1-2 tablets (100-200 mg total) by mouth at bedtime as needed for sleep.  09/24/19  Yes Borders, Kirt Boys, NP  dexamethasone (DECADRON) 4 MG tablet Take 1 tablet (4 mg total) by mouth in the morning and at bedtime. 09/10/19   Noreene Filbert, MD  diphenhydrAMINE (BENADRYL ALLERGY) 25 MG tablet Take 25 mg by mouth at bedtime as needed for sleep.     [provider]  fluconazole (DIFLUCAN) 150 MG tablet Take 1 tablet (150 mg total) by mouth daily for 7 days. 11/03/19 11/10/19  Lloyd Huger, MD  lidocaine-prilocaine (EMLA) cream Apply to affected area once 09/26/19   Lloyd Huger, MD  lisinopril (ZESTRIL) 5 MG tablet Take 5 mg by mouth daily.     [provider]  magic mouthwash w/lidocaine SOLN Take 5 mLs by mouth 4 (four) times daily. 09/23/19   Jacquelin Hawking, NP  megestrol (MEGACE) 40 MG tablet Take 1 tablet (40 mg total) by mouth daily. 11/03/19   Lloyd Huger, MD  nicotine (NICODERM CQ - DOSED IN MG/24 HOURS) 21 mg/24hr patch Place onto the skin. 09/23/19   [provider]  nicotine (NICOTROL) 10 MG inhaler Inhale 1 Cartridge (1 continuous puffing total) into the lungs as needed for smoking cessation. 09/23/19   Jacquelin Hawking, NP  omeprazole (PRILOSEC) 20 MG capsule TAKE 1 CAPSULE (20 MG TOTAL) BY MOUTH DAILY. 06/04/16   Caryn Section Linden Dolin, PA-C  ONE TOUCH ULTRA TEST test strip TEST BLOOD SUGAR IN THE MORNING AND 2 HOURS AFTER LARGEST MEAL 09/15/15   Sable Feil, PA-C    Family History Family History  Problem Relation Age of Onset  . Breast cancer Mother 82  . Diabetes Mother   . Hypertension Mother   . Hypertension Father   . Diabetes Sister   . Hypertension Sister   . Diabetes Brother     Social History Social History   Tobacco Use  . Smoking status: Current Every Day Smoker    Packs/day: 1.00    Years: 40.00    Pack years: 40.00  . Smokeless tobacco: Never Used  . Tobacco comment: 7-8 cigarettes a day--10/13/2019  Vaping Use  . Vaping Use: Never used  Substance Use Topics  . Alcohol use: No     Alcohol/week: 0.0 standard drinks  . Drug use: No     Allergies   Codeine, Penicillins, and Sulfa antibiotics   Review of Systems Review of Systems   Physical Exam Triage Vital Signs ED Triage Vitals  Enc Vitals Group     BP      Pulse      Resp      Temp      Temp src      SpO2      Weight      Height  Head Circumference      Peak Flow      Pain Score      Pain Loc      Pain Edu?      Excl. in [place]?    No data found.  Updated Vital Signs BP 121/71 (BP Location: Right Arm)   Pulse (!) 108   Temp 98.7 F (37.1 C) (Oral)   Resp 14   Ht 5\' 2"  (1.575 m)   Wt 144 lb (65.3 kg)   SpO2 99%   BMI 26.34 kg/m   Visual Acuity Right Eye Distance:   Left Eye Distance:   Bilateral Distance:    Right Eye Near:   Left Eye Near:    Bilateral Near:     Physical Exam Vitals and nursing note reviewed.  Constitutional:      General: She is not in acute distress.    Appearance: She is well-developed.  HENT:     Head: Normocephalic and atraumatic.  Eyes:     Conjunctiva/sclera: Conjunctivae normal.  Cardiovascular:     Rate and Rhythm: Normal rate.  Pulmonary:     Effort: Pulmonary effort is normal. No respiratory distress.  Musculoskeletal:     Cervical back: Neck supple.     Comments: Swelling over the right olecranon area.  There is a scab with open wound with trace serous/purulent discharge.  Mild erythema surrounding.  Only mildly tender to touch.  Patient is full range of motion the elbow.  No antecubital swelling.  Skin:    General: Skin is warm and dry.     Capillary Refill: Capillary refill takes less than 2 seconds.     Findings: No rash.  Neurological:     Mental Status: She is alert.      UC Treatments / Results  Labs (all labs ordered are listed, but only abnormal results are displayed) Labs Reviewed - No data to display  EKG   Radiology DG Elbow Complete Right  Result Date: 11/07/2019 CLINICAL DATA:  Soft tissue infection at the  level of the olecranon EXAM: RIGHT ELBOW - COMPLETE 3+ VIEW COMPARISON:  None. FINDINGS: Frontal, lateral, and bilateral oblique views were obtained. No fracture or dislocation. No joint effusion. No appreciable joint space narrowing or erosion. There is soft tissue prominence at the level of the olecranon process of the proximal ulna without associated soft tissue air. No erosions. IMPRESSION: Soft tissue prominence in the region of the olecranon. This area of soft tissue prominence measures 2.3 x 1.0 cm. No associated air. This finding could represent olecranon bursitis or potential infectious lesion. Well-defined abscess by radiography not confirmed. No associated bony erosion or bony destruction. No fracture or dislocation. No appreciable joint space narrowing or joint effusion. Electronically Signed   By: Lowella Grip III M.D.   On: 11/07/2019 09:05    Procedures Procedures (including critical care time)  Medications Ordered in UC Medications - No data to display  Initial Impression / Assessment and Plan / UC Course  I have reviewed the triage vital signs and the nursing notes.  Pertinent labs & imaging results that were available during my care of the patient were reviewed by me and considered in my medical decision making (see chart for details).     #Right elbow abscess Patient is a 64 year old presenting with abscess right elbow.  There is active drainage.  Concern that this is actually septic bursitis.  She is afebrile and x-ray did not show clear sign of abscess, osteomyelitis  or septic joint.  Placed on doxycycline by her oncologist we will have her continue this.  Will refer her to orthopedics and have her follow-up with primary care physician.  Discussed strict emergency department referral options for development of any rapidly spreading swelling, redness or development of fever.  She verbalized agreement and understanding plan of care Final Clinical Impressions(s) / UC  Diagnoses   Final diagnoses:  Abscess of right elbow     Discharge Instructions     Continue the doxycycline as prescribed by your doctor  Keep the wound covered  Call your primary care, cancer doctors on Monday.  You should also follow-up with the orthopedist for further evaluation.  If you develop fever, significant worsening swelling go to the emergency department      ED Prescriptions    None     PDMP not reviewed this encounter.   Purnell Shoemaker, PA-C 11/07/19 2028

## 2019-11-07 NOTE — Discharge Instructions (Addendum)
Continue the doxycycline as prescribed by your doctor  Keep the wound covered  Call your primary care, cancer doctors on Monday.  You should also follow-up with the orthopedist for further evaluation.  If you develop fever, significant worsening swelling go to the emergency department

## 2019-11-07 NOTE — ED Triage Notes (Signed)
Patient c/o right elbow abscess that started couple of months ago.  Patient reports drainage from her abscess.  Patient states that started Doxycycline last night.  Patient states that she is currently on chemo.  Patient denies fevers.

## 2019-11-10 ENCOUNTER — Ambulatory Visit: Payer: Managed Care, Other (non HMO) | Admitting: Physical Therapy

## 2019-11-12 ENCOUNTER — Ambulatory Visit: Payer: Managed Care, Other (non HMO) | Admitting: Physical Therapy

## 2019-11-13 ENCOUNTER — Encounter: Payer: Self-pay | Admitting: Oncology

## 2019-11-13 NOTE — Progress Notes (Signed)
Sandra Leonard  [phone] [phone]) [phone]  ID: [name] OB: 05/23/1955  MR#: [id number]  WER#:[id number]  Patient Care Team: Barbaraann Boys, MD as PCP - General (Pediatrics) Telford Nab, RN as Oncology Nurse Navigator Grayland Ormond, Kathlene November, MD as Consulting Physician (Oncology) Tyler Pita, MD as Consulting Physician (Pulmonary Disease)   CHIEF COMPLAINT: Stage IVb non-small cell lung cancer, NOS.  INTERVAL HISTORY: Patient returns to clinic today for further evaluation and consideration of cycle 3 of carboplatin and Taxol.  She has a poor appetite and significant weight loss recently.  She continues to have chronic weakness and fatigue.  She continues to have left foot drop which is helped by her brace.  She has no other neurologic complaints.  She denies any recent fevers or illnesses.  She has a good appetite and denies weight loss.  She has no chest pain, shortness of breath, cough, or hemoptysis.  She denies any nausea, vomiting, constipation, or diarrhea.  She has no melena or hematochezia.  She has no urinary complaints.  Patient feels generally terrible, but offers no further specific complaints today.  REVIEW OF SYSTEMS:   Review of Systems  Constitutional: Positive for malaise/fatigue and weight loss. Negative for fever.  Respiratory: Negative.  Negative for cough, hemoptysis and shortness of breath.   Cardiovascular: Negative.  Negative for chest pain and leg swelling.  Gastrointestinal: Negative.  Negative for abdominal pain.  Genitourinary: Negative.  Negative for dysuria.  Musculoskeletal: Negative.  Negative for back pain.  Skin: Negative.  Negative for rash.  Neurological: Positive for focal weakness and weakness. Negative for dizziness, tingling, speech change and headaches.  Psychiatric/Behavioral: Negative.  The patient is not nervous/anxious.     As per HPI. Otherwise, a complete review of systems is negative.  PAST  MEDICAL HISTORY: Past Medical History:  Diagnosis Date  . Adrenal gland anomaly    bilateral lesion  . Brain lesion    x3  . Diabetes ([place])   . Diabetes mellitus without complication ([place])   . GERD (gastroesophageal reflux disease)   . Heartburn   . Lesion of left femur   . Lung cancer ([place])     PAST SURGICAL HISTORY: Past Surgical History:  Procedure Laterality Date  . ABDOMINAL HYSTERECTOMY    . CESAREAN SECTION     x2  . PORTA CATH INSERTION N/A 10/01/2019   Procedure: PORTA CATH INSERTION;  Surgeon: Katha Cabal, MD;  Location: [facility];  Service: Cardiovascular;  Laterality: N/A;  . TONSILLECTOMY    . VIDEO BRONCHOSCOPY WITH ENDOBRONCHIAL ULTRASOUND N/A 09/21/2019   Procedure: VIDEO BRONCHOSCOPY WITH ENDOBRONCHIAL ULTRASOUND;  Surgeon: Tyler Pita, MD;  Location: [facility];  Service: Pulmonary;  Laterality: N/A;  FLURO ON STANDBY    FAMILY HISTORY: Family History  Problem Relation Age of Onset  . Breast cancer Mother 52  . Diabetes Mother   . Hypertension Mother   . Hypertension Father   . Diabetes Sister   . Hypertension Sister   . Diabetes Brother     ADVANCED DIRECTIVES (Y/N):  N  HEALTH MAINTENANCE: Social History   Tobacco Use  . Smoking status: Current Every Day Smoker    Packs/day: 1.00    Years: 40.00    Pack years: 40.00  . Smokeless tobacco: Never Used  . Tobacco comment: 7-8 cigarettes a day--10/13/2019  Vaping Use  . Vaping Use: Never used  Substance Use Topics  . Alcohol use: No    Alcohol/week:  0.0 standard drinks  . Drug use: No     Colonoscopy:  PAP:  Bone density:  Lipid panel:  Allergies  Allergen Reactions  . Codeine     "makes her crazy"  . Penicillins Rash  . Sulfa Antibiotics Rash    Current Outpatient Medications  Medication Sig Dispense Refill  . atorvastatin (LIPITOR) 10 MG tablet Take 10 mg by mouth daily.    . diphenhydrAMINE (BENADRYL ALLERGY) 25 MG tablet Take 25 mg by mouth at bedtime as  needed for sleep.     Marland Kitchen doxycycline (VIBRAMYCIN) 100 MG capsule Take 100 mg by mouth 2 (two) times daily.    . empagliflozin (JARDIANCE) 10 MG TABS tablet Take 10 mg by mouth at bedtime.     Marland Kitchen esomeprazole (NEXIUM) 40 MG capsule Take 40 mg by mouth daily.    Marland Kitchen lidocaine-prilocaine (EMLA) cream Apply to affected area once 30 g 3  . lisinopril (ZESTRIL) 5 MG tablet Take 5 mg by mouth daily.     . magic mouthwash w/lidocaine SOLN Take 5 mLs by mouth 4 (four) times daily. 240 mL 0  . megestrol (MEGACE) 40 MG tablet Take 1 tablet (40 mg total) by mouth daily. 30 tablet 2  . metFORMIN (GLUCOPHAGE) 1000 MG tablet Take 1 tablet (1,000 mg total) by mouth 2 (two) times daily with a meal. 180 tablet 3  . nicotine (NICODERM CQ - DOSED IN MG/24 HOURS) 21 mg/24hr patch Place onto the skin.    . nicotine (NICOTROL) 10 MG inhaler Inhale 1 Cartridge (1 continuous puffing total) into the lungs as needed for smoking cessation. 42 each 0  . omeprazole (PRILOSEC) 20 MG capsule TAKE 1 CAPSULE (20 MG TOTAL) BY MOUTH DAILY. 30 capsule 6  . ondansetron (ZOFRAN) 8 MG tablet Take 1 tablet (8 mg total) by mouth 2 (two) times daily as needed for refractory nausea / vomiting. 60 tablet 2  . ONE TOUCH ULTRA TEST test strip TEST BLOOD SUGAR IN THE MORNING AND 2 HOURS AFTER LARGEST MEAL 50 each 2  . prochlorperazine (COMPAZINE) 10 MG tablet Take 1 tablet (10 mg total) by mouth every 6 (six) hours as needed (Nausea or vomiting). 60 tablet 2  . sertraline (ZOLOFT) 50 MG tablet Take half a tablet (78m) by mouth daily x 1 week and then increase to one tablet (559m daily (Patient taking differently: Take 50 mg by mouth daily. ) 30 tablet 2  . traZODone (DESYREL) 100 MG tablet Take 1-2 tablets (100-200 mg total) by mouth at bedtime as needed for sleep. 60 tablet 2  . dexamethasone (DECADRON) 4 MG tablet Take 1 tablet (4 mg total) by mouth daily. 30 tablet 1  . HYDROcodone-acetaminophen (NORCO) 5-325 MG tablet Take 1 tablet by mouth  every 6 (six) hours as needed for moderate pain. 30 tablet 0  . potassium chloride (KLOR-CON) 20 MEQ packet Take 20 mEq by mouth 2 (two) times daily. 30 packet 2   No current facility-administered medications for this visit.   Facility-Administered Medications Ordered in Other Visits  Medication Dose Route Frequency Provider Last Rate Last Admin  . heparin lock flush 100 unit/mL  500 Units Intravenous Once FiLloyd HugerMD      . sodium chloride 0.9 % 1,000 mL with potassium chloride 40 mEq infusion   Intravenous Once FiLloyd HugerMD 500 mL/hr at 11/16/19 1001 New Bag at 11/16/19 1001  . sodium chloride flush (NS) 0.9 % injection 10 mL  10 mL Intravenous PRN  Lloyd Huger, MD   10 mL at 11/16/19 0820    OBJECTIVE: Vitals:   11/16/19 0837  BP: (!) 135/99  Pulse: (!) 118  Resp: 20  Temp: (!) 96.6 F (35.9 C)  SpO2: 100%     Body mass index is 24.05 kg/m.    ECOG FS:0 - Asymptomatic  General: Well-developed, well-nourished, no acute distress.  Sitting in a wheelchair. Eyes: Pink conjunctiva, anicteric sclera. HEENT: Normocephalic, moist mucous membranes. Lungs: No audible wheezing or coughing. Heart: Regular rate and rhythm. Abdomen: Soft, nontender, no obvious distention. Musculoskeletal: No edema, cyanosis, or clubbing. Neuro: Alert, answering all questions appropriately. Cranial nerves grossly intact. Skin: No rashes or petechiae noted. Psych: Normal affect.  LAB RESULTS:  Lab Results  Component Value Date   NA 137 11/16/2019   K 2.5 (LL) 11/16/2019   CL 102 11/16/2019   CO2 17 (L) 11/16/2019   GLUCOSE 192 (H) 11/16/2019   BUN 21 11/16/2019   CREATININE 0.64 11/16/2019   CALCIUM 8.4 (L) 11/16/2019   PROT 7.1 11/16/2019   ALBUMIN 3.6 11/16/2019   AST 16 11/16/2019   ALT 10 11/16/2019   ALKPHOS 80 11/16/2019   BILITOT 1.2 11/16/2019   GFRNONAA >60 11/16/2019   GFRAA >60 11/16/2019    Lab Results  Component Value Date   WBC 14.9 (H)  11/16/2019   NEUTROABS 11.3 (H) 11/16/2019   HGB 13.4 11/16/2019   HCT 38.4 11/16/2019   MCV 88.3 11/16/2019   PLT 596 (H) 11/16/2019     STUDIES: DG Elbow Complete Right  Result Date: 11/07/2019 CLINICAL DATA:  Soft tissue infection at the level of the olecranon EXAM: RIGHT ELBOW - COMPLETE 3+ VIEW COMPARISON:  None. FINDINGS: Frontal, lateral, and bilateral oblique views were obtained. No fracture or dislocation. No joint effusion. No appreciable joint space narrowing or erosion. There is soft tissue prominence at the level of the olecranon process of the proximal ulna without associated soft tissue air. No erosions. IMPRESSION: Soft tissue prominence in the region of the olecranon. This area of soft tissue prominence measures 2.3 x 1.0 cm. No associated air. This finding could represent olecranon bursitis or potential infectious lesion. Well-defined abscess by radiography not confirmed. No associated bony erosion or bony destruction. No fracture or dislocation. No appreciable joint space narrowing or joint effusion. Electronically Signed   By: Lowella Grip III M.D.   On: 11/07/2019 09:05    ASSESSMENT: Stage IVb non-small cell lung cancer, NOS. No actionable mutations, PD-L1 0%.  PLAN:    1. Stage IVb non-small cell lung cancer, NOS: Biopsy consistent with non-small cell carcinoma of lung.  Liquid biopsy results as above.  PET scan results from September 07, 2019 reviewed independently with right upper lobe lung lesion invading the mediastinum, small bilateral pulmonary nodules, and bilateral adrenal metastasis.  Patient also has cortical bone metastasis in left femur.  MRI of the brain was completed at [facility].  Patient has now completed XRT.  Since malignancy cannot be further specified other than non-small cell carcinoma, patient will receive carboplatinum and Taxol with Fulphilia support every 3 weeks for 4 cycles and then reimage.  Delay cycle 3 of treatment secondary to declining  performance status, weight loss, and electrolyte abnormalities.  Return to clinic in 1 week for further evaluation and reconsideration of cycle 3.  Patient does her Fulphilia shots at home 24 to 48 hours after treatment.  2.  Facial numbness: Improving.  Secondary to brain metastasis.  XRT has been  completed. 3.  Brain metastasis: XRT has been completed. 4.  Left femur lesion: XRT has been completed. 5.  Foot drop: Appreciate Occupational Therapy input.  Continue brace as prescribed. 6.  Hypotension: Resolved. 7.  Leukocytosis: Mild.  Monitor. 8.  Hypokalemia: Patient will receive 40 mEq IV potassium today and was also given a prescription for oral supplementation. 9.  Hypomagnesia: Patient will receive 6 g by infusion pump over 24 hours.  She was also given a prescription for oral supplementation. 10.  Weight loss: Patient is on Megace, but this does not seem to be working.  She was given a prescription for 4 mg dexamethasone daily and a referral to dietary.   Patient expressed understanding and was in agreement with this plan. She also understands that She can call clinic at any time with any questions, concerns, or complaints.   Cancer Staging Non-small cell carcinoma of lung, right [facility]) Staging form: Lung, AJCC 8th Edition - Clinical stage from 09/26/2019: Stage IVB (cT4, cN3, cM1c) - Signed by Lloyd Huger, MD on 09/26/2019   Lloyd Huger, MD   11/16/2019 10:26 AM

## 2019-11-13 NOTE — Progress Notes (Signed)
Agree with the details of the procedure as noted by Tammy Parrett, NP.  C. Laura Khaleem Burchill, MD [facility] 

## 2019-11-13 NOTE — Progress Notes (Signed)
Patient called for pre assessment. States in the last week she has cut back on smoking. States she is only smoking 1 and 1/2 cig a day. Reports feeling tired and sore throat since cutting back. Also reports visiting the Urgent Care last Saturday concerning her right elbow. Reports being prescribed antibiotics and area is scabbing over. Still reports no appetite and decadron is not helping. Also would like to discuss refill for hydrocodone for pain.

## 2019-11-16 ENCOUNTER — Inpatient Hospital Stay: Payer: Managed Care, Other (non HMO) | Attending: Oncology

## 2019-11-16 ENCOUNTER — Inpatient Hospital Stay: Payer: Managed Care, Other (non HMO)

## 2019-11-16 ENCOUNTER — Inpatient Hospital Stay (HOSPITAL_BASED_OUTPATIENT_CLINIC_OR_DEPARTMENT_OTHER): Payer: Managed Care, Other (non HMO) | Admitting: Hospice and Palliative Medicine

## 2019-11-16 ENCOUNTER — Inpatient Hospital Stay (HOSPITAL_BASED_OUTPATIENT_CLINIC_OR_DEPARTMENT_OTHER): Payer: Managed Care, Other (non HMO) | Admitting: Oncology

## 2019-11-16 ENCOUNTER — Telehealth: Payer: Self-pay | Admitting: *Deleted

## 2019-11-16 ENCOUNTER — Other Ambulatory Visit: Payer: Self-pay | Admitting: *Deleted

## 2019-11-16 ENCOUNTER — Other Ambulatory Visit: Payer: Self-pay

## 2019-11-16 VITALS — BP 135/99 | HR 118 | Temp 96.6°F | Resp 20 | Wt 131.5 lb

## 2019-11-16 DIAGNOSIS — C7971 Secondary malignant neoplasm of right adrenal gland: Secondary | ICD-10-CM | POA: Diagnosis not present

## 2019-11-16 DIAGNOSIS — C3411 Malignant neoplasm of upper lobe, right bronchus or lung: Secondary | ICD-10-CM | POA: Insufficient documentation

## 2019-11-16 DIAGNOSIS — C3491 Malignant neoplasm of unspecified part of right bronchus or lung: Secondary | ICD-10-CM

## 2019-11-16 DIAGNOSIS — C781 Secondary malignant neoplasm of mediastinum: Secondary | ICD-10-CM | POA: Diagnosis not present

## 2019-11-16 DIAGNOSIS — E876 Hypokalemia: Secondary | ICD-10-CM

## 2019-11-16 DIAGNOSIS — C7931 Secondary malignant neoplasm of brain: Secondary | ICD-10-CM | POA: Insufficient documentation

## 2019-11-16 DIAGNOSIS — Z5111 Encounter for antineoplastic chemotherapy: Secondary | ICD-10-CM | POA: Insufficient documentation

## 2019-11-16 DIAGNOSIS — C7972 Secondary malignant neoplasm of left adrenal gland: Secondary | ICD-10-CM | POA: Diagnosis not present

## 2019-11-16 DIAGNOSIS — D72829 Elevated white blood cell count, unspecified: Secondary | ICD-10-CM | POA: Diagnosis not present

## 2019-11-16 DIAGNOSIS — M21372 Foot drop, left foot: Secondary | ICD-10-CM | POA: Diagnosis not present

## 2019-11-16 DIAGNOSIS — Z79899 Other long term (current) drug therapy: Secondary | ICD-10-CM | POA: Insufficient documentation

## 2019-11-16 DIAGNOSIS — Z923 Personal history of irradiation: Secondary | ICD-10-CM | POA: Insufficient documentation

## 2019-11-16 DIAGNOSIS — Z515 Encounter for palliative care: Secondary | ICD-10-CM

## 2019-11-16 DIAGNOSIS — C7951 Secondary malignant neoplasm of bone: Secondary | ICD-10-CM | POA: Insufficient documentation

## 2019-11-16 LAB — CBC WITH DIFFERENTIAL/PLATELET
Abs Immature Granulocytes: 0.31 10*3/uL — ABNORMAL HIGH (ref 0.00–0.07)
Basophils Absolute: 0.2 10*3/uL — ABNORMAL HIGH (ref 0.0–0.1)
Basophils Relative: 1 %
Eosinophils Absolute: 0 10*3/uL (ref 0.0–0.5)
Eosinophils Relative: 0 %
HCT: 38.4 % (ref 36.0–46.0)
Hemoglobin: 13.4 g/dL (ref 12.0–15.0)
Immature Granulocytes: 2 %
Lymphocytes Relative: 10 %
Lymphs Abs: 1.5 10*3/uL (ref 0.7–4.0)
MCH: 30.8 pg (ref 26.0–34.0)
MCHC: 34.9 g/dL (ref 30.0–36.0)
MCV: 88.3 fL (ref 80.0–100.0)
Monocytes Absolute: 1.5 10*3/uL — ABNORMAL HIGH (ref 0.1–1.0)
Monocytes Relative: 10 %
Neutro Abs: 11.3 10*3/uL — ABNORMAL HIGH (ref 1.7–7.7)
Neutrophils Relative %: 77 %
Platelets: 596 10*3/uL — ABNORMAL HIGH (ref 150–400)
RBC: 4.35 MIL/uL (ref 3.87–5.11)
RDW: 14.4 % (ref 11.5–15.5)
WBC: 14.9 10*3/uL — ABNORMAL HIGH (ref 4.0–10.5)
nRBC: 0 % (ref 0.0–0.2)

## 2019-11-16 LAB — MAGNESIUM: Magnesium: 0.8 mg/dL — CL (ref 1.7–2.4)

## 2019-11-16 LAB — COMPREHENSIVE METABOLIC PANEL
ALT: 10 U/L (ref 0–44)
AST: 16 U/L (ref 15–41)
Albumin: 3.6 g/dL (ref 3.5–5.0)
Alkaline Phosphatase: 80 U/L (ref 38–126)
Anion gap: 18 — ABNORMAL HIGH (ref 5–15)
BUN: 21 mg/dL (ref 8–23)
CO2: 17 mmol/L — ABNORMAL LOW (ref 22–32)
Calcium: 8.4 mg/dL — ABNORMAL LOW (ref 8.9–10.3)
Chloride: 102 mmol/L (ref 98–111)
Creatinine, Ser: 0.64 mg/dL (ref 0.44–1.00)
GFR calc Af Amer: >60 mL/min (ref >60)
GFR calc non Af Amer: >60 mL/min (ref >60)
Glucose, Bld: 192 mg/dL — ABNORMAL HIGH (ref 70–99)
Potassium: 2.5 mmol/L — CL (ref 3.5–5.1)
Sodium: 137 mmol/L (ref 135–145)
Total Bilirubin: 1.2 mg/dL (ref 0.3–1.2)
Total Protein: 7.1 g/dL (ref 6.5–8.1)

## 2019-11-16 MED ORDER — POTASSIUM CHLORIDE 20 MEQ PO PACK: 20.0000 meq | PACK | Freq: Two times a day (BID) | ORAL | 2 refills | Status: DC

## 2019-11-16 MED ORDER — HYDROCODONE-ACETAMINOPHEN 5-325 MG PO TABS
1.0000 | ORAL_TABLET | Freq: Once | ORAL | Status: AC
  Administered 2019-11-16: 1 via ORAL
  Filled 2019-11-16: qty 1

## 2019-11-16 MED ORDER — HEPARIN SOD (PORK) LOCK FLUSH 100 UNIT/ML IV SOLN
INTRAVENOUS | Status: AC
  Filled 2019-11-16: qty 5

## 2019-11-16 MED ORDER — SODIUM CHLORIDE 0.9 % IV SOLN
6.0000 g | Freq: Once | INTRAVENOUS | Status: DC
  Administered 2019-11-16: 6 g via INTRAVENOUS
  Filled 2019-11-16: qty 12

## 2019-11-16 MED ORDER — SODIUM CHLORIDE 0.9 % IV SOLN
Freq: Once | INTRAVENOUS | Status: AC
  Filled 2019-11-16: qty 1000

## 2019-11-16 MED ORDER — SODIUM CHLORIDE 0.9% FLUSH
10.0000 mL | INTRAVENOUS | Status: DC | PRN
  Administered 2019-11-16: 10 mL via INTRAVENOUS
  Filled 2019-11-16: qty 10

## 2019-11-16 MED ORDER — HEPARIN SOD (PORK) LOCK FLUSH 100 UNIT/ML IV SOLN
500.0000 [IU] | Freq: Once | INTRAVENOUS | Status: DC
  Filled 2019-11-16: qty 5

## 2019-11-16 MED ORDER — HYDROCODONE-ACETAMINOPHEN 5-325 MG PO TABS
1.0000 | ORAL_TABLET | Freq: Four times a day (QID) | ORAL | 0 refills | Status: DC | PRN
Start: 2019-11-16 — End: 2019-11-30

## 2019-11-16 MED ORDER — DEXAMETHASONE 4 MG PO TABS: 4.0000 mg | ORAL_TABLET | Freq: Every day | ORAL | 1 refills | Status: AC

## 2019-11-16 NOTE — Progress Notes (Signed)
[facility]  [phone] [phone]   Name: ROBERTO HLAVATY Date: 11/16/2019 MRN: [id number]  DOB: [birth year]  Patient Care Team: Barbaraann Boys, MD as PCP - General (Pediatrics) Telford Nab, RN as Oncology Nurse Navigator Grayland Ormond, Kathlene November, MD as Consulting Physician (Oncology) Tyler Pita, MD as Consulting Physician (Pulmonary Disease)    REASON FOR CONSULTATION: Sandra Leonard is a 64 y.o. 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  Patient initially presented with left facial numbness.  Work-up including CT and MRI revealed a 3 cm right upper lobe lung lesion and at least 3 brain metastases.  PET/CT revealed hypermetabolic right upper lobe lung mass invading the mediastinum with associated right hilar, infrahilar, and subcarinal metastatic adenopathy.  PET also revealed hypermetabolic epicardial lymph nodes, bilateral adrenal gland metastasis, and bone metastasis in the left femur.  Patient is s/p XRT and on systemic chemotherapy.  She was referred to palliative care to address goals and manage ongoing symptoms.  SOCIAL HISTORY:     reports that she has been smoking. She has a 40.00 pack-year smoking history. She has never used smokeless tobacco. She reports that she does not drink alcohol and does not use drugs.  ADVANCE DIRECTIVES:  None on file  CODE STATUS:   PAST MEDICAL HISTORY: Past Medical History:  Diagnosis Date  . Adrenal gland anomaly    bilateral lesion  . Brain lesion    x3  . Diabetes ([place])   . Diabetes mellitus without complication ([place])   . GERD (gastroesophageal reflux disease)   . Heartburn   . Lesion of left femur   . Lung cancer ([place])     PAST SURGICAL HISTORY:  Past Surgical History:  Procedure Laterality Date  . ABDOMINAL HYSTERECTOMY    . CESAREAN SECTION     x2  . PORTA CATH INSERTION N/A 10/01/2019   Procedure:  PORTA CATH INSERTION;  Surgeon: Katha Cabal, MD;  Location: [facility];  Service: Cardiovascular;  Laterality: N/A;  . TONSILLECTOMY    . VIDEO BRONCHOSCOPY WITH ENDOBRONCHIAL ULTRASOUND N/A 09/21/2019   Procedure: VIDEO BRONCHOSCOPY WITH ENDOBRONCHIAL ULTRASOUND;  Surgeon: Tyler Pita, MD;  Location: [facility];  Service: Pulmonary;  Laterality: N/A;  FLURO ON STANDBY    HEMATOLOGY/ONCOLOGY HISTORY:  Oncology History  Non-small cell carcinoma of lung, right ([place])  09/26/2019 Initial Diagnosis   Non-small cell carcinoma of lung, right ([place])   09/26/2019 Cancer Staging   Staging form: Lung, AJCC 8th Edition - Clinical stage from 09/26/2019: Stage IVB (cT4, cN3, cM1c) - Signed by Lloyd Huger, MD on 09/26/2019   10/05/2019 -  Chemotherapy   The patient had palonosetron (ALOXI) injection 0.25 mg, 0.25 mg, Intravenous,  Once, 2 of 6 cycles Administration: 0.25 mg (10/05/2019), 0.25 mg (10/26/2019) pegfilgrastim-jmdb (FULPHILA) injection 6 mg, 6 mg, Subcutaneous,  Once, 2 of 6 cycles Administration: 6 mg (10/07/2019) CARBOplatin (PARAPLATIN) 520 mg in sodium chloride 0.9 % 250 mL chemo infusion, 520 mg (100 % of original dose 516 mg), Intravenous,  Once, 2 of 6 cycles Dose modification:   (original dose 516 mg, Cycle 1) Administration: 520 mg (10/05/2019), 520 mg (10/26/2019) fosaprepitant (EMEND) 150 mg in sodium chloride 0.9 % 145 mL IVPB, 150 mg, Intravenous,  Once, 2 of 6 cycles Administration: 150 mg (10/05/2019), 150 mg (10/26/2019) PACLitaxel (TAXOL) 390 mg in sodium chloride 0.9 % 500 mL chemo infusion (>  80mg /m2), 225 mg/m2 = 390 mg, Intravenous,  Once, 2 of 6 cycles Administration: 390 mg (10/05/2019), 390 mg (10/26/2019)  for chemotherapy treatment.      ALLERGIES:  is allergic to codeine, penicillins, and sulfa antibiotics.  MEDICATIONS:  Current Outpatient Medications  Medication Sig Dispense Refill  . atorvastatin (LIPITOR) 10 MG tablet Take 10 mg by mouth  daily.    Marland Kitchen dexamethasone (DECADRON) 4 MG tablet Take 1 tablet (4 mg total) by mouth daily. 30 tablet 1  . diphenhydrAMINE (BENADRYL ALLERGY) 25 MG tablet Take 25 mg by mouth at bedtime as needed for sleep.     Marland Kitchen doxycycline (VIBRAMYCIN) 100 MG capsule Take 100 mg by mouth 2 (two) times daily.    . empagliflozin (JARDIANCE) 10 MG TABS tablet Take 10 mg by mouth at bedtime.     Marland Kitchen esomeprazole (NEXIUM) 40 MG capsule Take 40 mg by mouth daily.    Marland Kitchen HYDROcodone-acetaminophen (NORCO) 5-325 MG tablet Take 1 tablet by mouth every 6 (six) hours as needed for moderate pain. 30 tablet 0  . lidocaine-prilocaine (EMLA) cream Apply to affected area once 30 g 3  . lisinopril (ZESTRIL) 5 MG tablet Take 5 mg by mouth daily.     . magic mouthwash w/lidocaine SOLN Take 5 mLs by mouth 4 (four) times daily. 240 mL 0  . megestrol (MEGACE) 40 MG tablet Take 1 tablet (40 mg total) by mouth daily. 30 tablet 2  . metFORMIN (GLUCOPHAGE) 1000 MG tablet Take 1 tablet (1,000 mg total) by mouth 2 (two) times daily with a meal. 180 tablet 3  . nicotine (NICODERM CQ - DOSED IN MG/24 HOURS) 21 mg/24hr patch Place onto the skin.    . nicotine (NICOTROL) 10 MG inhaler Inhale 1 Cartridge (1 continuous puffing total) into the lungs as needed for smoking cessation. 42 each 0  . omeprazole (PRILOSEC) 20 MG capsule TAKE 1 CAPSULE (20 MG TOTAL) BY MOUTH DAILY. 30 capsule 6  . ondansetron (ZOFRAN) 8 MG tablet Take 1 tablet (8 mg total) by mouth 2 (two) times daily as needed for refractory nausea / vomiting. 60 tablet 2  . ONE TOUCH ULTRA TEST test strip TEST BLOOD SUGAR IN THE MORNING AND 2 HOURS AFTER LARGEST MEAL 50 each 2  . potassium chloride (KLOR-CON) 20 MEQ packet Take 20 mEq by mouth 2 (two) times daily. 30 packet 2  . prochlorperazine (COMPAZINE) 10 MG tablet Take 1 tablet (10 mg total) by mouth every 6 (six) hours as needed (Nausea or vomiting). 60 tablet 2  . sertraline (ZOLOFT) 50 MG tablet Take half a tablet (25mg ) by mouth  daily x 1 week and then increase to one tablet (50mg ) daily (Patient taking differently: Take 50 mg by mouth daily. ) 30 tablet 2  . traZODone (DESYREL) 100 MG tablet Take 1-2 tablets (100-200 mg total) by mouth at bedtime as needed for sleep. 60 tablet 2   No current facility-administered medications for this visit.   Facility-Administered Medications Ordered in Other Visits  Medication Dose Route Frequency Provider Last Rate Last Admin  . heparin lock flush 100 unit/mL  500 Units Intravenous Once Lloyd Huger, MD      . sodium chloride 0.9 % 1,000 mL with potassium chloride 40 mEq infusion   Intravenous Once Lloyd Huger, MD 500 mL/hr at 11/16/19 1001 New Bag at 11/16/19 1001  . sodium chloride flush (NS) 0.9 % injection 10 mL  10 mL Intravenous PRN Lloyd Huger, MD  10 mL at 11/16/19 0820    VITAL SIGNS: There were no vitals taken for this visit. There were no vitals filed for this visit.  Estimated body mass index is 24.05 kg/m as calculated from the following:   Height as of 11/07/19: 5\' 2"  (1.575 m).   Weight as of an earlier encounter on 11/16/19: 131 lb 8 oz (59.6 kg).  LABS: CBC:    Component Value Date/Time   WBC 14.9 (H) 11/16/2019 0820   HGB 13.4 11/16/2019 0820   HGB 16.3 (H) 02/28/2017 0832   HCT 38.4 11/16/2019 0820   HCT 47.7 (H) 02/28/2017 0832   PLT 596 (H) 11/16/2019 0820   PLT 337 02/28/2017 0832   MCV 88.3 11/16/2019 0820   MCV 91 02/28/2017 0832   NEUTROABS 11.3 (H) 11/16/2019 0820   NEUTROABS 7.9 (H) 02/28/2017 0832   LYMPHSABS 1.5 11/16/2019 0820   LYMPHSABS 3.5 (H) 02/28/2017 0832   MONOABS 1.5 (H) 11/16/2019 0820   EOSABS 0.0 11/16/2019 0820   EOSABS 0.1 02/28/2017 0832   BASOSABS 0.2 (H) 11/16/2019 0820   BASOSABS 0.1 02/28/2017 0832   Comprehensive Metabolic Panel:    Component Value Date/Time   NA 137 11/16/2019 0820   NA 143 10/09/2017 0810   K 2.5 (LL) 11/16/2019 0820   CL 102 11/16/2019 0820   CO2 17 (L) 11/16/2019  0820   BUN 21 11/16/2019 0820   BUN 15 10/09/2017 0810   CREATININE 0.64 11/16/2019 0820   GLUCOSE 192 (H) 11/16/2019 0820   CALCIUM 8.4 (L) 11/16/2019 0820   AST 16 11/16/2019 0820   ALT 10 11/16/2019 0820   ALKPHOS 80 11/16/2019 0820   BILITOT 1.2 11/16/2019 0820   BILITOT 0.3 10/09/2017 0810   PROT 7.1 11/16/2019 0820   PROT 6.7 10/09/2017 0810   ALBUMIN 3.6 11/16/2019 0820   ALBUMIN 4.2 10/09/2017 0810    RADIOGRAPHIC STUDIES: DG Elbow Complete Right  Result Date: 11/07/2019 CLINICAL DATA:  Soft tissue infection at the level of the olecranon EXAM: RIGHT ELBOW - COMPLETE 3+ VIEW COMPARISON:  None. FINDINGS: Frontal, lateral, and bilateral oblique views were obtained. No fracture or dislocation. No joint effusion. No appreciable joint space narrowing or erosion. There is soft tissue prominence at the level of the olecranon process of the proximal ulna without associated soft tissue air. No erosions. IMPRESSION: Soft tissue prominence in the region of the olecranon. This area of soft tissue prominence measures 2.3 x 1.0 cm. No associated air. This finding could represent olecranon bursitis or potential infectious lesion. Well-defined abscess by radiography not confirmed. No associated bony erosion or bony destruction. No fracture or dislocation. No appreciable joint space narrowing or joint effusion. Electronically Signed   By: Lowella Grip III M.D.   On: 11/07/2019 09:05    PERFORMANCE STATUS (ECOG) : 2 - Symptomatic, <50% confined to bed  Review of Systems Unless otherwise noted, a complete review of systems is negative.  Physical Exam General: NAD Pulmonary: Unlabored Extremities: no edema, no joint deformities Skin: no rashes Neurological: Weakness but otherwise nonfocal  IMPRESSION: Patient seen in infusion.  Treatment is being held this week due to progressive fatigue.  Patient reports that she is sleeping much of the day and night.  She feels fatigued with little  energy.  She also says her appetite has diminished.  Patient is being restarted on dexamethasone by Dr. Grayland Ormond.  Hopefully, steroids will help improve appetite and energy.  Patient denies any recurrent falls.  She has held  PT due to fatigue but feels like her legs are stronger than they were.  She continues to have occasional knee pain but overall says it is improved.  Patient says that she is using the Norco about once a day for breakthrough pain.  This was refilled earlier today by Dr. Grayland Ormond. PDMP reviewed.   Introduced ACP documents and MOST form.  Patient took this home to discuss with her family.  However, she reiterated desire to continue "fighting."  PLAN: -Continue to follow -Continue as needed Norco for pain -Continue Zoloft 50 mg daily and trazodone nightly as needed -ACP and MOST form introduced -MyChart visit with me in about a month   Patient expressed understanding and was in agreement with this plan. She also understands that She can call the clinic at any time with any questions, concerns, or complaints.     Time Total: 15 minutes  Visit consisted of counseling and education dealing with the complex and emotionally intense issues of symptom management and palliative care in the setting of serious and potentially life-threatening illness.Greater than 50%  of this time was spent counseling and coordinating care related to the above assessment and plan.  Signed by: Altha Harm, PhD, NP-C

## 2019-11-16 NOTE — Telephone Encounter (Signed)
Left detailed vm for Sandra Leonard, case manager with [facility] regarding patients appointments for fulphilia. Patient does not need her injection at home this week, chemo was held. Patient will need appointment for injection next Wednesday at home as she will return on 9/27 for chemotherapy.

## 2019-11-17 ENCOUNTER — Inpatient Hospital Stay: Payer: Managed Care, Other (non HMO)

## 2019-11-17 ENCOUNTER — Ambulatory Visit: Payer: Managed Care, Other (non HMO) | Admitting: Physical Therapy

## 2019-11-17 ENCOUNTER — Other Ambulatory Visit: Payer: Self-pay

## 2019-11-17 VITALS — BP 151/71 | HR 98 | Temp 96.5°F | Resp 20

## 2019-11-17 DIAGNOSIS — C3411 Malignant neoplasm of upper lobe, right bronchus or lung: Secondary | ICD-10-CM | POA: Diagnosis not present

## 2019-11-17 DIAGNOSIS — C3491 Malignant neoplasm of unspecified part of right bronchus or lung: Secondary | ICD-10-CM

## 2019-11-17 MED ORDER — HEPARIN SOD (PORK) LOCK FLUSH 100 UNIT/ML IV SOLN
INTRAVENOUS | Status: AC
  Filled 2019-11-17: qty 5

## 2019-11-17 MED ORDER — HEPARIN SOD (PORK) LOCK FLUSH 100 UNIT/ML IV SOLN
500.0000 [IU] | Freq: Once | INTRAVENOUS | Status: AC | PRN
  Administered 2019-11-17: 500 [IU]
  Filled 2019-11-17: qty 5

## 2019-11-17 MED ORDER — SODIUM CHLORIDE 0.9% FLUSH
10.0000 mL | Freq: Once | INTRAVENOUS | Status: AC | PRN
  Administered 2019-11-17: 10 mL
  Filled 2019-11-17: qty 10

## 2019-11-18 ENCOUNTER — Telehealth: Payer: Self-pay | Admitting: *Deleted

## 2019-11-18 MED ORDER — POTASSIUM CHLORIDE CRYS ER 20 MEQ PO TBCR: 20.0000 meq | EXTENDED_RELEASE_TABLET | Freq: Two times a day (BID) | ORAL | 1 refills | Status: DC

## 2019-11-18 NOTE — Telephone Encounter (Signed)
Patient called requesting tablets be sent instead of powder, she cannot tolerate taste or consistency of powder. New RX sent.

## 2019-11-19 ENCOUNTER — Ambulatory Visit: Payer: Managed Care, Other (non HMO) | Admitting: Physical Therapy

## 2019-11-20 ENCOUNTER — Other Ambulatory Visit: Payer: Self-pay | Admitting: Oncology

## 2019-11-20 ENCOUNTER — Encounter: Payer: Self-pay | Admitting: Oncology

## 2019-11-20 NOTE — Progress Notes (Signed)
[place]  [phone] [phone]) [phone]  ID: Sandra Leonard OB: 01/19/1956  MR#: [id number]  MAY#:[id number]  Patient Care Team: Barbaraann Boys, MD as PCP - General (Pediatrics) Telford Nab, RN as Oncology Nurse Navigator Grayland Ormond, Kathlene November, MD as Consulting Physician (Oncology) Tyler Pita, MD as Consulting Physician (Pulmonary Disease)   CHIEF COMPLAINT: Stage IVb non-small cell lung cancer, NOS.  INTERVAL HISTORY: Patient returns to clinic today for further evaluation and reconsideration of cycle 3 of carboplatinum and Taxol.  Her appetite has improved with dexamethasone and her weight is now stable.  She has mild dizziness and continued weakness and fatigue. She continues to have left foot drop which is helped by her brace.  She has no other neurologic complaints.  She denies any recent fevers or illnesses.  She has no chest pain, shortness of breath, cough, or hemoptysis.  She denies any nausea, vomiting, constipation, or diarrhea.  She has no melena or hematochezia.  She has no urinary complaints.  Patient offers no further specific complaints today.  REVIEW OF SYSTEMS:   Review of Systems  Constitutional: Positive for malaise/fatigue. Negative for fever and weight loss.  Respiratory: Negative.  Negative for cough, hemoptysis and shortness of breath.   Cardiovascular: Negative.  Negative for chest pain and leg swelling.  Gastrointestinal: Negative.  Negative for abdominal pain.  Genitourinary: Negative.  Negative for dysuria.  Musculoskeletal: Negative.  Negative for back pain.  Skin: Negative.  Negative for rash.  Neurological: Positive for dizziness, focal weakness and weakness. Negative for tingling, speech change and headaches.  Psychiatric/Behavioral: Negative.  The patient is not nervous/anxious.     As per HPI. Otherwise, a complete review of systems is negative.  PAST MEDICAL HISTORY: Past Medical History:  Diagnosis Date  .  Adrenal gland anomaly    bilateral lesion  . Brain lesion    x3  . Diabetes ([place])   . Diabetes mellitus without complication ([place])   . GERD (gastroesophageal reflux disease)   . Heartburn   . Lesion of left femur   . Lung cancer ([place])     PAST SURGICAL HISTORY: Past Surgical History:  Procedure Laterality Date  . ABDOMINAL HYSTERECTOMY    . CESAREAN SECTION     x2  . PORTA CATH INSERTION N/A 10/01/2019   Procedure: PORTA CATH INSERTION;  Surgeon: Katha Cabal, MD;  Location: [facility];  Service: Cardiovascular;  Laterality: N/A;  . TONSILLECTOMY    . VIDEO BRONCHOSCOPY WITH ENDOBRONCHIAL ULTRASOUND N/A 09/21/2019   Procedure: VIDEO BRONCHOSCOPY WITH ENDOBRONCHIAL ULTRASOUND;  Surgeon: Tyler Pita, MD;  Location: [facility];  Service: Pulmonary;  Laterality: N/A;  FLURO ON STANDBY    FAMILY HISTORY: Family History  Problem Relation Age of Onset  . Breast cancer Mother 105  . Diabetes Mother   . Hypertension Mother   . Hypertension Father   . Diabetes Sister   . Hypertension Sister   . Diabetes Brother     ADVANCED DIRECTIVES (Y/N):  N  HEALTH MAINTENANCE: Social History   Tobacco Use  . Smoking status: Current Every Day Smoker    Packs/day: 1.00    Years: 40.00    Pack years: 40.00  . Smokeless tobacco: Never Used  . Tobacco comment: 7-8 cigarettes a day--10/13/2019  Vaping Use  . Vaping Use: Never used  Substance Use Topics  . Alcohol use: No    Alcohol/week: 0.0 standard drinks  . Drug use: No  Colonoscopy:  PAP:  Bone density:  Lipid panel:  Allergies  Allergen Reactions  . Codeine     "makes her crazy"  . Penicillins Rash  . Sulfa Antibiotics Rash    Current Outpatient Medications  Medication Sig Dispense Refill  . atorvastatin (LIPITOR) 10 MG tablet Take 10 mg by mouth daily.    Marland Kitchen dexamethasone (DECADRON) 4 MG tablet Take 1 tablet (4 mg total) by mouth daily. 30 tablet 1  . diphenhydrAMINE (BENADRYL ALLERGY) 25 MG  tablet Take 25 mg by mouth at bedtime as needed for sleep.     Marland Kitchen doxycycline (VIBRAMYCIN) 100 MG capsule Take 100 mg by mouth 2 (two) times daily.    . empagliflozin (JARDIANCE) 10 MG TABS tablet Take 10 mg by mouth at bedtime.     Marland Kitchen esomeprazole (NEXIUM) 40 MG capsule Take 40 mg by mouth daily.    Marland Kitchen HYDROcodone-acetaminophen (NORCO) 5-325 MG tablet Take 1 tablet by mouth every 6 (six) hours as needed for moderate pain. 30 tablet 0  . lidocaine-prilocaine (EMLA) cream Apply to affected area once 30 g 3  . lisinopril (ZESTRIL) 5 MG tablet Take 5 mg by mouth daily.     . magic mouthwash w/lidocaine SOLN Take 5 mLs by mouth 4 (four) times daily. 240 mL 0  . megestrol (MEGACE) 40 MG tablet Take 1 tablet (40 mg total) by mouth daily. 30 tablet 2  . metFORMIN (GLUCOPHAGE) 1000 MG tablet Take 1 tablet (1,000 mg total) by mouth 2 (two) times daily with a meal. 180 tablet 3  . nicotine (NICODERM CQ - DOSED IN MG/24 HOURS) 21 mg/24hr patch Place onto the skin.    . nicotine (NICOTROL) 10 MG inhaler Inhale 1 Cartridge (1 continuous puffing total) into the lungs as needed for smoking cessation. 42 each 0  . omeprazole (PRILOSEC) 20 MG capsule TAKE 1 CAPSULE (20 MG TOTAL) BY MOUTH DAILY. 30 capsule 6  . ondansetron (ZOFRAN) 8 MG tablet Take 1 tablet (8 mg total) by mouth 2 (two) times daily as needed for refractory nausea / vomiting. 60 tablet 2  . ONE TOUCH ULTRA TEST test strip TEST BLOOD SUGAR IN THE MORNING AND 2 HOURS AFTER LARGEST MEAL 50 each 2  . potassium chloride SA (KLOR-CON) 20 MEQ tablet Take 1 tablet (20 mEq total) by mouth 2 (two) times daily. 60 tablet 1  . prochlorperazine (COMPAZINE) 10 MG tablet Take 1 tablet (10 mg total) by mouth every 6 (six) hours as needed (Nausea or vomiting). 60 tablet 2  . sertraline (ZOLOFT) 50 MG tablet Take half a tablet (63m) by mouth daily x 1 week and then increase to one tablet (569m daily 30 tablet 2  . traZODone (DESYREL) 100 MG tablet Take 1-2 tablets  (100-200 mg total) by mouth at bedtime as needed for sleep. 60 tablet 2   No current facility-administered medications for this visit.   Facility-Administered Medications Ordered in Other Visits  Medication Dose Route Frequency Provider Last Rate Last Admin  . CARBOplatin (PARAPLATIN) 470 mg in sodium chloride 0.9 % 250 mL chemo infusion  470 mg Intravenous Once FiLloyd HugerMD      . heparin lock flush 100 unit/mL  500 Units Intracatheter Once PRN FiLloyd HugerMD      . PACLitaxel (TAXOL) 366 mg in sodium chloride 0.9 % 500 mL chemo infusion (> 8034m2)  225 mg/m2 (Order-Specific) Intravenous Once FinLloyd HugerD 187 mL/hr at 11/23/19 1256 Rate Verify at 11/23/19 1256  OBJECTIVE: Vitals:   11/23/19 0841  BP: 110/68  Pulse: (!) 112  Resp: 20  Temp: 97.6 F (36.4 C)  SpO2: 100%     Body mass index is 24.07 kg/m.    ECOG FS:0 - Asymptomatic  General: Well-developed, well-nourished, no acute distress.  Sitting in a wheelchair. Eyes: Pink conjunctiva, anicteric sclera. HEENT: Normocephalic, moist mucous membranes. Lungs: No audible wheezing or coughing. Heart: Regular rate and rhythm. Abdomen: Soft, nontender, no obvious distention. Musculoskeletal: No edema, cyanosis, or clubbing. Neuro: Alert, answering all questions appropriately. Cranial nerves grossly intact. Skin: No rashes or petechiae noted. Psych: Normal affect.   LAB RESULTS:  Lab Results  Component Value Date   NA 133 (L) 11/23/2019   K 4.1 11/23/2019   CL 103 11/23/2019   CO2 15 (L) 11/23/2019   GLUCOSE 161 (H) 11/23/2019   BUN 25 (H) 11/23/2019   CREATININE 0.77 11/23/2019   CALCIUM 8.8 (L) 11/23/2019   PROT 6.3 (L) 11/23/2019   ALBUMIN 3.6 11/23/2019   AST 15 11/23/2019   ALT 13 11/23/2019   ALKPHOS 66 11/23/2019   BILITOT 1.1 11/23/2019   GFRNONAA >60 11/23/2019   GFRAA >60 11/23/2019    Lab Results  Component Value Date   WBC 20.9 (H) 11/23/2019   NEUTROABS 17.0 (H)  11/23/2019   HGB 13.3 11/23/2019   HCT 39.3 11/23/2019   MCV 91.6 11/23/2019   PLT 491 (H) 11/23/2019     STUDIES: DG Elbow Complete Right  Result Date: 11/07/2019 CLINICAL DATA:  Soft tissue infection at the level of the olecranon EXAM: RIGHT ELBOW - COMPLETE 3+ VIEW COMPARISON:  None. FINDINGS: Frontal, lateral, and bilateral oblique views were obtained. No fracture or dislocation. No joint effusion. No appreciable joint space narrowing or erosion. There is soft tissue prominence at the level of the olecranon process of the proximal ulna without associated soft tissue air. No erosions. IMPRESSION: Soft tissue prominence in the region of the olecranon. This area of soft tissue prominence measures 2.3 x 1.0 cm. No associated air. This finding could represent olecranon bursitis or potential infectious lesion. Well-defined abscess by radiography not confirmed. No associated bony erosion or bony destruction. No fracture or dislocation. No appreciable joint space narrowing or joint effusion. Electronically Signed   By: Lowella Grip III M.D.   On: 11/07/2019 09:05    ASSESSMENT: Stage IVb non-small cell lung cancer, NOS. No actionable mutations, PD-L1 0%.  PLAN:    1. Stage IVb non-small cell lung cancer, NOS: Biopsy consistent with non-small cell carcinoma of lung.  Liquid biopsy results as above.  PET scan results from September 07, 2019 reviewed independently with right upper lobe lung lesion invading the mediastinum, small bilateral pulmonary nodules, and bilateral adrenal metastasis.  Patient also has cortical bone metastasis in left femur.  [facility].  Patient has now completed XRT.  Since malignancy cannot be further specified other than non-small cell carcinoma, patient will receive carboplatinum and Taxol with Fulphilia support every 3 weeks for 4 cycles and then reimage.  Proceed with cycle 4 of treatment today.  Return to clinic biweekly for IV fluids and then  in 3 weeks for further evaluation and consideration of cycle 4.  Of note, patient does her Fulphilia shots at home 24 to 48 hours after treatment.  2.  Facial numbness: Improving.  Secondary to brain metastasis.  XRT has been completed. 3.  Brain metastasis: XRT has been completed. 4.  Left femur  lesion: XRT has been completed. 5.  Foot drop: Appreciate Occupational Therapy input.  Continue brace as prescribed. 6.  Hypotension/dizziness: IV fluids as above. 7.  Leukocytosis: Secondary to dexamethasone use.  Monitor. 8.  Hypokalemia: Resolved.  Continue oral supplementation. 9.  Hypomagnesia: Improving.  Continue oral supplementation.  Patient also received 2 g IV magnesium today. 10.  Weight loss: Stable.  Continue 4 mg dexamethasone daily and follow-up with dietary as indicated.   Patient expressed understanding and was in agreement with this plan. She also understands that She can call clinic at any time with any questions, concerns, or complaints.   Cancer Staging Non-small cell carcinoma of lung, right [facility]) Staging form: Lung, AJCC 8th Edition - Clinical stage from 09/26/2019: Stage IVB (cT4, cN3, cM1c) - Signed by Lloyd Huger, MD on 09/26/2019   Lloyd Huger, MD   11/23/2019 1:17 PM

## 2019-11-20 NOTE — Progress Notes (Signed)
Patient states today at appointment she is feel much weaker than last week, she still has no appetite, experiencing some dizziness, and more shortness of breath. She denies any pain today.

## 2019-11-23 ENCOUNTER — Other Ambulatory Visit: Payer: Self-pay

## 2019-11-23 ENCOUNTER — Inpatient Hospital Stay (HOSPITAL_BASED_OUTPATIENT_CLINIC_OR_DEPARTMENT_OTHER): Payer: Managed Care, Other (non HMO) | Admitting: Oncology

## 2019-11-23 ENCOUNTER — Encounter: Payer: Self-pay | Admitting: Oncology

## 2019-11-23 ENCOUNTER — Inpatient Hospital Stay: Payer: Managed Care, Other (non HMO)

## 2019-11-23 VITALS — BP 110/68 | HR 112 | Temp 97.6°F | Resp 20 | Ht 62.0 in | Wt 131.6 lb

## 2019-11-23 VITALS — BP 105/65 | HR 97 | Resp 18

## 2019-11-23 DIAGNOSIS — C3491 Malignant neoplasm of unspecified part of right bronchus or lung: Secondary | ICD-10-CM

## 2019-11-23 DIAGNOSIS — C3411 Malignant neoplasm of upper lobe, right bronchus or lung: Secondary | ICD-10-CM | POA: Diagnosis not present

## 2019-11-23 LAB — CBC WITH DIFFERENTIAL/PLATELET
Abs Immature Granulocytes: 0.34 10*3/uL — ABNORMAL HIGH (ref 0.00–0.07)
Basophils Absolute: 0.1 10*3/uL (ref 0.0–0.1)
Basophils Relative: 0 %
Eosinophils Absolute: 0.1 10*3/uL (ref 0.0–0.5)
Eosinophils Relative: 0 %
HCT: 39.3 % (ref 36.0–46.0)
Hemoglobin: 13.3 g/dL (ref 12.0–15.0)
Immature Granulocytes: 2 %
Lymphocytes Relative: 9 %
Lymphs Abs: 1.8 10*3/uL (ref 0.7–4.0)
MCH: 31 pg (ref 26.0–34.0)
MCHC: 33.8 g/dL (ref 30.0–36.0)
MCV: 91.6 fL (ref 80.0–100.0)
Monocytes Absolute: 1.7 10*3/uL — ABNORMAL HIGH (ref 0.1–1.0)
Monocytes Relative: 8 %
Neutro Abs: 17 10*3/uL — ABNORMAL HIGH (ref 1.7–7.7)
Neutrophils Relative %: 81 %
Platelets: 491 10*3/uL — ABNORMAL HIGH (ref 150–400)
RBC: 4.29 MIL/uL (ref 3.87–5.11)
RDW: 14.8 % (ref 11.5–15.5)
WBC: 20.9 10*3/uL — ABNORMAL HIGH (ref 4.0–10.5)
nRBC: 0 % (ref 0.0–0.2)

## 2019-11-23 LAB — COMPREHENSIVE METABOLIC PANEL
ALT: 13 U/L (ref 0–44)
AST: 15 U/L (ref 15–41)
Albumin: 3.6 g/dL (ref 3.5–5.0)
Alkaline Phosphatase: 66 U/L (ref 38–126)
Anion gap: 15 (ref 5–15)
BUN: 25 mg/dL — ABNORMAL HIGH (ref 8–23)
CO2: 15 mmol/L — ABNORMAL LOW (ref 22–32)
Calcium: 8.8 mg/dL — ABNORMAL LOW (ref 8.9–10.3)
Chloride: 103 mmol/L (ref 98–111)
Creatinine, Ser: 0.77 mg/dL (ref 0.44–1.00)
GFR calc Af Amer: >60 mL/min (ref >60)
GFR calc non Af Amer: >60 mL/min (ref >60)
Glucose, Bld: 161 mg/dL — ABNORMAL HIGH (ref 70–99)
Potassium: 4.1 mmol/L (ref 3.5–5.1)
Sodium: 133 mmol/L — ABNORMAL LOW (ref 135–145)
Total Bilirubin: 1.1 mg/dL (ref 0.3–1.2)
Total Protein: 6.3 g/dL — ABNORMAL LOW (ref 6.5–8.1)

## 2019-11-23 LAB — MAGNESIUM: Magnesium: 1.3 mg/dL — ABNORMAL LOW (ref 1.7–2.4)

## 2019-11-23 MED ORDER — SODIUM CHLORIDE 0.9 % IV SOLN
Freq: Once | INTRAVENOUS | Status: AC
  Filled 2019-11-23: qty 250

## 2019-11-23 MED ORDER — HEPARIN SOD (PORK) LOCK FLUSH 100 UNIT/ML IV SOLN
500.0000 [IU] | Freq: Once | INTRAVENOUS | Status: AC | PRN
  Administered 2019-11-23: 500 [IU]
  Filled 2019-11-23: qty 5

## 2019-11-23 MED ORDER — SODIUM CHLORIDE 0.9 % IV SOLN
10.0000 mg | Freq: Once | INTRAVENOUS | Status: AC
  Administered 2019-11-23: 10 mg via INTRAVENOUS
  Filled 2019-11-23: qty 10

## 2019-11-23 MED ORDER — PALONOSETRON HCL INJECTION 0.25 MG/5ML
0.2500 mg | Freq: Once | INTRAVENOUS | Status: AC
  Administered 2019-11-23: 0.25 mg via INTRAVENOUS
  Filled 2019-11-23: qty 5

## 2019-11-23 MED ORDER — SODIUM CHLORIDE 0.9 % IV SOLN
468.5000 mg | Freq: Once | INTRAVENOUS | Status: AC
  Administered 2019-11-23: 470 mg via INTRAVENOUS
  Filled 2019-11-23: qty 47

## 2019-11-23 MED ORDER — HEPARIN SOD (PORK) LOCK FLUSH 100 UNIT/ML IV SOLN
INTRAVENOUS | Status: AC
  Filled 2019-11-23: qty 5

## 2019-11-23 MED ORDER — SODIUM CHLORIDE 0.9 % IV SOLN
225.0000 mg/m2 | Freq: Once | INTRAVENOUS | Status: AC
  Administered 2019-11-23: 366 mg via INTRAVENOUS
  Filled 2019-11-23: qty 61

## 2019-11-23 MED ORDER — SODIUM CHLORIDE 0.9 % IV SOLN: 225.0000 mg/m2 | Freq: Once | INTRAVENOUS | Status: DC

## 2019-11-23 MED ORDER — SODIUM CHLORIDE 0.9 % IV SOLN: 516.0000 mg | Freq: Once | INTRAVENOUS | Status: DC

## 2019-11-23 MED ORDER — MAGNESIUM SULFATE 2 GM/50ML IV SOLN
2.0000 g | Freq: Once | INTRAVENOUS | Status: AC
  Administered 2019-11-23: 2 g via INTRAVENOUS
  Filled 2019-11-23: qty 50

## 2019-11-23 MED ORDER — SODIUM CHLORIDE 0.9 % IV SOLN
150.0000 mg | Freq: Once | INTRAVENOUS | Status: AC
  Administered 2019-11-23: 150 mg via INTRAVENOUS
  Filled 2019-11-23: qty 150

## 2019-11-23 MED ORDER — DIPHENHYDRAMINE HCL 50 MG/ML IJ SOLN
25.0000 mg | Freq: Once | INTRAMUSCULAR | Status: AC
  Administered 2019-11-23: 25 mg via INTRAVENOUS
  Filled 2019-11-23: qty 1

## 2019-11-23 MED ORDER — FAMOTIDINE IN NACL 20-0.9 MG/50ML-% IV SOLN
20.0000 mg | Freq: Once | INTRAVENOUS | Status: AC
  Administered 2019-11-23: 20 mg via INTRAVENOUS
  Filled 2019-11-23: qty 50

## 2019-11-23 NOTE — Progress Notes (Signed)
Pulse Rate: 102. MD, Dr. Grayland Ormond, notified and aware. Per MD order: proceed with scheduled Taxol and Carboplatin treatment today.

## 2019-11-24 ENCOUNTER — Ambulatory Visit: Payer: Managed Care, Other (non HMO) | Admitting: Physical Therapy

## 2019-11-24 ENCOUNTER — Emergency Department: Payer: Managed Care, Other (non HMO)

## 2019-11-24 ENCOUNTER — Encounter: Payer: Self-pay | Admitting: Emergency Medicine

## 2019-11-24 ENCOUNTER — Other Ambulatory Visit: Payer: Self-pay | Admitting: Oncology

## 2019-11-24 ENCOUNTER — Emergency Department
Admission: EM | Admit: 2019-11-24 | Discharge: 2019-11-24 | Disposition: A | Discharge status: Home / Self Care | Payer: Managed Care, Other (non HMO) | Attending: Emergency Medicine | Admitting: Emergency Medicine

## 2019-11-24 ENCOUNTER — Telehealth: Payer: Self-pay | Admitting: *Deleted

## 2019-11-24 DIAGNOSIS — R079 Chest pain, unspecified: Secondary | ICD-10-CM | POA: Insufficient documentation

## 2019-11-24 DIAGNOSIS — R0602 Shortness of breath: Secondary | ICD-10-CM | POA: Insufficient documentation

## 2019-11-24 DIAGNOSIS — E119 Type 2 diabetes mellitus without complications: Secondary | ICD-10-CM | POA: Insufficient documentation

## 2019-11-24 DIAGNOSIS — Z79899 Other long term (current) drug therapy: Secondary | ICD-10-CM | POA: Insufficient documentation

## 2019-11-24 DIAGNOSIS — Z7984 Long term (current) use of oral hypoglycemic drugs: Secondary | ICD-10-CM | POA: Diagnosis not present

## 2019-11-24 DIAGNOSIS — C3491 Malignant neoplasm of unspecified part of right bronchus or lung: Secondary | ICD-10-CM | POA: Diagnosis not present

## 2019-11-24 DIAGNOSIS — R05 Cough: Secondary | ICD-10-CM | POA: Insufficient documentation

## 2019-11-24 DIAGNOSIS — F17210 Nicotine dependence, cigarettes, uncomplicated: Secondary | ICD-10-CM | POA: Insufficient documentation

## 2019-11-24 LAB — CBC
HCT: 39.7 % (ref 36.0–46.0)
Hemoglobin: 14.1 g/dL (ref 12.0–15.0)
MCH: 31.8 pg (ref 26.0–34.0)
MCHC: 35.5 g/dL (ref 30.0–36.0)
MCV: 89.6 fL (ref 80.0–100.0)
Platelets: 400 10*3/uL (ref 150–400)
RBC: 4.43 MIL/uL (ref 3.87–5.11)
RDW: 15 % (ref 11.5–15.5)
WBC: 24.5 10*3/uL — ABNORMAL HIGH (ref 4.0–10.5)
nRBC: 0 % (ref 0.0–0.2)

## 2019-11-24 LAB — BASIC METABOLIC PANEL
Anion gap: 13 (ref 5–15)
BUN: 25 mg/dL — ABNORMAL HIGH (ref 8–23)
CO2: 16 mmol/L — ABNORMAL LOW (ref 22–32)
Calcium: 8.4 mg/dL — ABNORMAL LOW (ref 8.9–10.3)
Chloride: 102 mmol/L (ref 98–111)
Creatinine, Ser: 0.45 mg/dL (ref 0.44–1.00)
GFR calc Af Amer: >60 mL/min (ref >60)
GFR calc non Af Amer: >60 mL/min (ref >60)
Glucose, Bld: 164 mg/dL — ABNORMAL HIGH (ref 70–99)
Potassium: 3.9 mmol/L (ref 3.5–5.1)
Sodium: 131 mmol/L — ABNORMAL LOW (ref 135–145)

## 2019-11-24 LAB — TROPONIN I (HIGH SENSITIVITY)
Troponin I (High Sensitivity): 73 ng/L — ABNORMAL HIGH (ref <18)
Troponin I (High Sensitivity): 60 ng/L — ABNORMAL HIGH (ref <18)

## 2019-11-24 MED ORDER — IOHEXOL 350 MG/ML SOLN
75.0000 mL | Freq: Once | INTRAVENOUS | Status: AC | PRN
  Administered 2019-11-24: 75 mL via INTRAVENOUS

## 2019-11-24 MED ORDER — LIDOCAINE 5 % EX PTCH
1.0000 | MEDICATED_PATCH | CUTANEOUS | Status: DC
  Administered 2019-11-24: 1 via TRANSDERMAL
  Filled 2019-11-24: qty 1

## 2019-11-24 MED ORDER — LIDOCAINE 5 % EX PTCH: 1.0000 | MEDICATED_PATCH | Freq: Two times a day (BID) | CUTANEOUS | 0 refills | Status: DC

## 2019-11-24 NOTE — Telephone Encounter (Signed)
Patient, sister called to report new acute abdominal, back and rib pain. Patient rates pain 8/10 at this time, states she is unable to move without being in severe pain. Patient also reports minimal oral intake today, had diarrhea this morning. Patient received chemotherapy in clinic yesterday. I spoke with Dr. Grayland Ormond, he recommends patient be evaluated in the ED. Call returned to patient and family, they agree to be evaluated in the ED this evening, plan to call EMS for transport. I spoke with Colletta Maryland in the ED to make them aware that patient is on her way.

## 2019-11-24 NOTE — ED Provider Notes (Signed)
[facility] Emergency Department Provider Note  ____________________________________________   I have reviewed the triage vital signs and the nursing notes.   HISTORY  Chief Complaint Chest Pain   History limited by: Not Limited   HPI Sandra Leonard is a 64 y.o. female who presents to the emergency department today because of concerns for right sided chest pain and shortness of breath. The patient states that she had her third round of chemotherapy yesterday. Today started having right lateral pain and shortness of breath. Has had a cough recently. Denies any lower or upper extremity edema. The patient denies any fevers. Denies any blood clots in the past. Did receive pain medication during transport to the hospital and feels better after receiving that medication.  Records reviewed. Per medical record review patient has a history of lung cancer.   Past Medical History:  Diagnosis Date  . Adrenal gland anomaly    bilateral lesion  . Brain lesion    x3  . Diabetes ([place])   . Diabetes mellitus without complication ([place])   . GERD (gastroesophageal reflux disease)   . Heartburn   . Lesion of left femur   . Lung cancer [facility])     Patient Active Problem List   Diagnosis Date Noted  . Non-small cell carcinoma of lung, right ([place]) 09/26/2019  . Goals of care, counseling/discussion 09/26/2019  . Facial paresthesia 07/29/2019  . Overweight 05/05/2019  . Colon polyp 07/11/2017  . Hyperlipidemia, mixed 07/11/2017  . GERD (gastroesophageal reflux disease) 11/20/2016  . Tobacco use 11/20/2016  . Type 2 diabetes mellitus not at goal [facility]) 01/30/2016    Past Surgical History:  Procedure Laterality Date  . ABDOMINAL HYSTERECTOMY    . CESAREAN SECTION     x2  . PORTA CATH INSERTION N/A 10/01/2019   Procedure: PORTA CATH INSERTION;  Surgeon: Katha Cabal, MD;  Location: [place] [facility];  Service: Cardiovascular;  Laterality: N/A;  . TONSILLECTOMY    .  VIDEO BRONCHOSCOPY WITH ENDOBRONCHIAL ULTRASOUND N/A 09/21/2019   Procedure: VIDEO BRONCHOSCOPY WITH ENDOBRONCHIAL ULTRASOUND;  Surgeon: Tyler Pita, MD;  Location: [facility] ORS;  Service: Pulmonary;  Laterality: N/A;  FLURO ON STANDBY    Prior to Admission medications   Medication Sig Start Date End Date Taking? Authorizing Provider  atorvastatin (LIPITOR) 10 MG tablet Take 10 mg by mouth daily. 01/15/19   [provider]  dexamethasone (DECADRON) 4 MG tablet Take 1 tablet (4 mg total) by mouth daily. 11/16/19   Lloyd Huger, MD  diphenhydrAMINE (BENADRYL ALLERGY) 25 MG tablet Take 25 mg by mouth at bedtime as needed for sleep.     [provider]  doxycycline (VIBRAMYCIN) 100 MG capsule Take 100 mg by mouth 2 (two) times daily. 11/06/19   [provider]  empagliflozin (JARDIANCE) 10 MG TABS tablet Take 10 mg by mouth at bedtime.  01/15/19   [provider]  esomeprazole (NEXIUM) 40 MG capsule Take 40 mg by mouth daily.    [provider]  HYDROcodone-acetaminophen (NORCO) 5-325 MG tablet Take 1 tablet by mouth every 6 (six) hours as needed for moderate pain. 11/16/19   Lloyd Huger, MD  lidocaine-prilocaine (EMLA) cream Apply to affected area once 09/26/19   Lloyd Huger, MD  lisinopril (ZESTRIL) 5 MG tablet Take 5 mg by mouth daily.     [provider]  magic mouthwash w/lidocaine SOLN Take 5 mLs by mouth 4 (four) times daily. 09/23/19   Burns,  Wandra Feinstein, NP  megestrol (MEGACE) 40 MG tablet Take 1 tablet (40 mg total) by mouth daily. 11/03/19   Lloyd Huger, MD  metFORMIN (GLUCOPHAGE) 1000 MG tablet Take 1 tablet (1,000 mg total) by mouth 2 (two) times daily with a meal. 10/27/15   Fisher, Linden Dolin, PA-C  nicotine (NICODERM CQ - DOSED IN MG/24 HOURS) 21 mg/24hr patch Place onto the skin. 09/23/19   [provider]  nicotine (NICOTROL) 10 MG inhaler Inhale 1 Cartridge (1 continuous puffing total) into the lungs  as needed for smoking cessation. 09/23/19   Jacquelin Hawking, NP  omeprazole (PRILOSEC) 20 MG capsule TAKE 1 CAPSULE (20 MG TOTAL) BY MOUTH DAILY. 06/04/16   Caryn Section Linden Dolin, PA-C  ondansetron (ZOFRAN) 8 MG tablet Take 1 tablet (8 mg total) by mouth 2 (two) times daily as needed for refractory nausea / vomiting. 09/26/19   Lloyd Huger, MD  ONE TOUCH ULTRA TEST test strip TEST BLOOD SUGAR IN THE MORNING AND 2 HOURS AFTER LARGEST MEAL 09/15/15   Sable Feil, PA-C  potassium chloride (KLOR-CON) 20 MEQ packet TAKE 1 PACKET BY MOUTH 2 (TWO) TIMES DAILY. 11/24/19   Lloyd Huger, MD  potassium chloride SA (KLOR-CON) 20 MEQ tablet Take 1 tablet (20 mEq total) by mouth 2 (two) times daily. 11/18/19   Lloyd Huger, MD  prochlorperazine (COMPAZINE) 10 MG tablet Take 1 tablet (10 mg total) by mouth every 6 (six) hours as needed (Nausea or vomiting). 09/26/19   Lloyd Huger, MD  sertraline (ZOLOFT) 50 MG tablet Take half a tablet (25mg ) by mouth daily x 1 week and then increase to one tablet (50mg ) daily 09/10/19   Borders, Kirt Boys, NP  traZODone (DESYREL) 100 MG tablet Take 1-2 tablets (100-200 mg total) by mouth at bedtime as needed for sleep. 09/24/19   Borders, Kirt Boys, NP    Allergies Codeine, Penicillins, and Sulfa antibiotics  Family History  Problem Relation Age of Onset  . Breast cancer Mother 58  . Diabetes Mother   . Hypertension Mother   . Hypertension Father   . Diabetes Sister   . Hypertension Sister   . Diabetes Brother     Social History Social History   Tobacco Use  . Smoking status: Current Every Day Smoker    Packs/day: 1.00    Years: 40.00    Pack years: 40.00  . Smokeless tobacco: Never Used  . Tobacco comment: 7-8 cigarettes a day--10/13/2019  Vaping Use  . Vaping Use: Never used  Substance Use Topics  . Alcohol use: No    Alcohol/week: 0.0 standard drinks  . Drug use: No    Review of Systems Constitutional: No fever/chills Eyes: No  visual changes. ENT: No sore throat. Cardiovascular: Positive for chest pain. Respiratory: Positive for shortness of breath. Gastrointestinal: No abdominal pain.  No nausea, no vomiting.  No diarrhea.   Genitourinary: Negative for dysuria. Musculoskeletal: Negative for back pain. Skin: Negative for rash. Neurological: Negative for headaches, focal weakness or numbness.  ____________________________________________   PHYSICAL EXAM:  VITAL SIGNS: ED Triage Vitals [11/24/19 1916]  Enc Vitals Group     BP 138/77     Pulse Rate 99     Resp 20     Temp 98.9 F (37.2 C)     Temp Source Oral     SpO2 98 %   Constitutional: Alert and oriented.  Eyes: Conjunctivae are normal.  ENT      Head: Normocephalic  and atraumatic.      Nose: No congestion/rhinnorhea.      Mouth/Throat: Mucous membranes are moist.      Neck: No stridor. Hematological/Lymphatic/Immunilogical: No cervical lymphadenopathy. Cardiovascular: Tachycardic, regular rhythm.  No murmurs, rubs, or gallops.  Respiratory: Normal respiratory effort without tachypnea nor retractions. Breath sounds are clear and equal bilaterally. No wheezes/rales/rhonchi. Gastrointestinal: Soft and non tender. No rebound. No guarding.  Genitourinary: Deferred Musculoskeletal: Normal range of motion in all extremities. No lower extremity edema. Neurologic:  Normal speech and language. No gross focal neurologic deficits are appreciated.  Skin:  Skin is warm, dry and intact. No rash noted. Psychiatric: Mood and affect are normal. Speech and behavior are normal. Patient exhibits appropriate insight and judgment.  ____________________________________________    LABS (pertinent positives/negatives)  Troponin 73 CBC wbc 24.5, hgb 14.1, plt 400 BMP na 131, k 3.9, cl 102, glu 164, cr 0.45  ____________________________________________   EKG  I, Nance Pear, attending physician, personally viewed and interpreted this EKG  EKG Time:  1916 Rate: 107 Rhythm: sinus tachycardia Axis: rightward axis Intervals: qtc 517 QRS: narrow, q waves v1, v2, v3 ST changes: no st elevation Impression: abnormal ekg ____________________________________________    RADIOLOGY  CXR No active cardiopulmonary disease  ____________________________________________   PROCEDURES  Procedures  ____________________________________________   INITIAL IMPRESSION / ASSESSMENT AND PLAN / ED COURSE  Pertinent labs & imaging results that were available during my care of the patient were reviewed by me and considered in my medical decision making (see chart for details).   Patient presented to the emergency department today because of concerns for right chest pain and shortness of breath.  Given history of cancer I did have concern for possible PE.  CT angio was performed and did not show pulmonary embolism however did show slight worsening of patient's metastatic disease.  However no obvious infection or fractures were apparent.  Patient's initial troponin was slightly elevated however repeat showed improvement.  Patient did feel better after pain medication.  This time I discussed the finding of the CT scan with the patient.  Will plan on discharging home.  Discussed portance of close follow-up with oncology.  ___________________________________________   FINAL CLINICAL IMPRESSION(S) / ED DIAGNOSES  Final diagnoses:  Right-sided chest pain     Note: This dictation was prepared with Dragon dictation. Any transcriptional errors that result from this process are unintentional     Nance Pear, MD 11/24/19 [phone]

## 2019-11-24 NOTE — Discharge Instructions (Signed)
Please seek medical attention for any high fevers, chest pain, shortness of breath, change in behavior, persistent vomiting, bloody stool or any other new or concerning symptoms.  

## 2019-11-24 NOTE — ED Triage Notes (Signed)
Pt comes into the ED via EMS from home with c/o acute right flank pain, 4mg  zofran, 132mcg fentanyl, #[id number]. Pt is currently under going treatment for lung CA. No Hx of kidney stones.

## 2019-11-24 NOTE — ED Triage Notes (Signed)
Pt reports she had third chemo treatment on 9/27 and today pt c/o sharp right posterior rib pain with SOB. Pt denies N/V.

## 2019-11-25 ENCOUNTER — Other Ambulatory Visit: Payer: Self-pay

## 2019-11-25 ENCOUNTER — Telehealth: Payer: Self-pay | Admitting: *Deleted

## 2019-11-25 ENCOUNTER — Encounter: Payer: Self-pay | Admitting: Radiation Oncology

## 2019-11-25 ENCOUNTER — Ambulatory Visit
Admission: RE | Admit: 2019-11-25 | Discharge: 2019-11-25 | Disposition: A | Discharge status: Home / Self Care | Payer: Managed Care, Other (non HMO) | Source: Ambulatory Visit | Attending: Radiation Oncology | Admitting: Radiation Oncology

## 2019-11-25 VITALS — BP 106/65 | HR 123 | Temp 97.1°F | Wt 129.0 lb

## 2019-11-25 DIAGNOSIS — C7931 Secondary malignant neoplasm of brain: Secondary | ICD-10-CM

## 2019-11-25 NOTE — Telephone Encounter (Signed)
Her areas of malignancy only changed in millimeters.  Hard to truly tell if this is actual progression of disease.  Recommend keeping follow-up as scheduled and repeat imaging after fourth cycle as previously planned.

## 2019-11-25 NOTE — Telephone Encounter (Signed)
Patient's sister called to report that the CT she had last night revealed an increase in her lung cancer. She wanted to know if Dr. Grayland Ormond was going to schedule a new appointment with her. She is seeing Dr. Baruch Gouty this afternoon.

## 2019-11-25 NOTE — Progress Notes (Signed)
Radiation Oncology Follow up Note  Name: Sandra Leonard   Date:   11/25/2019 MRN:  [id number] DOB: 02-Oct-1955    This 64 y.o. female presents to the clinic today for 1 month follow-up status post whole brain radiation therapy and palliative treatment to her left hip and patient with known stage IV lung cancer.  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  She is back on steroids because one of her major problems is lack of appetite.  She is currently under treatment with carboplatinum and Taxol by medical oncology.  She is having no headaches at this time no change in visual fields or any focal neurologic deficits.  CT scan in the ER also showed some slight possible progression of the right upper lobe lesion which is abutting the SVC.  COMPLICATIONS OF TREATMENT: none  FOLLOW UP COMPLIANCE: keeps appointments   PHYSICAL EXAM:  BP 106/65   Pulse (!) 123   Temp (!) 97.1 F (36.2 C) (Tympanic)   Wt 129 lb (58.5 kg)   SpO2 99% Comment: room air  BMI 23.59 kg/m  Wheelchair-bound frail-appearing female in NAD.  Well-developed well-nourished patient in NAD. HEENT reveals PERLA, EOMI, discs not visualized.  Oral cavity is clear. No oral mucosal lesions are identified. Neck is clear without evidence of cervical or supraclavicular adenopathy. Lungs are clear to A&P. Cardiac examination is essentially unremarkable with regular rate and rhythm without murmur rub or thrill. Abdomen is benign with no organomegaly or masses noted. Motor sensory and DTR levels are equal and symmetric in the upper and  lower extremities. Cranial nerves II through XII are grossly intact. Proprioception is intact. No peripheral adenopathy or edema is identified. No motor or sensory levels are noted. Crude visual fields are within normal range.  RADIOLOGY RESULTS: CT scans reviewed compatible with above-stated findings from yesterday in the ER  PLAN: Present time patient will continue her chemotherapy and palliative support by medical oncology.  I would offer radiation therapy should she's show symptoms of SVC based on the close location of her tumor in the superior vena cava.  She is having some fullness in her ear I have suggested some Claritin-D for 1 week to alleviate possible fluid in her middle ear.  I am turning follow-up care over to medical oncology be happy to reevaluate the patient for any palliative treatment indicated.  Patient knows to call with any concerns.  I would like to take this opportunity to thank you for allowing me to participate in the care of your patient.Noreene Filbert, MD

## 2019-11-25 NOTE — Telephone Encounter (Signed)
Called patient to let her know to keep her appointments as scheduled.

## 2019-11-27 ENCOUNTER — Inpatient Hospital Stay: Payer: Managed Care, Other (non HMO) | Attending: Oncology

## 2019-11-27 ENCOUNTER — Other Ambulatory Visit: Payer: Self-pay

## 2019-11-27 VITALS — BP 128/83 | HR 103 | Temp 96.9°F | Resp 20

## 2019-11-27 DIAGNOSIS — C7972 Secondary malignant neoplasm of left adrenal gland: Secondary | ICD-10-CM | POA: Insufficient documentation

## 2019-11-27 DIAGNOSIS — Z79899 Other long term (current) drug therapy: Secondary | ICD-10-CM | POA: Insufficient documentation

## 2019-11-27 DIAGNOSIS — C3491 Malignant neoplasm of unspecified part of right bronchus or lung: Secondary | ICD-10-CM

## 2019-11-27 DIAGNOSIS — C7951 Secondary malignant neoplasm of bone: Secondary | ICD-10-CM | POA: Insufficient documentation

## 2019-11-27 DIAGNOSIS — M21372 Foot drop, left foot: Secondary | ICD-10-CM | POA: Insufficient documentation

## 2019-11-27 DIAGNOSIS — C7931 Secondary malignant neoplasm of brain: Secondary | ICD-10-CM | POA: Diagnosis not present

## 2019-11-27 DIAGNOSIS — Z923 Personal history of irradiation: Secondary | ICD-10-CM | POA: Diagnosis not present

## 2019-11-27 DIAGNOSIS — D72829 Elevated white blood cell count, unspecified: Secondary | ICD-10-CM | POA: Insufficient documentation

## 2019-11-27 DIAGNOSIS — C781 Secondary malignant neoplasm of mediastinum: Secondary | ICD-10-CM | POA: Diagnosis not present

## 2019-11-27 DIAGNOSIS — Z5111 Encounter for antineoplastic chemotherapy: Secondary | ICD-10-CM | POA: Diagnosis not present

## 2019-11-27 DIAGNOSIS — C3411 Malignant neoplasm of upper lobe, right bronchus or lung: Secondary | ICD-10-CM | POA: Diagnosis present

## 2019-11-27 DIAGNOSIS — C7971 Secondary malignant neoplasm of right adrenal gland: Secondary | ICD-10-CM | POA: Insufficient documentation

## 2019-11-27 MED ORDER — HEPARIN SOD (PORK) LOCK FLUSH 100 UNIT/ML IV SOLN
INTRAVENOUS | Status: AC
  Filled 2019-11-27: qty 5

## 2019-11-27 MED ORDER — SODIUM CHLORIDE 0.9 % IV SOLN
Freq: Once | INTRAVENOUS | Status: AC
  Administered 2019-11-27: 1000 mL via INTRAVENOUS
  Filled 2019-11-27: qty 250

## 2019-11-27 MED ORDER — SODIUM CHLORIDE 0.9% FLUSH
10.0000 mL | Freq: Once | INTRAVENOUS | Status: AC | PRN
  Administered 2019-11-27: 10 mL
  Filled 2019-11-27: qty 10

## 2019-11-27 MED ORDER — HEPARIN SOD (PORK) LOCK FLUSH 100 UNIT/ML IV SOLN
500.0000 [IU] | Freq: Once | INTRAVENOUS | Status: AC | PRN
  Administered 2019-11-27: 500 [IU]
  Filled 2019-11-27: qty 5

## 2019-11-27 NOTE — Telephone Encounter (Signed)
Opened in error

## 2019-11-30 ENCOUNTER — Other Ambulatory Visit: Payer: Self-pay

## 2019-11-30 ENCOUNTER — Other Ambulatory Visit: Payer: Self-pay | Admitting: *Deleted

## 2019-11-30 ENCOUNTER — Inpatient Hospital Stay (HOSPITAL_BASED_OUTPATIENT_CLINIC_OR_DEPARTMENT_OTHER): Payer: Managed Care, Other (non HMO) | Admitting: Hospice and Palliative Medicine

## 2019-11-30 ENCOUNTER — Inpatient Hospital Stay: Payer: Managed Care, Other (non HMO)

## 2019-11-30 VITALS — BP 118/72 | HR 97 | Temp 96.0°F | Resp 20

## 2019-11-30 DIAGNOSIS — C3411 Malignant neoplasm of upper lobe, right bronchus or lung: Secondary | ICD-10-CM | POA: Diagnosis not present

## 2019-11-30 DIAGNOSIS — C3491 Malignant neoplasm of unspecified part of right bronchus or lung: Secondary | ICD-10-CM

## 2019-11-30 DIAGNOSIS — G893 Neoplasm related pain (acute) (chronic): Secondary | ICD-10-CM

## 2019-11-30 DIAGNOSIS — Z515 Encounter for palliative care: Secondary | ICD-10-CM

## 2019-11-30 LAB — CBC WITH DIFFERENTIAL/PLATELET
Abs Immature Granulocytes: 0 10*3/uL (ref 0.00–0.07)
Band Neutrophils: 7 %
Basophils Absolute: 0 10*3/uL (ref 0.0–0.1)
Basophils Relative: 0 %
Eosinophils Absolute: 0 10*3/uL (ref 0.0–0.5)
Eosinophils Relative: 0 %
HCT: 31.3 % — ABNORMAL LOW (ref 36.0–46.0)
Hemoglobin: 11 g/dL — ABNORMAL LOW (ref 12.0–15.0)
Lymphocytes Relative: 3 %
Lymphs Abs: 0.6 10*3/uL — ABNORMAL LOW (ref 0.7–4.0)
MCH: 32 pg (ref 26.0–34.0)
MCHC: 35.1 g/dL (ref 30.0–36.0)
MCV: 91 fL (ref 80.0–100.0)
Monocytes Absolute: 1.2 10*3/uL — ABNORMAL HIGH (ref 0.1–1.0)
Monocytes Relative: 6 %
Neutro Abs: 18.2 10*3/uL — ABNORMAL HIGH (ref 1.7–7.7)
Neutrophils Relative %: 84 %
Platelets: 254 10*3/uL (ref 150–400)
RBC: 3.44 MIL/uL — ABNORMAL LOW (ref 3.87–5.11)
RDW: 14.1 % (ref 11.5–15.5)
Smear Review: NORMAL
WBC: 20 10*3/uL — ABNORMAL HIGH (ref 4.0–10.5)
nRBC: 0 % (ref 0.0–0.2)

## 2019-11-30 LAB — COMPREHENSIVE METABOLIC PANEL
ALT: 9 U/L (ref 0–44)
AST: 9 U/L — ABNORMAL LOW (ref 15–41)
Albumin: 3.3 g/dL — ABNORMAL LOW (ref 3.5–5.0)
Alkaline Phosphatase: 103 U/L (ref 38–126)
Anion gap: 11 (ref 5–15)
BUN: 14 mg/dL (ref 8–23)
CO2: 18 mmol/L — ABNORMAL LOW (ref 22–32)
Calcium: 8.2 mg/dL — ABNORMAL LOW (ref 8.9–10.3)
Chloride: 103 mmol/L (ref 98–111)
Creatinine, Ser: 0.47 mg/dL (ref 0.44–1.00)
GFR calc Af Amer: >60 mL/min (ref >60)
GFR calc non Af Amer: >60 mL/min (ref >60)
Glucose, Bld: 131 mg/dL — ABNORMAL HIGH (ref 70–99)
Potassium: 4 mmol/L (ref 3.5–5.1)
Sodium: 132 mmol/L — ABNORMAL LOW (ref 135–145)
Total Bilirubin: 0.8 mg/dL (ref 0.3–1.2)
Total Protein: 6 g/dL — ABNORMAL LOW (ref 6.5–8.1)

## 2019-11-30 LAB — MAGNESIUM: Magnesium: 1.1 mg/dL — ABNORMAL LOW (ref 1.7–2.4)

## 2019-11-30 MED ORDER — HEPARIN SOD (PORK) LOCK FLUSH 100 UNIT/ML IV SOLN
500.0000 [IU] | Freq: Once | INTRAVENOUS | Status: AC | PRN
  Administered 2019-11-30: 500 [IU]
  Filled 2019-11-30: qty 5

## 2019-11-30 MED ORDER — HEPARIN SOD (PORK) LOCK FLUSH 100 UNIT/ML IV SOLN
INTRAVENOUS | Status: AC
  Filled 2019-11-30: qty 5

## 2019-11-30 MED ORDER — SODIUM CHLORIDE 0.9 % IV SOLN
Freq: Once | INTRAVENOUS | Status: AC
  Filled 2019-11-30: qty 250

## 2019-11-30 MED ORDER — OXYCODONE HCL 5 MG PO TABS: 5.0000 mg | ORAL_TABLET | ORAL | 0 refills | Status: DC | PRN

## 2019-11-30 MED ORDER — MORPHINE SULFATE 2 MG/ML IJ SOLN
2.0000 mg | Freq: Once | INTRAMUSCULAR | Status: AC
  Administered 2019-11-30: 2 mg via INTRAVENOUS
  Filled 2019-11-30: qty 1

## 2019-11-30 MED ORDER — SODIUM CHLORIDE 0.9% FLUSH
10.0000 mL | Freq: Once | INTRAVENOUS | Status: AC
  Administered 2019-11-30: 10 mL via INTRAVENOUS
  Filled 2019-11-30: qty 10

## 2019-11-30 MED ORDER — MAGNESIUM SULFATE 4 GM/100ML IV SOLN
4.0000 g | Freq: Once | INTRAVENOUS | Status: AC
  Administered 2019-11-30: 4 g via INTRAVENOUS
  Filled 2019-11-30: qty 100

## 2019-11-30 NOTE — Progress Notes (Signed)
[name]  Telephone:([phone] [phone]   Name: Sandra Leonard Date: 11/30/2019 MRN: [id number]  DOB: [birth year]  Patient Care Team: Barbaraann Boys, MD as PCP - General (Pediatrics) Telford Nab, RN as Oncology Nurse Navigator Grayland Ormond, Kathlene November, MD as Consulting Physician (Oncology) Tyler Pita, MD as Consulting Physician (Pulmonary Disease)    REASON FOR CONSULTATION: Sandra Leonard is a 64 y.o. 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  Patient initially presented with left facial numbness.  Work-up including CT and MRI revealed a 3 cm right upper lobe lung lesion and at least 3 brain metastases.  PET/CT revealed hypermetabolic right upper lobe lung mass invading the mediastinum with associated right hilar, infrahilar, and subcarinal metastatic adenopathy.  PET also revealed hypermetabolic epicardial lymph nodes, bilateral adrenal gland metastasis, and bone metastasis in the left femur.  Patient is s/p XRT and on systemic chemotherapy.  She was referred to palliative care to address goals and manage ongoing symptoms.  SOCIAL HISTORY:     reports that she has been smoking. She has a 40.00 pack-year smoking history. She has never used smokeless tobacco. She reports that she does not drink alcohol and does not use drugs.  ADVANCE DIRECTIVES:  None on file  CODE STATUS:   PAST MEDICAL HISTORY: Past Medical History:  Diagnosis Date  . Adrenal gland anomaly    bilateral lesion  . Brain lesion    x3  . Diabetes ([place])   . Diabetes mellitus without complication ([place])   . GERD (gastroesophageal reflux disease)   . Heartburn   . Lesion of left femur   . Lung cancer ([place])     PAST SURGICAL HISTORY:  Past Surgical History:  Procedure Laterality Date  . ABDOMINAL HYSTERECTOMY    . CESAREAN SECTION     x2  . PORTA CATH INSERTION N/A 10/01/2019   Procedure:  PORTA CATH INSERTION;  Surgeon: Katha Cabal, MD;  Location: [facility];  Service: Cardiovascular;  Laterality: N/A;  . TONSILLECTOMY    . VIDEO BRONCHOSCOPY WITH ENDOBRONCHIAL ULTRASOUND N/A 09/21/2019   Procedure: VIDEO BRONCHOSCOPY WITH ENDOBRONCHIAL ULTRASOUND;  Surgeon: Tyler Pita, MD;  Location: [facility];  Service: Pulmonary;  Laterality: N/A;  FLURO ON STANDBY    HEMATOLOGY/ONCOLOGY HISTORY:  Oncology History  Non-small cell carcinoma of lung, right ([place])  09/26/2019 Initial Diagnosis   Non-small cell carcinoma of lung, right ([place])   09/26/2019 Cancer Staging   Staging form: Lung, AJCC 8th Edition - Clinical stage from 09/26/2019: Stage IVB (cT4, cN3, cM1c) - Signed by Lloyd Huger, MD on 09/26/2019   10/05/2019 -  Chemotherapy   The patient had palonosetron (ALOXI) injection 0.25 mg, 0.25 mg, Intravenous,  Once, 3 of 6 cycles Administration: 0.25 mg (10/05/2019), 0.25 mg (10/26/2019), 0.25 mg (11/23/2019) pegfilgrastim-jmdb (FULPHILA) injection 6 mg, 6 mg, Subcutaneous,  Once, 3 of 6 cycles Administration: 6 mg (10/07/2019) CARBOplatin (PARAPLATIN) 520 mg in sodium chloride 0.9 % 250 mL chemo infusion, 520 mg (100 % of original dose 516 mg), Intravenous,  Once, 3 of 6 cycles Dose modification:   (original dose 516 mg, Cycle 1) Administration: 520 mg (10/05/2019), 520 mg (10/26/2019), 470 mg (11/23/2019) fosaprepitant (EMEND) 150 mg in sodium chloride 0.9 % 145 mL IVPB, 150 mg, Intravenous,  Once, 3 of 6 cycles Administration: 150 mg (10/05/2019), 150 mg (10/26/2019), 150 mg (11/23/2019) PACLitaxel (TAXOL) 390 mg in  sodium chloride 0.9 % 500 mL chemo infusion (> 80mg /m2), 225 mg/m2 = 390 mg, Intravenous,  Once, 3 of 6 cycles Administration: 390 mg (10/05/2019), 390 mg (10/26/2019), 366 mg (11/23/2019)  for chemotherapy treatment.      ALLERGIES:  is allergic to codeine, penicillins, and sulfa antibiotics.  MEDICATIONS:  Current Outpatient Medications  Medication Sig  Dispense Refill  . atorvastatin (LIPITOR) 10 MG tablet Take 10 mg by mouth daily.    Marland Kitchen dexamethasone (DECADRON) 4 MG tablet Take 1 tablet (4 mg total) by mouth daily. 30 tablet 1  . diphenhydrAMINE (BENADRYL ALLERGY) 25 MG tablet Take 25 mg by mouth at bedtime as needed for sleep.     Marland Kitchen doxycycline (VIBRAMYCIN) 100 MG capsule Take 100 mg by mouth 2 (two) times daily.    . empagliflozin (JARDIANCE) 10 MG TABS tablet Take 10 mg by mouth at bedtime.     Marland Kitchen esomeprazole (NEXIUM) 40 MG capsule Take 40 mg by mouth daily.    Marland Kitchen HYDROcodone-acetaminophen (NORCO) 5-325 MG tablet Take 1 tablet by mouth every 6 (six) hours as needed for moderate pain. 30 tablet 0  . lidocaine (LIDODERM) 5 % Place 1 patch onto the skin every 12 (twelve) hours. Remove & Discard patch within 12 hours or as directed by MD 10 patch 0  . lidocaine-prilocaine (EMLA) cream Apply to affected area once 30 g 3  . lisinopril (ZESTRIL) 5 MG tablet Take 5 mg by mouth daily.     . magic mouthwash w/lidocaine SOLN Take 5 mLs by mouth 4 (four) times daily. 240 mL 0  . megestrol (MEGACE) 40 MG tablet Take 1 tablet (40 mg total) by mouth daily. 30 tablet 2  . metFORMIN (GLUCOPHAGE) 1000 MG tablet Take 1 tablet (1,000 mg total) by mouth 2 (two) times daily with a meal. 180 tablet 3  . nicotine (NICODERM CQ - DOSED IN MG/24 HOURS) 21 mg/24hr patch Place onto the skin.    . nicotine (NICOTROL) 10 MG inhaler Inhale 1 Cartridge (1 continuous puffing total) into the lungs as needed for smoking cessation. 42 each 0  . omeprazole (PRILOSEC) 20 MG capsule TAKE 1 CAPSULE (20 MG TOTAL) BY MOUTH DAILY. 30 capsule 6  . ondansetron (ZOFRAN) 8 MG tablet Take 1 tablet (8 mg total) by mouth 2 (two) times daily as needed for refractory nausea / vomiting. 60 tablet 2  . ONE TOUCH ULTRA TEST test strip TEST BLOOD SUGAR IN THE MORNING AND 2 HOURS AFTER LARGEST MEAL 50 each 2  . potassium chloride (KLOR-CON) 20 MEQ packet TAKE 1 PACKET BY MOUTH 2 (TWO) TIMES DAILY.  30 packet 2  . potassium chloride SA (KLOR-CON) 20 MEQ tablet Take 1 tablet (20 mEq total) by mouth 2 (two) times daily. 60 tablet 1  . prochlorperazine (COMPAZINE) 10 MG tablet Take 1 tablet (10 mg total) by mouth every 6 (six) hours as needed (Nausea or vomiting). 60 tablet 2  . sertraline (ZOLOFT) 50 MG tablet Take half a tablet (25mg ) by mouth daily x 1 week and then increase to one tablet (50mg ) daily 30 tablet 2  . traZODone (DESYREL) 100 MG tablet Take 1-2 tablets (100-200 mg total) by mouth at bedtime as needed for sleep. 60 tablet 2   No current facility-administered medications for this visit.   Facility-Administered Medications Ordered in Other Visits  Medication Dose Route Frequency Provider Last Rate Last Admin  . heparin lock flush 100 unit/mL  500 Units Intracatheter Once PRN Lloyd Huger, MD      .  magnesium sulfate IVPB 4 g 100 mL  4 g Intravenous Once Lloyd Huger, MD 50 mL/hr at 11/30/19 1042 4 g at 11/30/19 1042    VITAL SIGNS: There were no vitals taken for this visit. There were no vitals filed for this visit.  Estimated body mass index is 23.59 kg/m as calculated from the following:   Height as of 11/23/19: 5\' 2"  (1.575 m).   Weight as of 11/25/19: 129 lb (58.5 kg).  LABS: CBC:    Component Value Date/Time   WBC 20.0 (H) 11/30/2019 1003   HGB 11.0 (L) 11/30/2019 1003   HGB 16.3 (H) 02/28/2017 0832   HCT 31.3 (L) 11/30/2019 1003   HCT 47.7 (H) 02/28/2017 0832   PLT 254 11/30/2019 1003   PLT 337 02/28/2017 0832   MCV 91.0 11/30/2019 1003   MCV 91 02/28/2017 0832   NEUTROABS 18.2 (H) 11/30/2019 1003   NEUTROABS 7.9 (H) 02/28/2017 0832   LYMPHSABS 0.6 (L) 11/30/2019 1003   LYMPHSABS 3.5 (H) 02/28/2017 0832   MONOABS 1.2 (H) 11/30/2019 1003   EOSABS 0.0 11/30/2019 1003   EOSABS 0.1 02/28/2017 0832   BASOSABS 0.0 11/30/2019 1003   BASOSABS 0.1 02/28/2017 0832   Comprehensive Metabolic Panel:    Component Value Date/Time   NA 132 (L)  11/30/2019 1003   NA 143 10/09/2017 0810   K 4.0 11/30/2019 1003   CL 103 11/30/2019 1003   CO2 18 (L) 11/30/2019 1003   BUN 14 11/30/2019 1003   BUN 15 10/09/2017 0810   CREATININE 0.47 11/30/2019 1003   GLUCOSE 131 (H) 11/30/2019 1003   CALCIUM 8.2 (L) 11/30/2019 1003   AST 9 (L) 11/30/2019 1003   ALT 9 11/30/2019 1003   ALKPHOS 103 11/30/2019 1003   BILITOT 0.8 11/30/2019 1003   BILITOT 0.3 10/09/2017 0810   PROT 6.0 (L) 11/30/2019 1003   PROT 6.7 10/09/2017 0810   ALBUMIN 3.3 (L) 11/30/2019 1003   ALBUMIN 4.2 10/09/2017 0810    RADIOGRAPHIC STUDIES: DG Chest 2 View  Result Date: 11/24/2019 CLINICAL DATA:  Chest pain EXAM: CHEST - 2 VIEW COMPARISON:  08/12/2009, CT 08/27/2019 FINDINGS: Right-sided central venous port tip over the SVC. Vague right suprahilar opacity presumably corresponds to lung mass on CT. No pleural effusion or pneumothorax. Normal heart size. Aortic atherosclerosis. IMPRESSION: No active cardiopulmonary disease. Vague right suprahilar opacity presumably corresponds to lung mass on CT. Electronically Signed   By: Donavan Foil M.D.   On: 11/24/2019 20:01   DG Elbow Complete Right  Result Date: 11/07/2019 CLINICAL DATA:  Soft tissue infection at the level of the olecranon EXAM: RIGHT ELBOW - COMPLETE 3+ VIEW COMPARISON:  None. FINDINGS: Frontal, lateral, and bilateral oblique views were obtained. No fracture or dislocation. No joint effusion. No appreciable joint space narrowing or erosion. There is soft tissue prominence at the level of the olecranon process of the proximal ulna without associated soft tissue air. No erosions. IMPRESSION: Soft tissue prominence in the region of the olecranon. This area of soft tissue prominence measures 2.3 x 1.0 cm. No associated air. This finding could represent olecranon bursitis or potential infectious lesion. Well-defined abscess by radiography not confirmed. No associated bony erosion or bony destruction. No fracture or  dislocation. No appreciable joint space narrowing or joint effusion. Electronically Signed   By: Lowella Grip III M.D.   On: 11/07/2019 09:05   CT Angio Chest PE W and/or Wo Contrast  Result Date: 11/24/2019 CLINICAL DATA:  Hervey Ard right  rib pain post chemotherapy 11/23/2019, shortness of breath EXAM: CT ANGIOGRAPHY CHEST WITH CONTRAST TECHNIQUE: Multidetector CT imaging of the chest was performed using the standard protocol during bolus administration of intravenous contrast. Multiplanar CT image reconstructions and MIPs were obtained to evaluate the vascular anatomy. CONTRAST:  25mL OMNIPAQUE IOHEXOL 350 MG/ML SOLN COMPARISON:  Chest radiograph 11/24/2019, PET-CT 09/07/2019, CT chest 08/27/2019 FINDINGS: Cardiovascular: Satisfactory opacification the pulmonary arteries to the segmental level. No pulmonary artery filling defects are identified. Central pulmonary arteries are normal caliber. Suboptimal opacification of the thoracic aorta. Atherosclerotic plaque within the normal caliber aorta. No gross acute luminal abnormality is seen. No periaortic stranding or hemorrhage. Normal 3 vessel branching of the aortic arch. Proximal great vessels are unremarkable. Early branching of the left vertebral artery with minimal plaque at the ostium. Remaining proximal great vessels are moderately calcified as well. There is some mild narrowing of the SVC in the vicinity of the right hilar/paramediastinal mass which could reflect some compression or possible narrowing secondary to a right internal jugular approach Port-A-Cath, the tip positioned at the superior cavoatrial junction. Normal cardiac size. Scattered coronary artery calcifications. Trace pericardial fluid is present, increasing from comparison. Mediastinum/Nodes: Redemonstration of the infiltrative right upper lobe lung lesion extending into the mediastinum both along the right paramediastinal border and into the subcarinal space. Increasing size of the  subcarinal nodal deposit from comparison 08/27/2019 and subsequent PET-CT 09/07/2019 with this measuring up to 2.1 cm in short axis, previously 17 mm (4/40). Increasing right hilar nodal deposit as well measuring up to 19 mm short axis, previously 13 mm (4/37). Few enlarged epicardial lymph nodes are also present and enlarged from prior now measuring up to 11 mm (4/63). Some mild narrowing of the bronchus intermedius and hilar airways in the vicinity of the infiltrative right hilar mass as well as occlusion of the apical segmental airways and anterior segmental airways of the right upper lobe. Loss of discernible fat plane between the right lateral margin of the esophagus and the adjacent subcarinal nodal disease. Thyroid gland and thoracic inlet are unremarkable. Lungs/Pleura: Redemonstration of the spiculated right upper lobe mass along the paramediastinal margins measuring up to 2.8 x 2.7 cm, previously 2.5 x 2.5 cm at a similar level. No other concerning pulmonary nodules or masses. Few scattered calcified granulomata. Some hypoventilatory changes in the lungs are likely accentuated by imaging during exhalation. No consolidation, features of edema, pneumothorax, or effusion. Upper Abdomen: Bilateral nodular thickening of the adrenal glands previously demonstrating hypermetabolism on PET CT and compatible with adrenal metastases, perhaps mildly increased from comparison PET-CT measuring up to 16 mm on the right, previously 11 mm at a similar level. Left adrenal gland is incompletely visualized. No other acute or suspicious abnormality seen in the upper abdomen. Musculoskeletal: Multilevel degenerative changes are present in the imaged portions of the spine. No worrisome lytic or blastic lesions. No worrisome chest wall lesions. Review of the MIP images confirms the above findings. IMPRESSION: 1. No evidence of acute pulmonary embolism. 2. Increase in the size of the spiculated right upper lobe mass along the  paramediastinal margins, with worsening right hilar, mediastinal and epicardial adenopathy. 3. Slight interval increase in the size of the previously hypermetabolic bilateral adrenal metastases. 4. Narrowing of the right upper lobe hilar airways encased by the paramediastinal extension of primary lesion. 5. Additional narrowing of the SVC in the vicinity of the right hilar/paramediastinal mass which could reflect some compression, invasion or possible narrowing secondary fibrosis from a  right internal jugular approach Port-A-Cath, the tip positioned at the superior cavoatrial junction. 6. Aortic Atherosclerosis (ICD10-I70.0). Electronically Signed   By: Lovena Le M.D.   On: 11/24/2019 21:10    PERFORMANCE STATUS (ECOG) : 2 - Symptomatic, <50% confined to bed  Review of Systems Unless otherwise noted, a complete review of systems is negative.  Physical Exam General: NAD Pulmonary: Unlabored Extremities: no edema, no joint deformities Skin: no rashes Neurological: Weakness but otherwise nonfocal  IMPRESSION: Patient was an acute add-on to my clinic schedule today for pain.  She was seen in infusion.  Patient endorses worse pain in the right shoulder/back, which she describes as being positional and feels like a sharp/stabbing sensation.  She was seen in the ER on 11/24/2019 for same pain and underwent CTA negative for PE but was found to have slightly worse metastatic disease.  Presumably, pain is derived from lung cancer.  Patient reports that she has been taking Norco every 6 hours without significant relief.  She says that she received a medication in the ER for pain, which was significantly effective.  Upon chart review, it appears the patient received fentanyl 100 mcg IV and was started on Lidoderm patches.    I explained that I cannot give her IV fentanyl in the infusion center but can give her 2 mg of IV morphine, which she reports having had before and tolerated.  In regards to her  oral pain regimen, will discontinue Norco and rotate to oxycodone for increased potency.  We will liberalize frequency and can consider initiation of a long-acting opioid if needed.  PLAN: -Continue to follow -Morphine 2 mg IV x1 -Discontinue Norco -Start oxycodone 5 mg every 4 hours as needed for pain -Prophylactic bowel regimen -Previously reviewed ACP/MOST form -MyChart visit with me in 1-2 weeks  Case and plan discussed with Dr. Grayland Ormond  Patient expressed understanding and was in agreement with this plan. She also understands that She can call the clinic at any time with any questions, concerns, or complaints.     Time Total: 15 minutes  Visit consisted of counseling and education dealing with the complex and emotionally intense issues of symptom management and palliative care in the setting of serious and potentially life-threatening illness.Greater than 50%  of this time was spent counseling and coordinating care related to the above assessment and plan.  Signed by: Altha Harm, PhD, NP-C

## 2019-12-03 ENCOUNTER — Other Ambulatory Visit: Payer: Self-pay

## 2019-12-03 ENCOUNTER — Inpatient Hospital Stay: Payer: Managed Care, Other (non HMO)

## 2019-12-03 ENCOUNTER — Inpatient Hospital Stay (HOSPITAL_BASED_OUTPATIENT_CLINIC_OR_DEPARTMENT_OTHER): Payer: Managed Care, Other (non HMO)

## 2019-12-03 VITALS — BP 129/70 | HR 105 | Temp 96.0°F

## 2019-12-03 DIAGNOSIS — C3411 Malignant neoplasm of upper lobe, right bronchus or lung: Secondary | ICD-10-CM | POA: Diagnosis not present

## 2019-12-03 DIAGNOSIS — C3491 Malignant neoplasm of unspecified part of right bronchus or lung: Secondary | ICD-10-CM

## 2019-12-03 LAB — CBC WITH DIFFERENTIAL/PLATELET
Abs Immature Granulocytes: 0.84 10*3/uL — ABNORMAL HIGH (ref 0.00–0.07)
Basophils Absolute: 0.2 10*3/uL — ABNORMAL HIGH (ref 0.0–0.1)
Basophils Relative: 1 %
Eosinophils Absolute: 0 10*3/uL (ref 0.0–0.5)
Eosinophils Relative: 0 %
HCT: 34.7 % — ABNORMAL LOW (ref 36.0–46.0)
Hemoglobin: 12 g/dL (ref 12.0–15.0)
Immature Granulocytes: 3 %
Lymphocytes Relative: 2 %
Lymphs Abs: 0.5 10*3/uL — ABNORMAL LOW (ref 0.7–4.0)
MCH: 31.7 pg (ref 26.0–34.0)
MCHC: 34.6 g/dL (ref 30.0–36.0)
MCV: 91.6 fL (ref 80.0–100.0)
Monocytes Absolute: 1.5 10*3/uL — ABNORMAL HIGH (ref 0.1–1.0)
Monocytes Relative: 5 %
Neutro Abs: 28 10*3/uL — ABNORMAL HIGH (ref 1.7–7.7)
Neutrophils Relative %: 89 %
Platelets: 306 10*3/uL (ref 150–400)
RBC: 3.79 MIL/uL — ABNORMAL LOW (ref 3.87–5.11)
RDW: 14.6 % (ref 11.5–15.5)
Smear Review: NORMAL
WBC: 31.1 10*3/uL — ABNORMAL HIGH (ref 4.0–10.5)
nRBC: 0 % (ref 0.0–0.2)

## 2019-12-03 LAB — COMPREHENSIVE METABOLIC PANEL
ALT: 16 U/L (ref 0–44)
AST: 19 U/L (ref 15–41)
Albumin: 3.6 g/dL (ref 3.5–5.0)
Alkaline Phosphatase: 116 U/L (ref 38–126)
Anion gap: 12 (ref 5–15)
BUN: 19 mg/dL (ref 8–23)
CO2: 18 mmol/L — ABNORMAL LOW (ref 22–32)
Calcium: 8.6 mg/dL — ABNORMAL LOW (ref 8.9–10.3)
Chloride: 102 mmol/L (ref 98–111)
Creatinine, Ser: 0.47 mg/dL (ref 0.44–1.00)
GFR calc non Af Amer: >60 mL/min (ref >60)
Glucose, Bld: 140 mg/dL — ABNORMAL HIGH (ref 70–99)
Potassium: 4 mmol/L (ref 3.5–5.1)
Sodium: 132 mmol/L — ABNORMAL LOW (ref 135–145)
Total Bilirubin: 0.8 mg/dL (ref 0.3–1.2)
Total Protein: 6.5 g/dL (ref 6.5–8.1)

## 2019-12-03 LAB — MAGNESIUM: Magnesium: 1.6 mg/dL — ABNORMAL LOW (ref 1.7–2.4)

## 2019-12-03 MED ORDER — SODIUM CHLORIDE 0.9% FLUSH
10.0000 mL | Freq: Once | INTRAVENOUS | Status: AC
  Administered 2019-12-03: 10 mL via INTRAVENOUS
  Filled 2019-12-03: qty 10

## 2019-12-03 MED ORDER — HEPARIN SOD (PORK) LOCK FLUSH 100 UNIT/ML IV SOLN
500.0000 [IU] | Freq: Once | INTRAVENOUS | Status: AC | PRN
  Administered 2019-12-03: 500 [IU]
  Filled 2019-12-03: qty 5

## 2019-12-03 MED ORDER — MAGNESIUM SULFATE 2 GM/50ML IV SOLN
2.0000 g | Freq: Once | INTRAVENOUS | Status: AC
  Administered 2019-12-03: 2 g via INTRAVENOUS
  Filled 2019-12-03: qty 50

## 2019-12-03 MED ORDER — SODIUM CHLORIDE 0.9 % IV SOLN
Freq: Once | INTRAVENOUS | Status: AC
  Filled 2019-12-03: qty 250

## 2019-12-03 MED ORDER — HEPARIN SOD (PORK) LOCK FLUSH 100 UNIT/ML IV SOLN
INTRAVENOUS | Status: AC
  Filled 2019-12-03: qty 5

## 2019-12-03 NOTE — Progress Notes (Signed)
Nutrition Assessment   Reason for Assessment:   Referral from Dr Grayland Ormond, weight loss, poor appetite   ASSESSMENT:  64 year old female with stage IV lung cancer with brain and bone metastatic disease. Past medical history of smoker, DM, GERD. Patient completed whole brain radiation and palliative radiation to left hip.   Patient receiving carboplatin and paclitaxel chemotherapy.   Spoke with patient via phone for nutrition assessment.  Patient reports appetite is horrible and has been for the last 3-4 weeks.  Reports that she takes 3-4 bites and that is all she wants.  Reports metallic taste in mouth and/or no taste.  Reports some nausea and medications help.  Takes imodium for diarrhea.    Patient reports that sister and sister in law is helping her with meals and encouraging her to eat during the day.   Reports that she has been taking megace and has not seen an increase in appetite.     Medications: megace, decadron, jardiance, nexium, magic mouthwash, metformin, prilosec, zofran, KCL, compazine   Labs: reviewed   Anthropometrics:   Height: 62 inches Weight: 129 lb  149 lb in 09/25/19 BMI: 23  13% weight loss in the last 2 months, signficant   Estimated Energy Needs  Kcals: 1475-1700 Protein: 74-85 g Fluid: > 1.4 L   NUTRITION DIAGNOSIS: Inadequate oral intake related to cancer related treatment side effects as evidenced by 13% weight loss in 2 months and poor po intake   INTERVENTION:  Discussed strategies to increase calories and protein. Handout will be provided Discussed strategies to help with taste change. Handout provided Samples of orgain, boost soothe and [employer] shake will be left at registration desk for patient to pick up this afternoon.   Contact information provided   MONITORING, EVALUATION, GOAL: weight trends, intake   Next Visit: November 4th phone f/u  Sandra Leonard, [place], [place] Registered Dietitian [phone] (mobile)

## 2019-12-07 ENCOUNTER — Inpatient Hospital Stay: Payer: Managed Care, Other (non HMO)

## 2019-12-07 ENCOUNTER — Other Ambulatory Visit: Payer: Self-pay

## 2019-12-07 ENCOUNTER — Other Ambulatory Visit: Payer: Self-pay | Admitting: Oncology

## 2019-12-07 DIAGNOSIS — C3491 Malignant neoplasm of unspecified part of right bronchus or lung: Secondary | ICD-10-CM

## 2019-12-07 DIAGNOSIS — C3411 Malignant neoplasm of upper lobe, right bronchus or lung: Secondary | ICD-10-CM | POA: Diagnosis not present

## 2019-12-07 MED ORDER — SODIUM CHLORIDE 0.9% FLUSH
10.0000 mL | Freq: Once | INTRAVENOUS | Status: AC | PRN
  Administered 2019-12-07: 10 mL
  Filled 2019-12-07: qty 10

## 2019-12-07 MED ORDER — HEPARIN SOD (PORK) LOCK FLUSH 100 UNIT/ML IV SOLN
500.0000 [IU] | Freq: Once | INTRAVENOUS | Status: AC | PRN
  Administered 2019-12-07: 500 [IU]
  Filled 2019-12-07: qty 5

## 2019-12-07 MED ORDER — SODIUM CHLORIDE 0.9 % IV SOLN
Freq: Once | INTRAVENOUS | Status: AC
  Filled 2019-12-07: qty 250

## 2019-12-07 MED ORDER — HEPARIN SOD (PORK) LOCK FLUSH 100 UNIT/ML IV SOLN
INTRAVENOUS | Status: AC
  Filled 2019-12-07: qty 5

## 2019-12-07 NOTE — Telephone Encounter (Signed)
   Ref Range & Units 4 d ago 7 d ago  Potassium 3.5 - 5.1 mmol/L 4.0  4.0

## 2019-12-08 ENCOUNTER — Other Ambulatory Visit: Payer: Self-pay | Admitting: *Deleted

## 2019-12-08 MED ORDER — LIDOCAINE 5 % EX PTCH: 1.0000 | MEDICATED_PATCH | Freq: Two times a day (BID) | CUTANEOUS | 0 refills | Status: AC

## 2019-12-08 MED ORDER — HYDROCODONE-ACETAMINOPHEN 7.5-325 MG PO TABS
1.0000 | ORAL_TABLET | ORAL | 0 refills | Status: DC | PRN
Start: 2019-12-08 — End: 2020-01-11

## 2019-12-08 NOTE — Addendum Note (Signed)
Addended by: Telford Nab on: 12/08/2019 02:52 PM   Modules accepted: Orders

## 2019-12-08 NOTE — Telephone Encounter (Signed)
Pt stated that she tried the oxycodone prescribed last week by [facility] but it made her feel "loopy" with slurred speech. She stopped taking it and switch back to hydrocodone which did not manage pain very well. She needs a refill of the hydrocodone and asked if she could have something a little stronger than the hydrocodone but not as strong as the oxycodone. Per Merrily Pew, may send in prescription for hydrocodone-acetaminophen 7.5-325mg  every 4 hours PRN.

## 2019-12-10 ENCOUNTER — Other Ambulatory Visit: Payer: Self-pay | Admitting: Oncology

## 2019-12-10 ENCOUNTER — Inpatient Hospital Stay: Payer: Managed Care, Other (non HMO)

## 2019-12-10 ENCOUNTER — Other Ambulatory Visit: Payer: Self-pay

## 2019-12-10 ENCOUNTER — Inpatient Hospital Stay (HOSPITAL_BASED_OUTPATIENT_CLINIC_OR_DEPARTMENT_OTHER): Payer: Managed Care, Other (non HMO) | Admitting: Nurse Practitioner

## 2019-12-10 VITALS — BP 134/75 | HR 105 | Temp 96.5°F | Resp 18

## 2019-12-10 DIAGNOSIS — G8929 Other chronic pain: Secondary | ICD-10-CM | POA: Diagnosis not present

## 2019-12-10 DIAGNOSIS — I951 Orthostatic hypotension: Secondary | ICD-10-CM

## 2019-12-10 DIAGNOSIS — W19XXXA Unspecified fall, initial encounter: Secondary | ICD-10-CM | POA: Diagnosis not present

## 2019-12-10 DIAGNOSIS — M546 Pain in thoracic spine: Secondary | ICD-10-CM | POA: Diagnosis not present

## 2019-12-10 DIAGNOSIS — C3411 Malignant neoplasm of upper lobe, right bronchus or lung: Secondary | ICD-10-CM | POA: Diagnosis not present

## 2019-12-10 DIAGNOSIS — C3491 Malignant neoplasm of unspecified part of right bronchus or lung: Secondary | ICD-10-CM

## 2019-12-10 MED ORDER — SODIUM CHLORIDE 0.9 % IV SOLN
Freq: Once | INTRAVENOUS | Status: AC
  Filled 2019-12-10: qty 250

## 2019-12-10 MED ORDER — MORPHINE SULFATE ER 15 MG PO TBCR: 15.0000 mg | EXTENDED_RELEASE_TABLET | Freq: Two times a day (BID) | ORAL | 0 refills | Status: DC

## 2019-12-10 MED ORDER — HEPARIN SOD (PORK) LOCK FLUSH 100 UNIT/ML IV SOLN
INTRAVENOUS | Status: AC
  Filled 2019-12-10: qty 5

## 2019-12-10 MED ORDER — HEPARIN SOD (PORK) LOCK FLUSH 100 UNIT/ML IV SOLN
500.0000 [IU] | Freq: Once | INTRAVENOUS | Status: AC | PRN
  Administered 2019-12-10: 500 [IU]
  Filled 2019-12-10: qty 5

## 2019-12-10 MED ORDER — SODIUM CHLORIDE 0.9% FLUSH
10.0000 mL | Freq: Once | INTRAVENOUS | Status: AC | PRN
  Administered 2019-12-10: 10 mL
  Filled 2019-12-10: qty 10

## 2019-12-10 NOTE — Telephone Encounter (Signed)
   Ref Range & Units 7 d ago 10 d ago  Potassium 3.5 - 5.1 mmol/L 4.0  4.0

## 2019-12-10 NOTE — Progress Notes (Signed)
Pt here for hydration .  States she fell 2 nights ago while " rushing " to go to bathroom and hit her head and back against door frame. Denies LOC. States pain is 6/10 . Taking pain meds regularly as prescribed.  Dr Grayland Ormond aware, Beckey Rutter NP aware and will evaluate pt in clinic when completes hydration . Pt discharged via wheelchair to be evaluated

## 2019-12-10 NOTE — Progress Notes (Signed)
[facility] [place]  [phone] [phone]  Patient Care Team: Barbaraann Boys, MD as PCP - General (Pediatrics) Telford Nab, RN as Oncology Nurse Navigator Grayland Ormond, Kathlene November, MD as Consulting Physician (Oncology) Tyler Pita, MD as Consulting Physician (Pulmonary Disease)   Name of the patient: Sandra Leonard  [id number]  Jul 03, 1955   Date of visit: 12/10/19  Diagnosis- Lung Cancer  Chief complaint/ Reason for visit- Back Pain & Fall  Heme/Onc history:  Oncology History  Non-small cell carcinoma of lung, right ([place])  09/26/2019 Initial Diagnosis   Non-small cell carcinoma of lung, right ([place])   09/26/2019 Cancer Staging   Staging form: Lung, AJCC 8th Edition - Clinical stage from 09/26/2019: Stage IVB (cT4, cN3, cM1c) - Signed by Lloyd Huger, MD on 09/26/2019   10/05/2019 -  Chemotherapy   The patient had palonosetron (ALOXI) injection 0.25 mg, 0.25 mg, Intravenous,  Once, 3 of 6 cycles Administration: 0.25 mg (10/05/2019), 0.25 mg (10/26/2019), 0.25 mg (11/23/2019) pegfilgrastim-jmdb (FULPHILA) injection 6 mg, 6 mg, Subcutaneous,  Once, 3 of 6 cycles Administration: 6 mg (10/07/2019) CARBOplatin (PARAPLATIN) 520 mg in sodium chloride 0.9 % 250 mL chemo infusion, 520 mg (100 % of original dose 516 mg), Intravenous,  Once, 3 of 6 cycles Dose modification:   (original dose 516 mg, Cycle 1) Administration: 520 mg (10/05/2019), 520 mg (10/26/2019), 470 mg (11/23/2019) fosaprepitant (EMEND) 150 mg in sodium chloride 0.9 % 145 mL IVPB, 150 mg, Intravenous,  Once, 3 of 6 cycles Administration: 150 mg (10/05/2019), 150 mg (10/26/2019), 150 mg (11/23/2019) PACLitaxel (TAXOL) 390 mg in sodium chloride 0.9 % 500 mL chemo infusion (> 80mg /m2), 225 mg/m2 = 390 mg, Intravenous,  Once, 3 of 6 cycles Administration: 390 mg (10/05/2019), 390 mg (10/26/2019), 366 mg (11/23/2019)  for chemotherapy treatment.      Interval history- Sandra Leonard, 64 year old female with above history of lung cancer, currently receiving carboplatinum and taxol [facility] for complaints of dizzness, weakness, and a fall at home. She says she has been losing weight and feels tired and fatigued. Appetite is poor. She has chronic foot drop and says fall was due to weakness and she tripped and fell. Denies injury from fall or new pain. Back pain is chronic and unchanged but unrelieved by current medication regimen. She was prescribed Oxycodone for short acting pain but says medication made her 'feel loopy' and she has self discontinued. She has been taking Norco for short acting pain. No long acting pain medication. She reports having family support that assists in her care at home.   ECOG FS:3 - Symptomatic, >50% confined to bed  Review of systems- Review of Systems  Constitutional: Positive for malaise/fatigue and weight loss. Negative for chills and fever.  HENT: Negative for hearing loss, nosebleeds, sore throat and tinnitus.   Eyes: Negative for blurred vision and double vision.  Respiratory: Negative for cough, hemoptysis, shortness of breath and wheezing.   Cardiovascular: Negative for chest pain, palpitations and leg swelling.  Gastrointestinal: Negative for abdominal pain, blood in stool, constipation, diarrhea, melena, nausea and vomiting.  Genitourinary: Negative for dysuria and urgency.  Musculoskeletal: Positive for back pain and falls. Negative for joint pain and myalgias.  Skin: Negative for itching and rash.  Neurological: Positive for dizziness, focal weakness and weakness. Negative for tingling, sensory change, loss of consciousness and headaches.  Endo/Heme/Allergies: Negative for environmental allergies. Does not bruise/bleed easily.  Psychiatric/Behavioral: Negative for  depression. The patient is not nervous/anxious and does not have insomnia.      Allergies  Allergen Reactions  . Codeine     "makes her  crazy"  . Penicillins Rash  . Sulfa Antibiotics Rash    Past Medical History:  Diagnosis Date  . Adrenal gland anomaly    bilateral lesion  . Brain lesion    x3  . Diabetes ([place])   . Diabetes mellitus without complication ([place])   . GERD (gastroesophageal reflux disease)   . Heartburn   . Lesion of left femur   . Lung cancer [facility])     Past Surgical History:  Procedure Laterality Date  . ABDOMINAL HYSTERECTOMY    . CESAREAN SECTION     x2  . PORTA CATH INSERTION N/A 10/01/2019   Procedure: PORTA CATH INSERTION;  Surgeon: Katha Cabal, MD;  Location: [facility];  Service: Cardiovascular;  Laterality: N/A;  . TONSILLECTOMY    . VIDEO BRONCHOSCOPY WITH ENDOBRONCHIAL ULTRASOUND N/A 09/21/2019   Procedure: VIDEO BRONCHOSCOPY WITH ENDOBRONCHIAL ULTRASOUND;  Surgeon: Tyler Pita, MD;  Location: [facility] OR[facility];  Service: Pulmonary;  Laterality: N/A;  FLURO ON STANDBY    Social History   Socioeconomic History  . Marital status: Legally Separated    Spouse name: Not on file  . Number of children: Not on file  . Years of education: Not on file  . Highest education level: Not on file  Occupational History  . Not on file  Tobacco Use  . Smoking status: Current Every Day Smoker    Packs/day: 1.00    Years: 40.00    Pack years: 40.00  . Smokeless tobacco: Never Used  . Tobacco comment: 7-8 cigarettes a day--10/13/2019  Vaping Use  . Vaping Use: Never used  Substance and Sexual Activity  . Alcohol use: No    Alcohol/week: 0.0 standard drinks  . Drug use: No  . Sexual activity: Not on file  Other Topics Concern  . Not on file  Social History Narrative  . Not on file   Social Determinants of Health   Financial Resource Strain:   . Difficulty of Paying Living Expenses: Not on file  Food Insecurity:   . Worried About Charity fundraiser in the Last Year: Not on file  . Ran Out of Food in the Last Year: Not on file  Transportation Needs:   . Lack of  Transportation (Medical): Not on file  . Lack of Transportation (Non-Medical): Not on file  Physical Activity:   . Days of Exercise per Week: Not on file  . Minutes of Exercise per Session: Not on file  Stress:   . Feeling of Stress : Not on file  Social Connections:   . Frequency of Communication with Friends and Family: Not on file  . Frequency of Social Gatherings with Friends and Family: Not on file  . Attends Religious Services: Not on file  . Active Member of Clubs or Organizations: Not on file  . Attends Archivist Meetings: Not on file  . Marital Status: Not on file  Intimate Partner Violence:   . Fear of Current or Ex-Partner: Not on file  . Emotionally Abused: Not on file  . Physically Abused: Not on file  . Sexually Abused: Not on file    Family History  Problem Relation Age of Onset  . Breast cancer Mother 34  . Diabetes Mother   . Hypertension Mother   . Hypertension Father   .  Diabetes Sister   . Hypertension Sister   . Diabetes Brother      Current Outpatient Medications:  .  atorvastatin (LIPITOR) 10 MG tablet, Take 10 mg by mouth daily., Disp: , Rfl:  .  dexamethasone (DECADRON) 4 MG tablet, Take 1 tablet (4 mg total) by mouth daily., Disp: 30 tablet, Rfl: 1 .  diphenhydrAMINE (BENADRYL ALLERGY) 25 MG tablet, Take 25 mg by mouth at bedtime as needed for sleep. , Disp: , Rfl:  .  doxycycline (VIBRAMYCIN) 100 MG capsule, Take 100 mg by mouth 2 (two) times daily., Disp: , Rfl:  .  empagliflozin (JARDIANCE) 10 MG TABS tablet, Take 10 mg by mouth at bedtime. , Disp: , Rfl:  .  esomeprazole (NEXIUM) 40 MG capsule, Take 40 mg by mouth daily., Disp: , Rfl:  .  HYDROcodone-acetaminophen (NORCO) 7.5-325 MG tablet, Take 1 tablet by mouth every 4 (four) hours as needed for moderate pain., Disp: 60 tablet, Rfl: 0 .  KLOR-CON M20 20 MEQ tablet, TAKE 1 TABLET BY MOUTH TWICE A DAY, Disp: 60 tablet, Rfl: 1 .  lidocaine (LIDODERM) 5 %, Place 1 patch onto the skin  every 12 (twelve) hours. Remove & Discard patch within 12 hours or as directed by MD, Disp: 10 patch, Rfl: 0 .  lidocaine-prilocaine (EMLA) cream, Apply to affected area once, Disp: 30 g, Rfl: 3 .  lisinopril (ZESTRIL) 5 MG tablet, Take 5 mg by mouth daily. , Disp: , Rfl:  .  magic mouthwash w/lidocaine SOLN, Take 5 mLs by mouth 4 (four) times daily., Disp: 240 mL, Rfl: 0 .  megestrol (MEGACE) 40 MG tablet, Take 1 tablet (40 mg total) by mouth daily., Disp: 30 tablet, Rfl: 2 .  metFORMIN (GLUCOPHAGE) 1000 MG tablet, Take 1 tablet (1,000 mg total) by mouth 2 (two) times daily with a meal., Disp: 180 tablet, Rfl: 3 .  morphine (MS CONTIN) 15 MG 12 hr tablet, Take 1 tablet (15 mg total) by mouth every 12 (twelve) hours. For long acting pain control, Disp: 60 tablet, Rfl: 0 .  nicotine (NICODERM CQ - DOSED IN MG/24 HOURS) 21 mg/24hr patch, Place onto the skin., Disp: , Rfl:  .  nicotine (NICOTROL) 10 MG inhaler, Inhale 1 Cartridge (1 continuous puffing total) into the lungs as needed for smoking cessation., Disp: 42 each, Rfl: 0 .  omeprazole (PRILOSEC) 20 MG capsule, TAKE 1 CAPSULE (20 MG TOTAL) BY MOUTH DAILY., Disp: 30 capsule, Rfl: 6 .  ondansetron (ZOFRAN) 8 MG tablet, Take 1 tablet (8 mg total) by mouth 2 (two) times daily as needed for refractory nausea / vomiting., Disp: 60 tablet, Rfl: 2 .  ONE TOUCH ULTRA TEST test strip, TEST BLOOD SUGAR IN THE MORNING AND 2 HOURS AFTER LARGEST MEAL, Disp: 50 each, Rfl: 2 .  potassium chloride (KLOR-CON) 20 MEQ packet, TAKE 1 PACKET BY MOUTH 2 (TWO) TIMES DAILY., Disp: 30 packet, Rfl: 2 .  prochlorperazine (COMPAZINE) 10 MG tablet, Take 1 tablet (10 mg total) by mouth every 6 (six) hours as needed (Nausea or vomiting)., Disp: 60 tablet, Rfl: 2 .  sertraline (ZOLOFT) 50 MG tablet, Take half a tablet (25mg ) by mouth daily x 1 week and then increase to one tablet (50mg ) daily, Disp: 30 tablet, Rfl: 2 .  traZODone (DESYREL) 100 MG tablet, Take 1-2 tablets (100-200  mg total) by mouth at bedtime as needed for sleep., Disp: 60 tablet, Rfl: 2  Physical exam: There were no vitals filed for this visit. Physical Exam  Constitutional:      General: She is not in acute distress.    Comments: Thin build. Frail-appearing. In wheelchair. Accompanied by sister  HENT:     Head: Normocephalic and atraumatic.     Comments: No apparent bruising or injury nontender Eyes:     General: No scleral icterus.    Extraocular Movements: Extraocular movements intact.     Conjunctiva/sclera: Conjunctivae normal.     Pupils: Pupils are equal, round, and reactive to light.  Cardiovascular:     Rate and Rhythm: Normal rate and regular rhythm.  Pulmonary:     Effort: No respiratory distress.  Abdominal:     Tenderness: There is no abdominal tenderness. There is no right CVA tenderness or left CVA tenderness.  Musculoskeletal:        General: Tenderness (right thoracic right of midline tender to palpation. ) present. No swelling, deformity or signs of injury.     Cervical back: Normal range of motion and neck supple. No rigidity or tenderness.  Skin:    General: Skin is warm and dry.  Neurological:     Mental Status: She is alert and oriented to person, place, and time.     Motor: No weakness.  Psychiatric:        Mood and Affect: Mood normal.        Behavior: Behavior normal.      CMP Latest Ref Rng & Units 12/03/2019  Glucose 70 - 99 mg/dL 140(H)  BUN 8 - 23 mg/dL 19  Creatinine 0.44 - 1.00 mg/dL 0.47  Sodium 135 - 145 mmol/L 132(L)  Potassium 3.5 - 5.1 mmol/L 4.0  Chloride 98 - 111 mmol/L 102  CO2 22 - 32 mmol/L 18(L)  Calcium 8.9 - 10.3 mg/dL 8.6(L)  Total Protein 6.5 - 8.1 g/dL 6.5  Total Bilirubin 0.3 - 1.2 mg/dL 0.8  Alkaline Phos 38 - 126 U/L 116  AST 15 - 41 U/L 19  ALT 0 - 44 U/L 16   CBC Latest Ref Rng & Units 12/03/2019  WBC 4.0 - 10.5 K/uL 31.1(H)  Hemoglobin 12.0 - 15.0 g/dL 12.0  Hematocrit 36 - 46 % 34.7(L)  Platelets 150 - 400 K/uL 306     No images are attached to the encounter.  DG Chest 2 View  Result Date: 11/24/2019 CLINICAL DATA:  Chest pain EXAM: CHEST - 2 VIEW COMPARISON:  08/12/2009, CT 08/27/2019 FINDINGS: Right-sided central venous port tip over the SVC. Vague right suprahilar opacity presumably corresponds to lung mass on CT. No pleural effusion or pneumothorax. Normal heart size. Aortic atherosclerosis. IMPRESSION: No active cardiopulmonary disease. Vague right suprahilar opacity presumably corresponds to lung mass on CT. Electronically Signed   By: Donavan Foil M.D.   On: 11/24/2019 20:01   CT Angio Chest PE W and/or Wo Contrast  Result Date: 11/24/2019 CLINICAL DATA:  Hervey Ard right rib pain post chemotherapy 11/23/2019, shortness of breath EXAM: CT ANGIOGRAPHY CHEST WITH CONTRAST TECHNIQUE: Multidetector CT imaging of the chest was performed using the standard protocol during bolus administration of intravenous contrast. Multiplanar CT image reconstructions and MIPs were obtained to evaluate the vascular anatomy. CONTRAST:  27mL OMNIPAQUE IOHEXOL 350 MG/ML SOLN COMPARISON:  Chest radiograph 11/24/2019, PET-CT 09/07/2019, CT chest 08/27/2019 FINDINGS: Cardiovascular: Satisfactory opacification the pulmonary arteries to the segmental level. No pulmonary artery filling defects are identified. Central pulmonary arteries are normal caliber. Suboptimal opacification of the thoracic aorta. Atherosclerotic plaque within the normal caliber aorta. No gross acute luminal abnormality is seen. No  periaortic stranding or hemorrhage. Normal 3 vessel branching of the aortic arch. Proximal great vessels are unremarkable. Early branching of the left vertebral artery with minimal plaque at the ostium. Remaining proximal great vessels are moderately calcified as well. There is some mild narrowing of the SVC in the vicinity of the right hilar/paramediastinal mass which could reflect some compression or possible narrowing secondary to a right  internal jugular approach Port-A-Cath, the tip positioned at the superior cavoatrial junction. Normal cardiac size. Scattered coronary artery calcifications. Trace pericardial fluid is present, increasing from comparison. Mediastinum/Nodes: Redemonstration of the infiltrative right upper lobe lung lesion extending into the mediastinum both along the right paramediastinal border and into the subcarinal space. Increasing size of the subcarinal nodal deposit from comparison 08/27/2019 and subsequent PET-CT 09/07/2019 with this measuring up to 2.1 cm in short axis, previously 17 mm (4/40). Increasing right hilar nodal deposit as well measuring up to 19 mm short axis, previously 13 mm (4/37). Few enlarged epicardial lymph nodes are also present and enlarged from prior now measuring up to 11 mm (4/63). Some mild narrowing of the bronchus intermedius and hilar airways in the vicinity of the infiltrative right hilar mass as well as occlusion of the apical segmental airways and anterior segmental airways of the right upper lobe. Loss of discernible fat plane between the right lateral margin of the esophagus and the adjacent subcarinal nodal disease. Thyroid gland and thoracic inlet are unremarkable. Lungs/Pleura: Redemonstration of the spiculated right upper lobe mass along the paramediastinal margins measuring up to 2.8 x 2.7 cm, previously 2.5 x 2.5 cm at a similar level. No other concerning pulmonary nodules or masses. Few scattered calcified granulomata. Some hypoventilatory changes in the lungs are likely accentuated by imaging during exhalation. No consolidation, features of edema, pneumothorax, or effusion. Upper Abdomen: Bilateral nodular thickening of the adrenal glands previously demonstrating hypermetabolism on PET CT and compatible with adrenal metastases, perhaps mildly increased from comparison PET-CT measuring up to 16 mm on the right, previously 11 mm at a similar level. Left adrenal gland is incompletely  visualized. No other acute or suspicious abnormality seen in the upper abdomen. Musculoskeletal: Multilevel degenerative changes are present in the imaged portions of the spine. No worrisome lytic or blastic lesions. No worrisome chest wall lesions. Review of the MIP images confirms the above findings. IMPRESSION: 1. No evidence of acute pulmonary embolism. 2. Increase in the size of the spiculated right upper lobe mass along the paramediastinal margins, with worsening right hilar, mediastinal and epicardial adenopathy. 3. Slight interval increase in the size of the previously hypermetabolic bilateral adrenal metastases. 4. Narrowing of the right upper lobe hilar airways encased by the paramediastinal extension of primary lesion. 5. Additional narrowing of the SVC in the vicinity of the right hilar/paramediastinal mass which could reflect some compression, invasion or possible narrowing secondary fibrosis from a right internal jugular approach Port-A-Cath, the tip positioned at the superior cavoatrial junction. 6. Aortic Atherosclerosis (ICD10-I70.0). Electronically Signed   By: Lovena Le M.D.   On: 11/24/2019 21:10    Assessment and plan- Patient is a 64 y.o. female diagnosed with stage IV lung cancer with brain and bone metastases who presents to symptom management clinic for back pain and falls.  1.  Back pain-etiology unclear but suspect related to malignancy given known bilateral adrenal mets.  Does not correlate with fall.  After lengthy discussion, will start long-acting pain medication with MS Contin.  Start MS Contin 15 mg twice daily for long-acting  pain control.  Patient was intolerant to oxycodone and therefore would avoid OxyContin.  Continue hydrocodone for breakthrough pain control.  Risk versus benefit discussed in detail.  Discussed need for bowel prophylaxis.  2.  Falls- mechanical falls but increased weakness and deconditioning likely contributing.  Does not sound syncopal no apparent.   Exam reassuring.  Concerning signs or symptoms discussed and ER precautions given.  Encouraged her to have 24-hour care and assistance at home.  Fall precautions given.  3. Orthostatic hypotension- vitals stable in clinic however given weight loss and symptoms suspect some underlying orthostasis. Will give IV fluids in clinic today. Suspect she would benefit from ongoing scheduled IV fluid support.   Return to clinic if symptoms do not improve or worsen.  We will add visit with palliative care for Monday to follow up. She will see Dr. Grayland Ormond as well.    Visit Diagnosis 1. Chronic right-sided thoracic back pain   2. Accident due to mechanical fall without injury, initial encounter   3. Orthostasis     Patient expressed understanding and was in agreement with this plan. She also understands that She can call clinic at any time with any questions, concerns, or complaints.   Thank you for allowing me to participate in the care of this very pleasant patient.   Sandra Rutter, DNP, AGNP-C [facility]

## 2019-12-14 ENCOUNTER — Inpatient Hospital Stay: Payer: Managed Care, Other (non HMO)

## 2019-12-14 ENCOUNTER — Inpatient Hospital Stay: Payer: Managed Care, Other (non HMO) | Admitting: Oncology

## 2019-12-14 ENCOUNTER — Inpatient Hospital Stay (HOSPITAL_BASED_OUTPATIENT_CLINIC_OR_DEPARTMENT_OTHER): Payer: Managed Care, Other (non HMO) | Admitting: Hospice and Palliative Medicine

## 2019-12-14 ENCOUNTER — Other Ambulatory Visit: Payer: Self-pay

## 2019-12-14 ENCOUNTER — Encounter: Payer: Self-pay | Admitting: Oncology

## 2019-12-14 VITALS — BP 143/83 | HR 90

## 2019-12-14 VITALS — BP 65/47 | HR 91 | Temp 97.5°F | Resp 20 | Wt 123.9 lb

## 2019-12-14 DIAGNOSIS — M546 Pain in thoracic spine: Secondary | ICD-10-CM | POA: Diagnosis not present

## 2019-12-14 DIAGNOSIS — G8929 Other chronic pain: Secondary | ICD-10-CM | POA: Diagnosis not present

## 2019-12-14 DIAGNOSIS — M6281 Muscle weakness (generalized): Secondary | ICD-10-CM

## 2019-12-14 DIAGNOSIS — C3491 Malignant neoplasm of unspecified part of right bronchus or lung: Secondary | ICD-10-CM | POA: Diagnosis not present

## 2019-12-14 DIAGNOSIS — C3411 Malignant neoplasm of upper lobe, right bronchus or lung: Secondary | ICD-10-CM | POA: Diagnosis not present

## 2019-12-14 DIAGNOSIS — Z515 Encounter for palliative care: Secondary | ICD-10-CM | POA: Diagnosis not present

## 2019-12-14 LAB — COMPREHENSIVE METABOLIC PANEL
ALT: 29 U/L (ref 0–44)
AST: 22 U/L (ref 15–41)
Albumin: 3.3 g/dL — ABNORMAL LOW (ref 3.5–5.0)
Alkaline Phosphatase: 90 U/L (ref 38–126)
Anion gap: 11 (ref 5–15)
BUN: 16 mg/dL (ref 8–23)
CO2: 21 mmol/L — ABNORMAL LOW (ref 22–32)
Calcium: 8.7 mg/dL — ABNORMAL LOW (ref 8.9–10.3)
Chloride: 103 mmol/L (ref 98–111)
Creatinine, Ser: 0.49 mg/dL (ref 0.44–1.00)
GFR, Estimated: >60 mL/min (ref >60)
Glucose, Bld: 133 mg/dL — ABNORMAL HIGH (ref 70–99)
Potassium: 3.6 mmol/L (ref 3.5–5.1)
Sodium: 135 mmol/L (ref 135–145)
Total Bilirubin: 0.7 mg/dL (ref 0.3–1.2)
Total Protein: 6.2 g/dL — ABNORMAL LOW (ref 6.5–8.1)

## 2019-12-14 LAB — CBC WITH DIFFERENTIAL/PLATELET
Abs Immature Granulocytes: 0.18 10*3/uL — ABNORMAL HIGH (ref 0.00–0.07)
Basophils Absolute: 0.1 10*3/uL (ref 0.0–0.1)
Basophils Relative: 0 %
Eosinophils Absolute: 0 10*3/uL (ref 0.0–0.5)
Eosinophils Relative: 0 %
HCT: 34.2 % — ABNORMAL LOW (ref 36.0–46.0)
Hemoglobin: 11.6 g/dL — ABNORMAL LOW (ref 12.0–15.0)
Immature Granulocytes: 1 %
Lymphocytes Relative: 15 %
Lymphs Abs: 2.1 10*3/uL (ref 0.7–4.0)
MCH: 32 pg (ref 26.0–34.0)
MCHC: 33.9 g/dL (ref 30.0–36.0)
MCV: 94.2 fL (ref 80.0–100.0)
Monocytes Absolute: 0.9 10*3/uL (ref 0.1–1.0)
Monocytes Relative: 6 %
Neutro Abs: 11 10*3/uL — ABNORMAL HIGH (ref 1.7–7.7)
Neutrophils Relative %: 78 %
Platelets: 352 10*3/uL (ref 150–400)
RBC: 3.63 MIL/uL — ABNORMAL LOW (ref 3.87–5.11)
RDW: 14.4 % (ref 11.5–15.5)
WBC: 14.2 10*3/uL — ABNORMAL HIGH (ref 4.0–10.5)
nRBC: 0 % (ref 0.0–0.2)

## 2019-12-14 LAB — MAGNESIUM: Magnesium: 1.4 mg/dL — ABNORMAL LOW (ref 1.7–2.4)

## 2019-12-14 MED ORDER — HEPARIN SOD (PORK) LOCK FLUSH 100 UNIT/ML IV SOLN
500.0000 [IU] | Freq: Once | INTRAVENOUS | Status: DC | PRN
  Filled 2019-12-14: qty 5

## 2019-12-14 MED ORDER — SODIUM CHLORIDE 0.9 % IV SOLN
Freq: Once | INTRAVENOUS | Status: AC
  Filled 2019-12-14: qty 250

## 2019-12-14 MED ORDER — SODIUM CHLORIDE 0.9 % IV SOLN: 225.0000 mg/m2 | Freq: Once | INTRAVENOUS | Status: DC

## 2019-12-14 MED ORDER — SODIUM CHLORIDE 0.9 % IV SOLN
150.0000 mg | Freq: Once | INTRAVENOUS | Status: AC
  Administered 2019-12-14: 150 mg via INTRAVENOUS
  Filled 2019-12-14: qty 150

## 2019-12-14 MED ORDER — DIPHENHYDRAMINE HCL 50 MG/ML IJ SOLN
25.0000 mg | Freq: Once | INTRAMUSCULAR | Status: AC
  Administered 2019-12-14: 25 mg via INTRAVENOUS
  Filled 2019-12-14: qty 1

## 2019-12-14 MED ORDER — SODIUM CHLORIDE 0.9% FLUSH
10.0000 mL | Freq: Once | INTRAVENOUS | Status: AC
  Administered 2019-12-14: 10 mL via INTRAVENOUS
  Filled 2019-12-14: qty 10

## 2019-12-14 MED ORDER — FAMOTIDINE IN NACL 20-0.9 MG/50ML-% IV SOLN
20.0000 mg | Freq: Once | INTRAVENOUS | Status: AC
  Administered 2019-12-14: 20 mg via INTRAVENOUS
  Filled 2019-12-14: qty 50

## 2019-12-14 MED ORDER — PALONOSETRON HCL INJECTION 0.25 MG/5ML
0.2500 mg | Freq: Once | INTRAVENOUS | Status: AC
  Administered 2019-12-14: 0.25 mg via INTRAVENOUS
  Filled 2019-12-14: qty 5

## 2019-12-14 MED ORDER — SODIUM CHLORIDE 0.9 % IV SOLN
10.0000 mg | Freq: Once | INTRAVENOUS | Status: AC
  Administered 2019-12-14: 10 mg via INTRAVENOUS
  Filled 2019-12-14: qty 10

## 2019-12-14 MED ORDER — SODIUM CHLORIDE 0.9 % IV SOLN
448.5000 mg | Freq: Once | INTRAVENOUS | Status: AC
  Administered 2019-12-14: 450 mg via INTRAVENOUS
  Filled 2019-12-14: qty 45

## 2019-12-14 MED ORDER — SODIUM CHLORIDE 0.9 % IV SOLN: 516.0000 mg | Freq: Once | INTRAVENOUS | Status: DC

## 2019-12-14 MED ORDER — FLUCONAZOLE 150 MG PO TABS: 150.0000 mg | ORAL_TABLET | Freq: Every day | ORAL | 0 refills | Status: AC

## 2019-12-14 MED ORDER — HEPARIN SOD (PORK) LOCK FLUSH 100 UNIT/ML IV SOLN
500.0000 [IU] | Freq: Once | INTRAVENOUS | Status: AC | PRN
  Administered 2019-12-14: 500 [IU]
  Filled 2019-12-14: qty 5

## 2019-12-14 MED ORDER — SODIUM CHLORIDE 0.9 % IV SOLN
225.0000 mg/m2 | Freq: Once | INTRAVENOUS | Status: AC
  Administered 2019-12-14: 354 mg via INTRAVENOUS
  Filled 2019-12-14: qty 59

## 2019-12-17 ENCOUNTER — Other Ambulatory Visit: Payer: Self-pay

## 2019-12-17 ENCOUNTER — Inpatient Hospital Stay: Payer: Managed Care, Other (non HMO)

## 2019-12-17 VITALS — BP 135/62 | HR 111 | Temp 97.0°F

## 2019-12-17 DIAGNOSIS — C3491 Malignant neoplasm of unspecified part of right bronchus or lung: Secondary | ICD-10-CM

## 2019-12-17 DIAGNOSIS — C3411 Malignant neoplasm of upper lobe, right bronchus or lung: Secondary | ICD-10-CM | POA: Diagnosis not present

## 2019-12-17 MED ORDER — HEPARIN SOD (PORK) LOCK FLUSH 100 UNIT/ML IV SOLN
INTRAVENOUS | Status: AC
  Filled 2019-12-17: qty 5

## 2019-12-17 MED ORDER — HEPARIN SOD (PORK) LOCK FLUSH 100 UNIT/ML IV SOLN
500.0000 [IU] | Freq: Once | INTRAVENOUS | Status: AC | PRN
  Administered 2019-12-17: 500 [IU]
  Filled 2019-12-17: qty 5

## 2019-12-17 MED ORDER — SODIUM CHLORIDE 0.9 % IV SOLN
Freq: Once | INTRAVENOUS | Status: AC
  Filled 2019-12-17: qty 250

## 2019-12-21 ENCOUNTER — Other Ambulatory Visit: Payer: Self-pay

## 2019-12-21 ENCOUNTER — Inpatient Hospital Stay: Payer: Managed Care, Other (non HMO)

## 2019-12-21 VITALS — BP 103/73 | HR 108 | Temp 96.8°F

## 2019-12-21 DIAGNOSIS — C3411 Malignant neoplasm of upper lobe, right bronchus or lung: Secondary | ICD-10-CM | POA: Diagnosis not present

## 2019-12-21 DIAGNOSIS — C3491 Malignant neoplasm of unspecified part of right bronchus or lung: Secondary | ICD-10-CM

## 2019-12-21 MED ORDER — HEPARIN SOD (PORK) LOCK FLUSH 100 UNIT/ML IV SOLN
INTRAVENOUS | Status: AC
  Filled 2019-12-21: qty 5

## 2019-12-21 MED ORDER — SODIUM CHLORIDE 0.9 % IV SOLN
Freq: Once | INTRAVENOUS | Status: AC
  Filled 2019-12-21: qty 250

## 2019-12-21 MED ORDER — SODIUM CHLORIDE 0.9% FLUSH
10.0000 mL | Freq: Once | INTRAVENOUS | Status: AC | PRN
  Administered 2019-12-21: 10 mL
  Filled 2019-12-21: qty 10

## 2019-12-21 MED ORDER — HEPARIN SOD (PORK) LOCK FLUSH 100 UNIT/ML IV SOLN
500.0000 [IU] | Freq: Once | INTRAVENOUS | Status: AC | PRN
  Administered 2019-12-21: 500 [IU]
  Filled 2019-12-21: qty 5

## 2019-12-22 ENCOUNTER — Telehealth: Payer: Self-pay | Admitting: *Deleted

## 2019-12-22 ENCOUNTER — Other Ambulatory Visit: Payer: Self-pay | Admitting: *Deleted

## 2019-12-22 DIAGNOSIS — C3491 Malignant neoplasm of unspecified part of right bronchus or lung: Secondary | ICD-10-CM

## 2019-12-22 NOTE — Telephone Encounter (Signed)
Called to follow up with patients sister regarding questions that were discussed at visit yesterday for IVF. Patients sister and family are concerned about patient emotionally at this time, report she seems much more emotional and is crying periodically at home. Patients sister also reports that patient continues to be weak and has very little po intake at this time. She is coming to clinic 2 days/week for IVF. Home health referral was sent last week, [facility] does not accept patients insurance. I reached out to patients insurance company and referral has been sent to [facility] for nursing and PT services. I spoke with nurse manager at [facility], nursing cannot administer IVF at home so we will continue to plan IVF 2 days/week in clinic. Patients sister reports that this morning they noticed that patients urine is dark "tea" colored and does have a foul odor. Per the family it is not realistic at this time to bring patient in for an extra visit today for UA/Labs, spoke with Dr. Grayland Ormond and UA/UC and labs have been ordered for visit for IVF on Thursday. Patient also added to schedule to see Merrily [facility] while in clinic on Thursday to follow up with other concerns voiced by the family. The family will call clinic if there is change in patients condition or she needs to be seen prior to scheduled appointment on Thursday.

## 2019-12-23 ENCOUNTER — Telehealth: Payer: Self-pay

## 2019-12-23 ENCOUNTER — Telehealth: Payer: Self-pay | Admitting: *Deleted

## 2019-12-23 NOTE — Telephone Encounter (Signed)
Teresa from [facility] returned call this morning for Sandra Leonard. States patient is not eligible for home health referral due to insurance.

## 2019-12-23 NOTE — Telephone Encounter (Signed)
Call placed to let her know that referral place to [facility] has denied coverage due to insurance. Will follow up on referral status tomorrow after patient and family meet with [employer]. Patients family is in agreement with this plan.

## 2019-12-24 ENCOUNTER — Inpatient Hospital Stay: Payer: Managed Care, Other (non HMO)

## 2019-12-24 ENCOUNTER — Other Ambulatory Visit: Payer: Self-pay

## 2019-12-24 ENCOUNTER — Inpatient Hospital Stay (HOSPITAL_BASED_OUTPATIENT_CLINIC_OR_DEPARTMENT_OTHER): Payer: Managed Care, Other (non HMO) | Admitting: Hospice and Palliative Medicine

## 2019-12-24 VITALS — BP 126/76 | HR 99 | Temp 96.0°F

## 2019-12-24 DIAGNOSIS — Z7189 Other specified counseling: Secondary | ICD-10-CM

## 2019-12-24 DIAGNOSIS — Z515 Encounter for palliative care: Secondary | ICD-10-CM

## 2019-12-24 DIAGNOSIS — C3491 Malignant neoplasm of unspecified part of right bronchus or lung: Secondary | ICD-10-CM

## 2019-12-24 DIAGNOSIS — C3411 Malignant neoplasm of upper lobe, right bronchus or lung: Secondary | ICD-10-CM | POA: Diagnosis not present

## 2019-12-24 DIAGNOSIS — G893 Neoplasm related pain (acute) (chronic): Secondary | ICD-10-CM

## 2019-12-24 LAB — CBC WITH DIFFERENTIAL/PLATELET
Abs Immature Granulocytes: 0 10*3/uL (ref 0.00–0.07)
Band Neutrophils: 2 %
Basophils Absolute: 0 10*3/uL (ref 0.0–0.1)
Basophils Relative: 0 %
Eosinophils Absolute: 0 10*3/uL (ref 0.0–0.5)
Eosinophils Relative: 0 %
HCT: 34.3 % — ABNORMAL LOW (ref 36.0–46.0)
Hemoglobin: 11.6 g/dL — ABNORMAL LOW (ref 12.0–15.0)
Lymphocytes Relative: 4 %
Lymphs Abs: 0.5 10*3/uL — ABNORMAL LOW (ref 0.7–4.0)
MCH: 32.4 pg (ref 26.0–34.0)
MCHC: 33.8 g/dL (ref 30.0–36.0)
MCV: 95.8 fL (ref 80.0–100.0)
Monocytes Absolute: 0.2 10*3/uL (ref 0.1–1.0)
Monocytes Relative: 2 %
Neutro Abs: 10.8 10*3/uL — ABNORMAL HIGH (ref 1.7–7.7)
Neutrophils Relative %: 92 %
Platelets: 256 10*3/uL (ref 150–400)
RBC: 3.58 MIL/uL — ABNORMAL LOW (ref 3.87–5.11)
RDW: 14.6 % (ref 11.5–15.5)
Smear Review: NORMAL
WBC: 11.5 10*3/uL — ABNORMAL HIGH (ref 4.0–10.5)
nRBC: 0 % (ref 0.0–0.2)

## 2019-12-24 LAB — URINALYSIS, COMPLETE (UACMP) WITH MICROSCOPIC
Bacteria, UA: NONE SEEN
Bilirubin Urine: NEGATIVE
Glucose, UA: NEGATIVE mg/dL
Hgb urine dipstick: NEGATIVE
Ketones, ur: NEGATIVE mg/dL
Nitrite: NEGATIVE
Protein, ur: 30 mg/dL — AB
Specific Gravity, Urine: 1.019 (ref 1.005–1.030)
pH: 5 (ref 5.0–8.0)

## 2019-12-24 LAB — COMPREHENSIVE METABOLIC PANEL
ALT: 164 U/L — ABNORMAL HIGH (ref 0–44)
AST: 115 U/L — ABNORMAL HIGH (ref 15–41)
Albumin: 3.8 g/dL (ref 3.5–5.0)
Alkaline Phosphatase: 121 U/L (ref 38–126)
Anion gap: 9 (ref 5–15)
BUN: 26 mg/dL — ABNORMAL HIGH (ref 8–23)
CO2: 20 mmol/L — ABNORMAL LOW (ref 22–32)
Calcium: 8.7 mg/dL — ABNORMAL LOW (ref 8.9–10.3)
Chloride: 105 mmol/L (ref 98–111)
Creatinine, Ser: 0.67 mg/dL (ref 0.44–1.00)
GFR, Estimated: >60 mL/min (ref >60)
Glucose, Bld: 225 mg/dL — ABNORMAL HIGH (ref 70–99)
Potassium: 4.4 mmol/L (ref 3.5–5.1)
Sodium: 134 mmol/L — ABNORMAL LOW (ref 135–145)
Total Bilirubin: 0.4 mg/dL (ref 0.3–1.2)
Total Protein: 6.6 g/dL (ref 6.5–8.1)

## 2019-12-24 LAB — MAGNESIUM: Magnesium: 1.6 mg/dL — ABNORMAL LOW (ref 1.7–2.4)

## 2019-12-24 MED ORDER — HEPARIN SOD (PORK) LOCK FLUSH 100 UNIT/ML IV SOLN
INTRAVENOUS | Status: AC
  Filled 2019-12-24: qty 5

## 2019-12-24 MED ORDER — HEPARIN SOD (PORK) LOCK FLUSH 100 UNIT/ML IV SOLN
500.0000 [IU] | Freq: Once | INTRAVENOUS | Status: AC | PRN
  Administered 2019-12-24: 500 [IU]
  Filled 2019-12-24: qty 5

## 2019-12-24 MED ORDER — SODIUM CHLORIDE 0.9% FLUSH
10.0000 mL | Freq: Once | INTRAVENOUS | Status: AC
  Administered 2019-12-24: 10 mL via INTRAVENOUS
  Filled 2019-12-24: qty 10

## 2019-12-24 MED ORDER — SODIUM CHLORIDE 0.9 % IV SOLN
Freq: Once | INTRAVENOUS | Status: AC
  Filled 2019-12-24: qty 250

## 2019-12-24 NOTE — Progress Notes (Signed)
MD aware of pt.'s labs. Per MD to just continue with the NS infusion as scheduled. Pt tolerated infusion well with no complications. VSS. Pt stable for discharge.   Sandra Leonard [employer]

## 2019-12-24 NOTE — Progress Notes (Signed)
[place]  [phone] [phone]   Name: Sandra Leonard Date: 12/24/2019 MRN: [id number]  DOB: 1955-12-27  Patient Care Team: Barbaraann Boys, MD as PCP - General (Pediatrics) Telford Nab, RN as Oncology Nurse Navigator Grayland Ormond, Kathlene November, MD as Consulting Physician (Oncology) Tyler Pita, MD as Consulting Physician (Pulmonary Disease)    REASON FOR CONSULTATION: Sandra Leonard is a 64 y.o. 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  Patient initially presented with left facial numbness.  Work-up including CT and MRI revealed a 3 cm right upper lobe lung lesion and at least 3 brain metastases.  PET/CT revealed hypermetabolic right upper lobe lung mass invading the mediastinum with associated right hilar, infrahilar, and subcarinal metastatic adenopathy.  PET also revealed hypermetabolic epicardial lymph nodes, bilateral adrenal gland metastasis, and bone metastasis in the left femur.  Patient is s/p XRT and on systemic chemotherapy.  She was referred to palliative care to address goals and manage ongoing symptoms.  SOCIAL HISTORY:     reports that she has been smoking. She has a 40.00 pack-year smoking history. She has never used smokeless tobacco. She reports that she does not drink alcohol and does not use drugs.  ADVANCE DIRECTIVES:  None on file  CODE STATUS:   PAST MEDICAL HISTORY: Past Medical History:  Diagnosis Date  . Adrenal gland anomaly    bilateral lesion  . Brain lesion    x3  . Diabetes ([place])   . Diabetes mellitus without complication ([place])   . GERD (gastroesophageal reflux disease)   . Heartburn   . Lesion of left femur   . Lung cancer ([place])     PAST SURGICAL HISTORY:  Past Surgical History:  Procedure Laterality Date  . ABDOMINAL HYSTERECTOMY    . CESAREAN SECTION     x2  . PORTA CATH INSERTION N/A 10/01/2019   Procedure:  PORTA CATH INSERTION;  Surgeon: Katha Cabal, MD;  Location: [place];  Service: Cardiovascular;  Laterality: N/A;  . TONSILLECTOMY    . VIDEO BRONCHOSCOPY WITH ENDOBRONCHIAL ULTRASOUND N/A 09/21/2019   Procedure: VIDEO BRONCHOSCOPY WITH ENDOBRONCHIAL ULTRASOUND;  Surgeon: Tyler Pita, MD;  Location: [facility];  Service: Pulmonary;  Laterality: N/A;  FLURO ON STANDBY    HEMATOLOGY/ONCOLOGY HISTORY:  Oncology History  Non-small cell carcinoma of lung, right ([place])  09/26/2019 Initial Diagnosis   Non-small cell carcinoma of lung, right ([place])   09/26/2019 Cancer Staging   Staging form: Lung, AJCC 8th Edition - Clinical stage from 09/26/2019: Stage IVB (cT4, cN3, cM1c) - Signed by Lloyd Huger, MD on 09/26/2019   10/05/2019 -  Chemotherapy   The patient had palonosetron (ALOXI) injection 0.25 mg, 0.25 mg, Intravenous,  Once, 4 of 6 cycles Administration: 0.25 mg (10/05/2019), 0.25 mg (10/26/2019), 0.25 mg (2020/01/05), 0.25 mg (11/23/2019) pegfilgrastim-jmdb (FULPHILA) injection 6 mg, 6 mg, Subcutaneous,  Once, 4 of 6 cycles Administration: 6 mg (10/07/2019) CARBOplatin (PARAPLATIN) 520 mg in sodium chloride 0.9 % 250 mL chemo infusion, 520 mg (100 % of original dose 516 mg), Intravenous,  Once, 4 of 6 cycles Dose modification:   (original dose 516 mg, Cycle 1) Administration: 520 mg (10/05/2019), 520 mg (10/26/2019), 450 mg (Jan 05, 2020), 470 mg (11/23/2019) fosaprepitant (EMEND) 150 mg in sodium chloride 0.9 % 145 mL IVPB, 150 mg, Intravenous,  Once, 4 of 6 cycles Administration: 150 mg (10/05/2019), 150 mg (10/26/2019), 150 mg (  01-05-20), 150 mg (11/23/2019) PACLitaxel (TAXOL) 390 mg in sodium chloride 0.9 % 500 mL chemo infusion (> $RemoveBef'80mg'PPsKHNWEqm$ /m2), 225 mg/m2 = 390 mg, Intravenous,  Once, 4 of 6 cycles Administration: 390 mg (10/05/2019), 390 mg (10/26/2019), 354 mg (January 05, 2020), 366 mg (11/23/2019)  for chemotherapy treatment.      ALLERGIES:  is allergic to codeine, penicillins, and  sulfa antibiotics.  MEDICATIONS:  Current Outpatient Medications  Medication Sig Dispense Refill  . atorvastatin (LIPITOR) 10 MG tablet Take 10 mg by mouth daily.    Marland Kitchen dexamethasone (DECADRON) 4 MG tablet Take 1 tablet (4 mg total) by mouth daily. 30 tablet 1  . diphenhydrAMINE (BENADRYL ALLERGY) 25 MG tablet Take 25 mg by mouth at bedtime as needed for sleep.     Marland Kitchen doxycycline (VIBRAMYCIN) 100 MG capsule Take 100 mg by mouth 2 (two) times daily.    . empagliflozin (JARDIANCE) 10 MG TABS tablet Take 10 mg by mouth at bedtime.     Marland Kitchen esomeprazole (NEXIUM) 40 MG capsule Take 40 mg by mouth daily.    . fluconazole (DIFLUCAN) 150 MG tablet Take 1 tablet (150 mg total) by mouth daily. 10 tablet 0  . HYDROcodone-acetaminophen (NORCO) 7.5-325 MG tablet Take 1 tablet by mouth every 4 (four) hours as needed for moderate pain. 60 tablet 0  . KLOR-CON M20 20 MEQ tablet TAKE 1 TABLET BY MOUTH TWICE A DAY 60 tablet 1  . lidocaine (LIDODERM) 5 % Place 1 patch onto the skin every 12 (twelve) hours. Remove & Discard patch within 12 hours or as directed by MD 10 patch 0  . lidocaine-prilocaine (EMLA) cream Apply to affected area once 30 g 3  . lisinopril (ZESTRIL) 5 MG tablet Take 5 mg by mouth daily.     . magic mouthwash w/lidocaine SOLN Take 5 mLs by mouth 4 (four) times daily. 240 mL 0  . megestrol (MEGACE) 40 MG tablet Take 1 tablet (40 mg total) by mouth daily. 30 tablet 2  . metFORMIN (GLUCOPHAGE) 1000 MG tablet Take 1 tablet (1,000 mg total) by mouth 2 (two) times daily with a meal. 180 tablet 3  . morphine (MS CONTIN) 15 MG 12 hr tablet Take 1 tablet (15 mg total) by mouth every 12 (twelve) hours. For long acting pain control 60 tablet 0  . nicotine (NICODERM CQ - DOSED IN MG/24 HOURS) 21 mg/24hr patch Place onto the skin.    . nicotine (NICOTROL) 10 MG inhaler Inhale 1 Cartridge (1 continuous puffing total) into the lungs as needed for smoking cessation. 42 each 0  . omeprazole (PRILOSEC) 20 MG  capsule TAKE 1 CAPSULE (20 MG TOTAL) BY MOUTH DAILY. 30 capsule 6  . ondansetron (ZOFRAN) 8 MG tablet Take 1 tablet (8 mg total) by mouth 2 (two) times daily as needed for refractory nausea / vomiting. 60 tablet 2  . ONE TOUCH ULTRA TEST test strip TEST BLOOD SUGAR IN THE MORNING AND 2 HOURS AFTER LARGEST MEAL 50 each 2  . potassium chloride (KLOR-CON) 20 MEQ packet TAKE 1 PACKET BY MOUTH 2 (TWO) TIMES DAILY. 30 packet 2  . prochlorperazine (COMPAZINE) 10 MG tablet Take 1 tablet (10 mg total) by mouth every 6 (six) hours as needed (Nausea or vomiting). 60 tablet 2  . sertraline (ZOLOFT) 50 MG tablet Take half a tablet ($RemoveB'25mg'AzSYvOFt$ ) by mouth daily x 1 week and then increase to one tablet ($RemoveBef'50mg'aFQCSpAEKV$ ) daily 30 tablet 2  . traZODone (DESYREL) 100 MG tablet Take 1-2 tablets (100-200 mg total)  by mouth at bedtime as needed for sleep. 60 tablet 2   No current facility-administered medications for this visit.   Facility-Administered Medications Ordered in Other Visits  Medication Dose Route Frequency Provider Last Rate Last Admin  . heparin lock flush 100 unit/mL  500 Units Intracatheter Once PRN Lloyd Huger, MD        VITAL SIGNS: There were no vitals taken for this visit. There were no vitals filed for this visit.  Estimated body mass index is 22.66 kg/m as calculated from the following:   Height as of 11/23/19: $RemoveBef'5\' 2"'TbkfEBoBGZ$  (1.575 m).   Weight as of 10-Jan-2020: 123 lb 14.4 oz (56.2 kg).  LABS: CBC:    Component Value Date/Time   WBC 11.5 (H) 12/24/2019 1349   HGB 11.6 (L) 12/24/2019 1349   HGB 16.3 (H) 02/28/2017 0832   HCT 34.3 (L) 12/24/2019 1349   HCT 47.7 (H) 02/28/2017 0832   PLT 256 12/24/2019 1349   PLT 337 02/28/2017 0832   MCV 95.8 12/24/2019 1349   MCV 91 02/28/2017 0832   NEUTROABS 10.8 (H) 12/24/2019 1349   NEUTROABS 7.9 (H) 02/28/2017 0832   LYMPHSABS 0.5 (L) 12/24/2019 1349   LYMPHSABS 3.5 (H) 02/28/2017 0832   MONOABS 0.2 12/24/2019 1349   EOSABS 0.0 12/24/2019 1349   EOSABS 0.1  02/28/2017 0832   BASOSABS 0.0 12/24/2019 1349   BASOSABS 0.1 02/28/2017 0832   Comprehensive Metabolic Panel:    Component Value Date/Time   NA 134 (L) 12/24/2019 1349   NA 143 10/09/2017 0810   K 4.4 12/24/2019 1349   CL 105 12/24/2019 1349   CO2 20 (L) 12/24/2019 1349   BUN 26 (H) 12/24/2019 1349   BUN 15 10/09/2017 0810   CREATININE 0.67 12/24/2019 1349   GLUCOSE 225 (H) 12/24/2019 1349   CALCIUM 8.7 (L) 12/24/2019 1349   AST 115 (H) 12/24/2019 1349   ALT 164 (H) 12/24/2019 1349   ALKPHOS 121 12/24/2019 1349   BILITOT 0.4 12/24/2019 1349   BILITOT 0.3 10/09/2017 0810   PROT 6.6 12/24/2019 1349   PROT 6.7 10/09/2017 0810   ALBUMIN 3.8 12/24/2019 1349   ALBUMIN 4.2 10/09/2017 0810    RADIOGRAPHIC STUDIES: DG Chest 2 View  Result Date: 11/24/2019 CLINICAL DATA:  Chest pain EXAM: CHEST - 2 VIEW COMPARISON:  08/12/2009, CT 08/27/2019 FINDINGS: Right-sided central venous port tip over the SVC. Vague right suprahilar opacity presumably corresponds to lung mass on CT. No pleural effusion or pneumothorax. Normal heart size. Aortic atherosclerosis. IMPRESSION: No active cardiopulmonary disease. Vague right suprahilar opacity presumably corresponds to lung mass on CT. Electronically Signed   By: Donavan Foil M.D.   On: 11/24/2019 20:01   CT Angio Chest PE W and/or Wo Contrast  Result Date: 11/24/2019 CLINICAL DATA:  Hervey Ard right rib pain post chemotherapy 11/23/2019, shortness of breath EXAM: CT ANGIOGRAPHY CHEST WITH CONTRAST TECHNIQUE: Multidetector CT imaging of the chest was performed using the standard protocol during bolus administration of intravenous contrast. Multiplanar CT image reconstructions and MIPs were obtained to evaluate the vascular anatomy. CONTRAST:  15mL OMNIPAQUE IOHEXOL 350 MG/ML SOLN COMPARISON:  Chest radiograph 11/24/2019, PET-CT 09/07/2019, CT chest 08/27/2019 FINDINGS: Cardiovascular: Satisfactory opacification the pulmonary arteries to the segmental level. No  pulmonary artery filling defects are identified. Central pulmonary arteries are normal caliber. Suboptimal opacification of the thoracic aorta. Atherosclerotic plaque within the normal caliber aorta. No gross acute luminal abnormality is seen. No periaortic stranding or hemorrhage. Normal 3 vessel branching of  the aortic arch. Proximal great vessels are unremarkable. Early branching of the left vertebral artery with minimal plaque at the ostium. Remaining proximal great vessels are moderately calcified as well. There is some mild narrowing of the SVC in the vicinity of the right hilar/paramediastinal mass which could reflect some compression or possible narrowing secondary to a right internal jugular approach Port-A-Cath, the tip positioned at the superior cavoatrial junction. Normal cardiac size. Scattered coronary artery calcifications. Trace pericardial fluid is present, increasing from comparison. Mediastinum/Nodes: Redemonstration of the infiltrative right upper lobe lung lesion extending into the mediastinum both along the right paramediastinal border and into the subcarinal space. Increasing size of the subcarinal nodal deposit from comparison 08/27/2019 and subsequent PET-CT 09/07/2019 with this measuring up to 2.1 cm in short axis, previously 17 mm (4/40). Increasing right hilar nodal deposit as well measuring up to 19 mm short axis, previously 13 mm (4/37). Few enlarged epicardial lymph nodes are also present and enlarged from prior now measuring up to 11 mm (4/63). Some mild narrowing of the bronchus intermedius and hilar airways in the vicinity of the infiltrative right hilar mass as well as occlusion of the apical segmental airways and anterior segmental airways of the right upper lobe. Loss of discernible fat plane between the right lateral margin of the esophagus and the adjacent subcarinal nodal disease. Thyroid gland and thoracic inlet are unremarkable. Lungs/Pleura: Redemonstration of the  spiculated right upper lobe mass along the paramediastinal margins measuring up to 2.8 x 2.7 cm, previously 2.5 x 2.5 cm at a similar level. No other concerning pulmonary nodules or masses. Few scattered calcified granulomata. Some hypoventilatory changes in the lungs are likely accentuated by imaging during exhalation. No consolidation, features of edema, pneumothorax, or effusion. Upper Abdomen: Bilateral nodular thickening of the adrenal glands previously demonstrating hypermetabolism on PET CT and compatible with adrenal metastases, perhaps mildly increased from comparison PET-CT measuring up to 16 mm on the right, previously 11 mm at a similar level. Left adrenal gland is incompletely visualized. No other acute or suspicious abnormality seen in the upper abdomen. Musculoskeletal: Multilevel degenerative changes are present in the imaged portions of the spine. No worrisome lytic or blastic lesions. No worrisome chest wall lesions. Review of the MIP images confirms the above findings. IMPRESSION: 1. No evidence of acute pulmonary embolism. 2. Increase in the size of the spiculated right upper lobe mass along the paramediastinal margins, with worsening right hilar, mediastinal and epicardial adenopathy. 3. Slight interval increase in the size of the previously hypermetabolic bilateral adrenal metastases. 4. Narrowing of the right upper lobe hilar airways encased by the paramediastinal extension of primary lesion. 5. Additional narrowing of the SVC in the vicinity of the right hilar/paramediastinal mass which could reflect some compression, invasion or possible narrowing secondary fibrosis from a right internal jugular approach Port-A-Cath, the tip positioned at the superior cavoatrial junction. 6. Aortic Atherosclerosis (ICD10-I70.0). Electronically Signed   By: Lovena Le M.D.   On: 11/24/2019 21:10    PERFORMANCE STATUS (ECOG) : 2 - Symptomatic, <50% confined to bed  Review of Systems Unless otherwise  noted, a complete review of systems is negative.  Physical Exam General: NAD, thin, frail-appearing Pulmonary: Unlabored Extremities: no edema, no joint deformities Skin: no rashes Neurological: Weakness but otherwise nonfocal  IMPRESSION: Patient was an add-on to my clinic schedule today for evaluation of weakness and declining performance status.  Patient was seen in the infusion area.  Patient reports that she has become progressively weaker  over the past several weeks.  Per family report, patient is requiring 2 person assistance with ADLs.  Family has had to put the mattress of the bed on the floor to facilitate transfers.  Oral intake is minimal.  Family report the patient has not had appreciable food intake in many weeks.  She is mostly just drinking fluids and even that has minimal intake.  Patient is having near syncopal episodes at home presumably from orthostasis.  She is requiring twice weekly IV fluids to manage dehydration.  Patient is pending PET scan next week to restage the cancer.  CTA last month revealed disease progression.    I talked with patient about her decline likely been secondary to treatment effects and possible disease progression.  Even if PET scan reveals improvement, patient is unlikely to be a treatment candidate in the near future given her poor performance status.  She was tearful as she asked me to prognosticate.  She stated that she thought her life expectancy was likely to be 5 or 6 years.  We talked about option for hospice to facilitate her care in the home.  Patient states that she has started to have end-of-life discussions with her 2 sons.  She wants to continue the current plan for PET scan next week.  We also discussed CODE STATUS.  She does not seem keen on the idea of futile care at end-of-life but wants to speak with her children prior to making any final decisions.  I also met with patient's sister today who verbalized concerns regarding  patient's decline.  She confirmed that family is having some difficulty in caring for patient at home given progressive weakness.  PLAN: -PET scan on 11/2 -Dr. Grayland Ormond and I will see patient following PET scan -Continue MS Contin/Norco -RTC next week  Case and plan discussed with Dr. Grayland Ormond  Patient expressed understanding and was in agreement with this plan. She also understands that She can call the clinic at any time with any questions, concerns, or complaints.     Time Total: 45 minutes  Visit consisted of counseling and education dealing with the complex and emotionally intense issues of symptom management and palliative care in the setting of serious and potentially life-threatening illness.Greater than 50%  of this time was spent counseling and coordinating care related to the above assessment and plan.  Signed by: Altha Harm, PhD, NP-C

## 2019-12-26 LAB — URINE CULTURE

## 2019-12-26 NOTE — Progress Notes (Signed)
Sandra Leonard  [phone] [phone]  ID: Sandra Leonard OB: 01/09/1956  MR#: [id number]  [facility]#:[id number]  Patient Care Team: Barbaraann Boys, MD as PCP - General (Pediatrics) Telford Nab, RN as Oncology Nurse Navigator Grayland Ormond, Kathlene November, MD as Consulting Physician (Oncology) Tyler Pita, MD as Consulting Physician (Pulmonary Disease)   I connected with Sandra Leonard on 01/01/20 at  1:30 PM EDT by video enabled telemedicine visit and verified that I am speaking with the correct person using two identifiers.   I discussed the limitations, risks, security and privacy concerns of performing an evaluation and management service by telemedicine and the availability of in-person appointments. I also discussed with the patient that there may be a patient responsible charge related to this service. The patient expressed understanding and agreed to proceed.   Other persons participating in the visit and their role in the encounter: Patient, patient's son and patient's sister, MD.  Patient's location: Home. Provider's location: Clinic.  CHIEF COMPLAINT: Stage IVb non-small cell lung cancer, NOS.  INTERVAL HISTORY: Patient agreed to video enabled telemedicine visit for further evaluation and discussion of her PET scan results.  Her performance status continues to decline and she is lying on the couch for the entirety of the visit.  She has a poor appetite and continues to lose weight.  She continues to have left foot drop which is helped by her brace.  She has no other neurologic complaints.  She denies any recent fevers or illnesses. She has no chest pain, shortness of breath, cough, or hemoptysis.  She denies any nausea, vomiting, constipation, or diarrhea.  She has no melena or hematochezia.  She has no urinary complaints.  Patient feels generally terrible, but offers no further specific complaints today.  REVIEW OF SYSTEMS:   Review of Systems    Constitutional: Positive for malaise/fatigue and weight loss. Negative for fever.  Respiratory: Negative.  Negative for cough, hemoptysis and shortness of breath.   Cardiovascular: Negative.  Negative for chest pain and leg swelling.  Gastrointestinal: Negative.  Negative for abdominal pain.  Genitourinary: Negative.  Negative for dysuria.  Musculoskeletal: Negative.  Negative for back pain.  Skin: Negative.  Negative for rash.  Neurological: Positive for dizziness, focal weakness and weakness. Negative for tingling, speech change and headaches.  Psychiatric/Behavioral: Negative.  The patient is not nervous/anxious.     As per HPI. Otherwise, a complete review of systems is negative.  PAST MEDICAL HISTORY: Past Medical History:  Diagnosis Date  . Adrenal gland anomaly    bilateral lesion  . Brain lesion    x3  . Diabetes ([place])   . Diabetes mellitus without complication ([place])   . GERD (gastroesophageal reflux disease)   . Heartburn   . Lesion of left femur   . Lung cancer ([place])     PAST SURGICAL HISTORY: Past Surgical History:  Procedure Laterality Date  . ABDOMINAL HYSTERECTOMY    . CESAREAN SECTION     x2  . PORTA CATH INSERTION N/A 10/01/2019   Procedure: PORTA CATH INSERTION;  Surgeon: Katha Cabal, MD;  Location: [facility];  Service: Cardiovascular;  Laterality: N/A;  . TONSILLECTOMY    . VIDEO BRONCHOSCOPY WITH ENDOBRONCHIAL ULTRASOUND N/A 09/21/2019   Procedure: VIDEO BRONCHOSCOPY WITH ENDOBRONCHIAL ULTRASOUND;  Surgeon: Tyler Pita, MD;  Location: [facility];  Service: Pulmonary;  Laterality: N/A;  FLURO ON STANDBY    FAMILY HISTORY: Family History  Problem Relation Age of Onset  .  Breast cancer Mother 41  . Diabetes Mother   . Hypertension Mother   . Hypertension Father   . Diabetes Sister   . Hypertension Sister   . Diabetes Brother     ADVANCED DIRECTIVES (Y/N):  N  HEALTH MAINTENANCE: Social History   Tobacco Use  . Smoking  status: Current Every Day Smoker    Packs/day: 1.00    Years: 40.00    Pack years: 40.00  . Smokeless tobacco: Never Used  . Tobacco comment: 7-8 cigarettes a day--10/13/2019  Vaping Use  . Vaping Use: Never used  Substance Use Topics  . Alcohol use: No    Alcohol/week: 0.0 standard drinks  . Drug use: No     Colonoscopy:  PAP:  Bone density:  Lipid panel:  Allergies  Allergen Reactions  . Codeine     "makes her crazy"  . Penicillins Rash  . Sulfa Antibiotics Rash    Current Outpatient Medications  Medication Sig Dispense Refill  . atorvastatin (LIPITOR) 10 MG tablet Take 10 mg by mouth daily.    Marland Kitchen dexamethasone (DECADRON) 4 MG tablet Take 1 tablet (4 mg total) by mouth daily. 30 tablet 1  . diphenhydrAMINE (BENADRYL ALLERGY) 25 MG tablet Take 25 mg by mouth at bedtime as needed for sleep.     Marland Kitchen doxycycline (VIBRAMYCIN) 100 MG capsule Take 100 mg by mouth 2 (two) times daily.    . empagliflozin (JARDIANCE) 10 MG TABS tablet Take 10 mg by mouth at bedtime.     Marland Kitchen esomeprazole (NEXIUM) 40 MG capsule Take 40 mg by mouth daily.    . fluconazole (DIFLUCAN) 150 MG tablet Take 1 tablet (150 mg total) by mouth daily. 10 tablet 0  . HYDROcodone-acetaminophen (NORCO) 7.5-325 MG tablet Take 1 tablet by mouth every 4 (four) hours as needed for moderate pain. 60 tablet 0  . KLOR-CON M20 20 MEQ tablet TAKE 1 TABLET BY MOUTH TWICE A DAY 60 tablet 1  . lidocaine (LIDODERM) 5 % Place 1 patch onto the skin every 12 (twelve) hours. Remove & Discard patch within 12 hours or as directed by MD 10 patch 0  . lidocaine-prilocaine (EMLA) cream Apply to affected area once 30 g 3  . lisinopril (ZESTRIL) 5 MG tablet Take 5 mg by mouth daily.     . magic mouthwash w/lidocaine SOLN Take 5 mLs by mouth 4 (four) times daily. 240 mL 0  . megestrol (MEGACE) 40 MG tablet Take 1 tablet (40 mg total) by mouth daily. 30 tablet 2  . metFORMIN (GLUCOPHAGE) 1000 MG tablet Take 1 tablet (1,000 mg total) by mouth 2  (two) times daily with a meal. 180 tablet 3  . morphine (MS CONTIN) 15 MG 12 hr tablet Take 1 tablet (15 mg total) by mouth every 12 (twelve) hours. For long acting pain control 60 tablet 0  . nicotine (NICODERM CQ - DOSED IN MG/24 HOURS) 21 mg/24hr patch Place onto the skin.    . nicotine (NICOTROL) 10 MG inhaler Inhale 1 Cartridge (1 continuous puffing total) into the lungs as needed for smoking cessation. 42 each 0  . omeprazole (PRILOSEC) 20 MG capsule TAKE 1 CAPSULE (20 MG TOTAL) BY MOUTH DAILY. 30 capsule 6  . ondansetron (ZOFRAN) 8 MG tablet Take 1 tablet (8 mg total) by mouth 2 (two) times daily as needed for refractory nausea / vomiting. 60 tablet 2  . ONE TOUCH ULTRA TEST test strip TEST BLOOD SUGAR IN THE MORNING AND 2 HOURS AFTER LARGEST  MEAL 50 each 2  . potassium chloride (KLOR-CON) 20 MEQ packet TAKE 1 PACKET BY MOUTH 2 (TWO) TIMES DAILY. 30 packet 2  . prochlorperazine (COMPAZINE) 10 MG tablet Take 1 tablet (10 mg total) by mouth every 6 (six) hours as needed (Nausea or vomiting). 60 tablet 2  . sertraline (ZOLOFT) 50 MG tablet Take half a tablet ($RemoveB'25mg'yacsNgfw$ ) by mouth daily x 1 week and then increase to one tablet ($RemoveBef'50mg'lAWXMBMKyR$ ) daily 30 tablet 2  . traZODone (DESYREL) 100 MG tablet Take 1-2 tablets (100-200 mg total) by mouth at bedtime as needed for sleep. 60 tablet 2   No current facility-administered medications for this visit.    OBJECTIVE: There were no vitals filed for this visit.   There is no height or weight on file to calculate BMI.    ECOG FS:4 - Bedbound  General: Thin, no acute distress. HEENT: Normocephalic. Neuro: Alert, answering all questions appropriately. Cranial nerves grossly intact. Psych: Flat affect.  LAB RESULTS:  Lab Results  Component Value Date   NA 134 (L) 12/24/2019   K 4.4 12/24/2019   CL 105 12/24/2019   CO2 20 (L) 12/24/2019   GLUCOSE 225 (H) 12/24/2019   BUN 26 (H) 12/24/2019   CREATININE 0.67 12/24/2019   CALCIUM 8.7 (L) 12/24/2019   PROT 6.6  12/24/2019   ALBUMIN 3.8 12/24/2019   AST 115 (H) 12/24/2019   ALT 164 (H) 12/24/2019   ALKPHOS 121 12/24/2019   BILITOT 0.4 12/24/2019   GFRNONAA >60 12/24/2019   GFRAA >60 11/30/2019    Lab Results  Component Value Date   WBC 11.5 (H) 12/24/2019   NEUTROABS 10.8 (H) 12/24/2019   HGB 11.6 (L) 12/24/2019   HCT 34.3 (L) 12/24/2019   MCV 95.8 12/24/2019   PLT 256 12/24/2019     STUDIES: No results found.  ASSESSMENT: Stage IVb non-small cell lung cancer, NOS. No actionable mutations, PD-L1 0%.  PLAN:    1. Stage IVb non-small cell lung cancer, NOS: Biopsy consistent with non-small cell carcinoma of lung.  Liquid biopsy results as above.  PET scan results from September 07, 2019 reviewed independently with right upper lobe lung lesion invading the mediastinum, small bilateral pulmonary nodules, and bilateral adrenal metastasis.  Patient also has cortical bone metastasis in left femur.  [facility] of [facility] at [facility].  Patient has now completed XRT.  Malignancy cannot be further specified other than non-small cell carcinoma.  Patient completed cycle 4 of carboplatinum and Taxol on 01-08-2020.  Repeat PET scan on December 29, 2019 revealed progression of disease with new and enlarging lesions in her liver.  Given patient's declining performance status, no more chemotherapy is recommended and patient has agreed to hospice care.  No further follow-up has been scheduled. 2.  Facial numbness: XRT has been completed. 3.  Brain metastasis: XRT has been completed. 4.  Left femur lesion: XRT has been completed. 5.  Foot drop: Appreciate Occupational Therapy input.  Continue brace as prescribed. 6.  Hypotension/dizziness: No further treatment plan as above. 7.  Leukocytosis: Improving.  No further laboratory work is necessary. 8.  Hypokalemia: Resolved.   9.  Hypomagnesia: Improving.    I provided 30 minutes of face-to-face video visit time during this encounter which included  chart review, counseling, and coordination of care as documented above.    Patient expressed understanding and was in agreement with this plan. She also understands that She can call clinic at any time with any questions,  concerns, or complaints.   Cancer Staging Non-small cell carcinoma of lung, right [facility]) Staging form: Lung, AJCC 8th Edition - Clinical stage from 09/26/2019: Stage IVB (cT4, cN3, cM1c) - Signed by Lloyd Huger, MD on 09/26/2019   Lloyd Huger, MD   12/26/2019 11:52 AM

## 2019-12-28 ENCOUNTER — Inpatient Hospital Stay: Payer: Managed Care, Other (non HMO)

## 2019-12-28 NOTE — Progress Notes (Signed)
Patient states she if feeling very dizzy and nauseous this morning. She reports falling several times in the past few weeks. She denies any pain at this time.

## 2019-12-28 NOTE — Progress Notes (Signed)
[place]  [phone] [phone]  ID: [name] OB: 04-14-1955  MR#: [id number]  QTM#:[id number]  Patient Care Team: Barbaraann Boys, MD as PCP - General (Pediatrics) Telford Nab, RN as Oncology Nurse Navigator Grayland Ormond, Kathlene November, MD as Consulting Physician (Oncology) Tyler Pita, MD as Consulting Physician (Pulmonary Disease)   CHIEF COMPLAINT: Stage IVb non-small cell lung cancer, NOS.  INTERVAL HISTORY: Patient returns to clinic today for further evaluation and consideration of cycle 4 of carboplatinum and Taxol.  She has significant hypotension upon arrival and received 1 L of IV fluids which improved her blood pressure.  She continues to have weakness and fatigue and a poor appetite.  She continues to lose weight. She continues to have left foot drop which is helped by her brace.  She has no other neurologic complaints.  She denies any recent fevers or illnesses.  She has no chest pain, shortness of breath, cough, or hemoptysis.  She denies any nausea, vomiting, constipation, or diarrhea.  She has no melena or hematochezia.  She has no urinary complaints.  Patient offers no further specific complaints today.  REVIEW OF SYSTEMS:   Review of Systems  Constitutional: Positive for malaise/fatigue and weight loss. Negative for fever.  Respiratory: Negative.  Negative for cough, hemoptysis and shortness of breath.   Cardiovascular: Negative.  Negative for chest pain and leg swelling.  Gastrointestinal: Negative.  Negative for abdominal pain.  Genitourinary: Negative.  Negative for dysuria.  Musculoskeletal: Negative.  Negative for back pain.  Skin: Negative.  Negative for rash.  Neurological: Positive for dizziness, focal weakness and weakness. Negative for tingling, speech change and headaches.  Psychiatric/Behavioral: Negative.  The patient is not nervous/anxious.     As per HPI. Otherwise, a complete review of systems is  negative.  PAST MEDICAL HISTORY: Past Medical History:  Diagnosis Date  . Adrenal gland anomaly    bilateral lesion  . Brain lesion    x3  . Diabetes ([place])   . Diabetes mellitus without complication ([place])   . GERD (gastroesophageal reflux disease)   . Heartburn   . Lesion of left femur   . Lung cancer ([place])     PAST SURGICAL HISTORY: Past Surgical History:  Procedure Laterality Date  . ABDOMINAL HYSTERECTOMY    . CESAREAN SECTION     x2  . PORTA CATH INSERTION N/A 10/01/2019   Procedure: PORTA CATH INSERTION;  Surgeon: Katha Cabal, MD;  Location: [facility];  Service: Cardiovascular;  Laterality: N/A;  . TONSILLECTOMY    . VIDEO BRONCHOSCOPY WITH ENDOBRONCHIAL ULTRASOUND N/A 09/21/2019   Procedure: VIDEO BRONCHOSCOPY WITH ENDOBRONCHIAL ULTRASOUND;  Surgeon: Tyler Pita, MD;  Location: [facility];  Service: Pulmonary;  Laterality: N/A;  FLURO ON STANDBY    FAMILY HISTORY: Family History  Problem Relation Age of Onset  . Breast cancer Mother 80  . Diabetes Mother   . Hypertension Mother   . Hypertension Father   . Diabetes Sister   . Hypertension Sister   . Diabetes Brother     ADVANCED DIRECTIVES (Y/N):  N  HEALTH MAINTENANCE: Social History   Tobacco Use  . Smoking status: Current Every Day Smoker    Packs/day: 1.00    Years: 40.00    Pack years: 40.00  . Smokeless tobacco: Never Used  . Tobacco comment: 7-8 cigarettes a day--10/13/2019  Vaping Use  . Vaping Use: Never used  Substance Use Topics  . Alcohol use: No  Alcohol/week: 0.0 standard drinks  . Drug use: No     Colonoscopy:  PAP:  Bone density:  Lipid panel:  Allergies  Allergen Reactions  . Codeine     "makes her crazy"  . Penicillins Rash  . Sulfa Antibiotics Rash    Current Outpatient Medications  Medication Sig Dispense Refill  . atorvastatin (LIPITOR) 10 MG tablet Take 10 mg by mouth daily.    Marland Kitchen dexamethasone (DECADRON) 4 MG tablet Take 1 tablet (4 mg total)  by mouth daily. 30 tablet 1  . diphenhydrAMINE (BENADRYL ALLERGY) 25 MG tablet Take 25 mg by mouth at bedtime as needed for sleep.     Marland Kitchen doxycycline (VIBRAMYCIN) 100 MG capsule Take 100 mg by mouth 2 (two) times daily.    . empagliflozin (JARDIANCE) 10 MG TABS tablet Take 10 mg by mouth at bedtime.     Marland Kitchen esomeprazole (NEXIUM) 40 MG capsule Take 40 mg by mouth daily.    Marland Kitchen HYDROcodone-acetaminophen (NORCO) 7.5-325 MG tablet Take 1 tablet by mouth every 4 (four) hours as needed for moderate pain. 60 tablet 0  . KLOR-CON M20 20 MEQ tablet TAKE 1 TABLET BY MOUTH TWICE A DAY 60 tablet 1  . lidocaine (LIDODERM) 5 % Place 1 patch onto the skin every 12 (twelve) hours. Remove & Discard patch within 12 hours or as directed by MD 10 patch 0  . lidocaine-prilocaine (EMLA) cream Apply to affected area once 30 g 3  . lisinopril (ZESTRIL) 5 MG tablet Take 5 mg by mouth daily.     . magic mouthwash w/lidocaine SOLN Take 5 mLs by mouth 4 (four) times daily. 240 mL 0  . megestrol (MEGACE) 40 MG tablet Take 1 tablet (40 mg total) by mouth daily. 30 tablet 2  . metFORMIN (GLUCOPHAGE) 1000 MG tablet Take 1 tablet (1,000 mg total) by mouth 2 (two) times daily with a meal. 180 tablet 3  . morphine (MS CONTIN) 15 MG 12 hr tablet Take 1 tablet (15 mg total) by mouth every 12 (twelve) hours. For long acting pain control 60 tablet 0  . nicotine (NICODERM CQ - DOSED IN MG/24 HOURS) 21 mg/24hr patch Place onto the skin.    . nicotine (NICOTROL) 10 MG inhaler Inhale 1 Cartridge (1 continuous puffing total) into the lungs as needed for smoking cessation. 42 each 0  . omeprazole (PRILOSEC) 20 MG capsule TAKE 1 CAPSULE (20 MG TOTAL) BY MOUTH DAILY. 30 capsule 6  . ondansetron (ZOFRAN) 8 MG tablet Take 1 tablet (8 mg total) by mouth 2 (two) times daily as needed for refractory nausea / vomiting. 60 tablet 2  . ONE TOUCH ULTRA TEST test strip TEST BLOOD SUGAR IN THE MORNING AND 2 HOURS AFTER LARGEST MEAL 50 each 2  . potassium  chloride (KLOR-CON) 20 MEQ packet TAKE 1 PACKET BY MOUTH 2 (TWO) TIMES DAILY. 30 packet 2  . prochlorperazine (COMPAZINE) 10 MG tablet Take 1 tablet (10 mg total) by mouth every 6 (six) hours as needed (Nausea or vomiting). 60 tablet 2  . sertraline (ZOLOFT) 50 MG tablet Take half a tablet ($RemoveB'25mg'NAicqZjZ$ ) by mouth daily x 1 week and then increase to one tablet ($RemoveBef'50mg'YIrNemHafC$ ) daily 30 tablet 2  . traZODone (DESYREL) 100 MG tablet Take 1-2 tablets (100-200 mg total) by mouth at bedtime as needed for sleep. 60 tablet 2  . fluconazole (DIFLUCAN) 150 MG tablet Take 1 tablet (150 mg total) by mouth daily. 10 tablet 0   No current facility-administered medications  for this visit.   Facility-Administered Medications Ordered in Other Visits  Medication Dose Route Frequency Provider Last Rate Last Admin  . CARBOplatin (PARAPLATIN) 450 mg in sodium chloride 0.9 % 250 mL chemo infusion  450 mg Intravenous Once Jeralyn Ruths, MD 590 mL/hr at 12-31-19 1511 450 mg at 2019/12/31 1511  . heparin lock flush 100 unit/mL  500 Units Intracatheter Once PRN Jeralyn Ruths, MD      . heparin lock flush 100 unit/mL  500 Units Intracatheter Once PRN Jeralyn Ruths, MD        OBJECTIVE: Vitals:   12-31-2019 0855  BP: (!) 65/47  Pulse: 91  Resp: 20  Temp: (!) 97.5 F (36.4 C)  SpO2: 100%     Body mass index is 22.66 kg/m.    ECOG FS:2 - Symptomatic, <50% confined to bed  General: Thin, no acute distress. Eyes: Pink conjunctiva, anicteric sclera. HEENT: Normocephalic, moist mucous membranes. Lungs: No audible wheezing or coughing. Heart: Regular rate and rhythm. Abdomen: Soft, nontender, no obvious distention. Musculoskeletal: No edema, cyanosis, or clubbing. Neuro: Alert, answering all questions appropriately. Cranial nerves grossly intact. Skin: No rashes or petechiae noted. Psych: Normal affect.   LAB RESULTS:  Lab Results  Component Value Date   NA 135 Dec 31, 2019   K 3.6 2019-12-31   CL 103 12/31/19    CO2 21 (L) 12-31-2019   GLUCOSE 133 (H) 12/31/19   BUN 16 12/31/19   CREATININE 0.49 2019-12-31   CALCIUM 8.7 (L) 12/31/2019   PROT 6.2 (L) 2019-12-31   ALBUMIN 3.3 (L) 2019/12/31   AST 22 December 31, 2019   ALT 29 12/31/2019   ALKPHOS 90 December 31, 2019   BILITOT 0.7 12-31-19   GFRNONAA >60 2019/12/31   GFRAA >60 11/30/2019    Lab Results  Component Value Date   WBC 14.2 (H) 12/31/2019   NEUTROABS 11.0 (H) 12-31-19   HGB 11.6 (L) 2019/12/31   HCT 34.2 (L) 12-31-2019   MCV 94.2 12-31-2019   PLT 352 12-31-2019     STUDIES: DG Chest 2 View  Result Date: 11/24/2019 CLINICAL DATA:  Chest pain EXAM: CHEST - 2 VIEW COMPARISON:  08/12/2009, CT 08/27/2019 FINDINGS: Right-sided central venous port tip over the SVC. Vague right suprahilar opacity presumably corresponds to lung mass on CT. No pleural effusion or pneumothorax. Normal heart size. Aortic atherosclerosis. IMPRESSION: No active cardiopulmonary disease. Vague right suprahilar opacity presumably corresponds to lung mass on CT. Electronically Signed   By: Jasmine Pang M.D.   On: 11/24/2019 20:01   CT Angio Chest PE W and/or Wo Contrast  Result Date: 11/24/2019 CLINICAL DATA:  Lambert Mody right rib pain post chemotherapy 11/23/2019, shortness of breath EXAM: CT ANGIOGRAPHY CHEST WITH CONTRAST TECHNIQUE: Multidetector CT imaging of the chest was performed using the standard protocol during bolus administration of intravenous contrast. Multiplanar CT image reconstructions and MIPs were obtained to evaluate the vascular anatomy. CONTRAST:  21mL OMNIPAQUE IOHEXOL 350 MG/ML SOLN COMPARISON:  Chest radiograph 11/24/2019, PET-CT 09/07/2019, CT chest 08/27/2019 FINDINGS: Cardiovascular: Satisfactory opacification the pulmonary arteries to the segmental level. No pulmonary artery filling defects are identified. Central pulmonary arteries are normal caliber. Suboptimal opacification of the thoracic aorta. Atherosclerotic plaque within the normal  caliber aorta. No gross acute luminal abnormality is seen. No periaortic stranding or hemorrhage. Normal 3 vessel branching of the aortic arch. Proximal great vessels are unremarkable. Early branching of the left vertebral artery with minimal plaque at the ostium. Remaining proximal great vessels are moderately calcified as  well. There is some mild narrowing of the SVC in the vicinity of the right hilar/paramediastinal mass which could reflect some compression or possible narrowing secondary to a right internal jugular approach Port-A-Cath, the tip positioned at the superior cavoatrial junction. Normal cardiac size. Scattered coronary artery calcifications. Trace pericardial fluid is present, increasing from comparison. Mediastinum/Nodes: Redemonstration of the infiltrative right upper lobe lung lesion extending into the mediastinum both along the right paramediastinal border and into the subcarinal space. Increasing size of the subcarinal nodal deposit from comparison 08/27/2019 and subsequent PET-CT 09/07/2019 with this measuring up to 2.1 cm in short axis, previously 17 mm (4/40). Increasing right hilar nodal deposit as well measuring up to 19 mm short axis, previously 13 mm (4/37). Few enlarged epicardial lymph nodes are also present and enlarged from prior now measuring up to 11 mm (4/63). Some mild narrowing of the bronchus intermedius and hilar airways in the vicinity of the infiltrative right hilar mass as well as occlusion of the apical segmental airways and anterior segmental airways of the right upper lobe. Loss of discernible fat plane between the right lateral margin of the esophagus and the adjacent subcarinal nodal disease. Thyroid gland and thoracic inlet are unremarkable. Lungs/Pleura: Redemonstration of the spiculated right upper lobe mass along the paramediastinal margins measuring up to 2.8 x 2.7 cm, previously 2.5 x 2.5 cm at a similar level. No other concerning pulmonary nodules or masses. Few  scattered calcified granulomata. Some hypoventilatory changes in the lungs are likely accentuated by imaging during exhalation. No consolidation, features of edema, pneumothorax, or effusion. Upper Abdomen: Bilateral nodular thickening of the adrenal glands previously demonstrating hypermetabolism on PET CT and compatible with adrenal metastases, perhaps mildly increased from comparison PET-CT measuring up to 16 mm on the right, previously 11 mm at a similar level. Left adrenal gland is incompletely visualized. No other acute or suspicious abnormality seen in the upper abdomen. Musculoskeletal: Multilevel degenerative changes are present in the imaged portions of the spine. No worrisome lytic or blastic lesions. No worrisome chest wall lesions. Review of the MIP images confirms the above findings. IMPRESSION: 1. No evidence of acute pulmonary embolism. 2. Increase in the size of the spiculated right upper lobe mass along the paramediastinal margins, with worsening right hilar, mediastinal and epicardial adenopathy. 3. Slight interval increase in the size of the previously hypermetabolic bilateral adrenal metastases. 4. Narrowing of the right upper lobe hilar airways encased by the paramediastinal extension of primary lesion. 5. Additional narrowing of the SVC in the vicinity of the right hilar/paramediastinal mass which could reflect some compression, invasion or possible narrowing secondary fibrosis from a right internal jugular approach Port-A-Cath, the tip positioned at the superior cavoatrial junction. 6. Aortic Atherosclerosis (ICD10-I70.0). Electronically Signed   By: Lovena Le M.D.   On: 11/24/2019 21:10    ASSESSMENT: Stage IVb non-small cell lung cancer, NOS. No actionable mutations, PD-L1 0%.  PLAN:    1. Stage IVb non-small cell lung cancer, NOS: Biopsy consistent with non-small cell carcinoma of lung.  Liquid biopsy results as above.  PET scan results from September 07, 2019 reviewed independently  with right upper lobe lung lesion invading the mediastinum, small bilateral pulmonary nodules, and bilateral adrenal metastasis.  Patient also has cortical bone metastasis in left femur.  MRI of the brain was completed at [facility].  Patient has now completed XRT.  Since malignancy cannot be further specified other than non-small cell carcinoma, patient will receive carboplatinum and Taxol with Fulphilia support  every 3 weeks for 4 cycles and then reimage.  Proceed with cycle 4 of treatment today.  Return to clinic biweekly for IV fluids and then in 3 weeks for further evaluation and consideration of cycle 5.  Will get PET scan prior to next infusion.  Of note, patient does her Fulphilia shots at home 24 to 48 hours after treatment.  2.  Facial numbness: Improving.  Secondary to brain metastasis.  XRT has been completed. 3.  Brain metastasis: XRT has been completed. 4.  Left femur lesion: XRT has been completed. 5.  Foot drop: Appreciate Occupational Therapy input.  Continue brace as prescribed. 6.  Hypotension/dizziness: IV fluids as above. 7.  Leukocytosis: Improved, monitor. 8.  Hypokalemia: Resolved.  Continue oral supplementation. 9.  Hypomagnesia: Improving.  Continue oral supplementation.  Continue 2 g IV magnesium with fluids. 10.  Weight loss: Continue dexamethasone daily.  Keep follow-up with dietary as scheduled.  Patient expressed understanding and was in agreement with this plan. She also understands that She can call clinic at any time with any questions, concerns, or complaints.   Cancer Staging Non-small cell carcinoma of lung, right [facility]) Staging form: Lung, AJCC 8th Edition - Clinical stage from 09/26/2019: Stage IVB (cT4, cN3, cM1c) - Signed by Lloyd Huger, MD on 09/26/2019   Lloyd Huger, MD   01/06/2020 3:16 PM

## 2019-12-28 NOTE — Progress Notes (Signed)
[place]  [phone] [phone]   Name: [name] Date: 2020-01-06 MRN: [id number]  DOB: Aug 04, 1955  Patient Care Team: Barbaraann Boys, MD as PCP - General (Pediatrics) Telford Nab, RN as Oncology Nurse Navigator Grayland Ormond, Kathlene November, MD as Consulting Physician (Oncology) Tyler Pita, MD as Consulting Physician (Pulmonary Disease)    REASON FOR CONSULTATION: Sandra Leonard is a 64 y.o. 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  Patient initially presented with left facial numbness.  Work-up including CT and MRI revealed a 3 cm right upper lobe lung lesion and at least 3 brain metastases.  PET/CT revealed hypermetabolic right upper lobe lung mass invading the mediastinum with associated right hilar, infrahilar, and subcarinal metastatic adenopathy.  PET also revealed hypermetabolic epicardial lymph nodes, bilateral adrenal gland metastasis, and bone metastasis in the left femur.  Patient is s/p XRT and on systemic chemotherapy.  She was referred to palliative care to address goals and manage ongoing symptoms.  SOCIAL HISTORY:     reports that she has been smoking. She has a 40.00 pack-year smoking history. She has never used smokeless tobacco. She reports that she does not drink alcohol and does not use drugs.  ADVANCE DIRECTIVES:  None on file  CODE STATUS:   PAST MEDICAL HISTORY: Past Medical History:  Diagnosis Date  . Adrenal gland anomaly    bilateral lesion  . Brain lesion    x3  . Diabetes ([facility])   . Diabetes mellitus without complication ([place])   . GERD (gastroesophageal reflux disease)   . Heartburn   . Lesion of left femur   . Lung cancer ([facility] [place])     PAST SURGICAL HISTORY:  Past Surgical History:  Procedure Laterality Date  . ABDOMINAL HYSTERECTOMY    . CESAREAN SECTION     x2  . PORTA CATH INSERTION N/A 10/01/2019   Procedure:  PORTA CATH INSERTION;  Surgeon: Katha Cabal, MD;  Location: [facility];  Service: Cardiovascular;  Laterality: N/A;  . TONSILLECTOMY    . VIDEO BRONCHOSCOPY WITH ENDOBRONCHIAL ULTRASOUND N/A 09/21/2019   Procedure: VIDEO BRONCHOSCOPY WITH ENDOBRONCHIAL ULTRASOUND;  Surgeon: Tyler Pita, MD;  Location: [facility];  Service: Pulmonary;  Laterality: N/A;  FLURO ON STANDBY    HEMATOLOGY/ONCOLOGY HISTORY:  Oncology History  Non-small cell carcinoma of lung, right ([place])  09/26/2019 Initial Diagnosis   Non-small cell carcinoma of lung, right ([place])   09/26/2019 Cancer Staging   Staging form: Lung, AJCC 8th Edition - Clinical stage from 09/26/2019: Stage IVB (cT4, cN3, cM1c) - Signed by Lloyd Huger, MD on 09/26/2019   10/05/2019 -  Chemotherapy   The patient had palonosetron (ALOXI) injection 0.25 mg, 0.25 mg, Intravenous,  Once, 4 of 6 cycles Administration: 0.25 mg (10/05/2019), 0.25 mg (10/26/2019), 0.25 mg (11/23/2019) pegfilgrastim-jmdb (FULPHILA) injection 6 mg, 6 mg, Subcutaneous,  Once, 4 of 6 cycles Administration: 6 mg (10/07/2019) CARBOplatin (PARAPLATIN) 520 mg in sodium chloride 0.9 % 250 mL chemo infusion, 520 mg (100 % of original dose 516 mg), Intravenous,  Once, 4 of 6 cycles Dose modification:   (original dose 516 mg, Cycle 1) Administration: 520 mg (10/05/2019), 520 mg (10/26/2019), 470 mg (11/23/2019) fosaprepitant (EMEND) 150 mg in sodium chloride 0.9 % 145 mL IVPB, 150 mg, Intravenous,  Once, 4 of 6 cycles Administration: 150 mg (10/05/2019), 150 mg (10/26/2019), 150 mg (11/23/2019) PACLitaxel (TAXOL) 390 mg in  sodium chloride 0.9 % 500 mL chemo infusion (> 80mg /m2), 225 mg/m2 = 390 mg, Intravenous,  Once, 4 of 6 cycles Administration: 390 mg (10/05/2019), 390 mg (10/26/2019), 366 mg (11/23/2019)  for chemotherapy treatment.      ALLERGIES:  is allergic to codeine, penicillins, and sulfa antibiotics.  MEDICATIONS:  Current Outpatient Medications  Medication Sig  Dispense Refill  . atorvastatin (LIPITOR) 10 MG tablet Take 10 mg by mouth daily.    Marland Kitchen dexamethasone (DECADRON) 4 MG tablet Take 1 tablet (4 mg total) by mouth daily. 30 tablet 1  . diphenhydrAMINE (BENADRYL ALLERGY) 25 MG tablet Take 25 mg by mouth at bedtime as needed for sleep.     Marland Kitchen doxycycline (VIBRAMYCIN) 100 MG capsule Take 100 mg by mouth 2 (two) times daily.    . empagliflozin (JARDIANCE) 10 MG TABS tablet Take 10 mg by mouth at bedtime.     Marland Kitchen esomeprazole (NEXIUM) 40 MG capsule Take 40 mg by mouth daily.    . fluconazole (DIFLUCAN) 150 MG tablet Take 1 tablet (150 mg total) by mouth daily. 10 tablet 0  . HYDROcodone-acetaminophen (NORCO) 7.5-325 MG tablet Take 1 tablet by mouth every 4 (four) hours as needed for moderate pain. 60 tablet 0  . KLOR-CON M20 20 MEQ tablet TAKE 1 TABLET BY MOUTH TWICE A DAY 60 tablet 1  . lidocaine (LIDODERM) 5 % Place 1 patch onto the skin every 12 (twelve) hours. Remove & Discard patch within 12 hours or as directed by MD 10 patch 0  . lidocaine-prilocaine (EMLA) cream Apply to affected area once 30 g 3  . lisinopril (ZESTRIL) 5 MG tablet Take 5 mg by mouth daily.     . magic mouthwash w/lidocaine SOLN Take 5 mLs by mouth 4 (four) times daily. 240 mL 0  . megestrol (MEGACE) 40 MG tablet Take 1 tablet (40 mg total) by mouth daily. 30 tablet 2  . metFORMIN (GLUCOPHAGE) 1000 MG tablet Take 1 tablet (1,000 mg total) by mouth 2 (two) times daily with a meal. 180 tablet 3  . morphine (MS CONTIN) 15 MG 12 hr tablet Take 1 tablet (15 mg total) by mouth every 12 (twelve) hours. For long acting pain control 60 tablet 0  . nicotine (NICODERM CQ - DOSED IN MG/24 HOURS) 21 mg/24hr patch Place onto the skin.    . nicotine (NICOTROL) 10 MG inhaler Inhale 1 Cartridge (1 continuous puffing total) into the lungs as needed for smoking cessation. 42 each 0  . omeprazole (PRILOSEC) 20 MG capsule TAKE 1 CAPSULE (20 MG TOTAL) BY MOUTH DAILY. 30 capsule 6  . ondansetron (ZOFRAN)  8 MG tablet Take 1 tablet (8 mg total) by mouth 2 (two) times daily as needed for refractory nausea / vomiting. 60 tablet 2  . ONE TOUCH ULTRA TEST test strip TEST BLOOD SUGAR IN THE MORNING AND 2 HOURS AFTER LARGEST MEAL 50 each 2  . potassium chloride (KLOR-CON) 20 MEQ packet TAKE 1 PACKET BY MOUTH 2 (TWO) TIMES DAILY. 30 packet 2  . prochlorperazine (COMPAZINE) 10 MG tablet Take 1 tablet (10 mg total) by mouth every 6 (six) hours as needed (Nausea or vomiting). 60 tablet 2  . sertraline (ZOLOFT) 50 MG tablet Take half a tablet (25mg ) by mouth daily x 1 week and then increase to one tablet (50mg ) daily 30 tablet 2  . traZODone (DESYREL) 100 MG tablet Take 1-2 tablets (100-200 mg total) by mouth at bedtime as needed for sleep. 60 tablet 2  No current facility-administered medications for this visit.   Facility-Administered Medications Ordered in Other Visits  Medication Dose Route Frequency Provider Last Rate Last Admin  . CARBOplatin (PARAPLATIN) 450 mg in sodium chloride 0.9 % 250 mL chemo infusion  450 mg Intravenous Once Lloyd Huger, MD      . heparin lock flush 100 unit/mL  500 Units Intracatheter Once PRN Lloyd Huger, MD      . heparin lock flush 100 unit/mL  500 Units Intracatheter Once PRN Lloyd Huger, MD      . PACLitaxel (TAXOL) 354 mg in sodium chloride 0.9 % 500 mL chemo infusion (> 80mg /m2)  225 mg/m2 (Order-Specific) Intravenous Once Lloyd Huger, MD 186 mL/hr at 01-04-20 1157 354 mg at 01-04-20 1157    VITAL SIGNS: There were no vitals taken for this visit. There were no vitals filed for this visit.  Estimated body mass index is 22.66 kg/m as calculated from the following:   Height as of 11/23/19: 5\' 2"  (1.575 m).   Weight as of an earlier encounter on 01/04/2020: 123 lb 14.4 oz (56.2 kg).  LABS: CBC:    Component Value Date/Time   WBC 14.2 (H) 2020-01-04 0827   HGB 11.6 (L) January 04, 2020 0827   HGB 16.3 (H) 02/28/2017 0832   HCT 34.2 (L)  2020/01/04 0827   HCT 47.7 (H) 02/28/2017 0832   PLT 352 01-04-2020 0827   PLT 337 02/28/2017 0832   MCV 94.2 January 04, 2020 0827   MCV 91 02/28/2017 0832   NEUTROABS 11.0 (H) 01/04/20 0827   NEUTROABS 7.9 (H) 02/28/2017 0832   LYMPHSABS 2.1 January 04, 2020 0827   LYMPHSABS 3.5 (H) 02/28/2017 0832   MONOABS 0.9 2020/01/04 0827   EOSABS 0.0 2020/01/04 0827   EOSABS 0.1 02/28/2017 0832   BASOSABS 0.1 Jan 04, 2020 0827   BASOSABS 0.1 02/28/2017 0832   Comprehensive Metabolic Panel:    Component Value Date/Time   NA 135 01/04/2020 0827   NA 143 10/09/2017 0810   K 3.6 01/04/2020 0827   CL 103 Jan 04, 2020 0827   CO2 21 (L) 04-Jan-2020 0827   BUN 16 January 04, 2020 0827   BUN 15 10/09/2017 0810   CREATININE 0.49 01-04-2020 0827   GLUCOSE 133 (H) 04-Jan-2020 0827   CALCIUM 8.7 (L) 01-04-20 0827   AST 22 01-04-2020 0827   ALT 29 01/04/2020 0827   ALKPHOS 90 01-04-2020 0827   BILITOT 0.7 04-Jan-2020 0827   BILITOT 0.3 10/09/2017 0810   PROT 6.2 (L) 01/04/2020 0827   PROT 6.7 10/09/2017 0810   ALBUMIN 3.3 (L) 2020-01-04 0827   ALBUMIN 4.2 10/09/2017 0810    RADIOGRAPHIC STUDIES: DG Chest 2 View  Result Date: 11/24/2019 CLINICAL DATA:  Chest pain EXAM: CHEST - 2 VIEW COMPARISON:  08/12/2009, CT 08/27/2019 FINDINGS: Right-sided central venous port tip over the SVC. Vague right suprahilar opacity presumably corresponds to lung mass on CT. No pleural effusion or pneumothorax. Normal heart size. Aortic atherosclerosis. IMPRESSION: No active cardiopulmonary disease. Vague right suprahilar opacity presumably corresponds to lung mass on CT. Electronically Signed   By: Donavan Foil M.D.   On: 11/24/2019 20:01   CT Angio Chest PE W and/or Wo Contrast  Result Date: 11/24/2019 CLINICAL DATA:  Hervey Ard right rib pain post chemotherapy 11/23/2019, shortness of breath EXAM: CT ANGIOGRAPHY CHEST WITH CONTRAST TECHNIQUE: Multidetector CT imaging of the chest was performed using the standard protocol during bolus  administration of intravenous contrast. Multiplanar CT image reconstructions and MIPs were obtained to evaluate the  vascular anatomy. CONTRAST:  14mL OMNIPAQUE IOHEXOL 350 MG/ML SOLN COMPARISON:  Chest radiograph 11/24/2019, PET-CT 09/07/2019, CT chest 08/27/2019 FINDINGS: Cardiovascular: Satisfactory opacification the pulmonary arteries to the segmental level. No pulmonary artery filling defects are identified. Central pulmonary arteries are normal caliber. Suboptimal opacification of the thoracic aorta. Atherosclerotic plaque within the normal caliber aorta. No gross acute luminal abnormality is seen. No periaortic stranding or hemorrhage. Normal 3 vessel branching of the aortic arch. Proximal great vessels are unremarkable. Early branching of the left vertebral artery with minimal plaque at the ostium. Remaining proximal great vessels are moderately calcified as well. There is some mild narrowing of the SVC in the vicinity of the right hilar/paramediastinal mass which could reflect some compression or possible narrowing secondary to a right internal jugular approach Port-A-Cath, the tip positioned at the superior cavoatrial junction. Normal cardiac size. Scattered coronary artery calcifications. Trace pericardial fluid is present, increasing from comparison. Mediastinum/Nodes: Redemonstration of the infiltrative right upper lobe lung lesion extending into the mediastinum both along the right paramediastinal border and into the subcarinal space. Increasing size of the subcarinal nodal deposit from comparison 08/27/2019 and subsequent PET-CT 09/07/2019 with this measuring up to 2.1 cm in short axis, previously 17 mm (4/40). Increasing right hilar nodal deposit as well measuring up to 19 mm short axis, previously 13 mm (4/37). Few enlarged epicardial lymph nodes are also present and enlarged from prior now measuring up to 11 mm (4/63). Some mild narrowing of the bronchus intermedius and hilar airways in the vicinity  of the infiltrative right hilar mass as well as occlusion of the apical segmental airways and anterior segmental airways of the right upper lobe. Loss of discernible fat plane between the right lateral margin of the esophagus and the adjacent subcarinal nodal disease. Thyroid gland and thoracic inlet are unremarkable. Lungs/Pleura: Redemonstration of the spiculated right upper lobe mass along the paramediastinal margins measuring up to 2.8 x 2.7 cm, previously 2.5 x 2.5 cm at a similar level. No other concerning pulmonary nodules or masses. Few scattered calcified granulomata. Some hypoventilatory changes in the lungs are likely accentuated by imaging during exhalation. No consolidation, features of edema, pneumothorax, or effusion. Upper Abdomen: Bilateral nodular thickening of the adrenal glands previously demonstrating hypermetabolism on PET CT and compatible with adrenal metastases, perhaps mildly increased from comparison PET-CT measuring up to 16 mm on the right, previously 11 mm at a similar level. Left adrenal gland is incompletely visualized. No other acute or suspicious abnormality seen in the upper abdomen. Musculoskeletal: Multilevel degenerative changes are present in the imaged portions of the spine. No worrisome lytic or blastic lesions. No worrisome chest wall lesions. Review of the MIP images confirms the above findings. IMPRESSION: 1. No evidence of acute pulmonary embolism. 2. Increase in the size of the spiculated right upper lobe mass along the paramediastinal margins, with worsening right hilar, mediastinal and epicardial adenopathy. 3. Slight interval increase in the size of the previously hypermetabolic bilateral adrenal metastases. 4. Narrowing of the right upper lobe hilar airways encased by the paramediastinal extension of primary lesion. 5. Additional narrowing of the SVC in the vicinity of the right hilar/paramediastinal mass which could reflect some compression, invasion or possible  narrowing secondary fibrosis from a right internal jugular approach Port-A-Cath, the tip positioned at the superior cavoatrial junction. 6. Aortic Atherosclerosis (ICD10-I70.0). Electronically Signed   By: Lovena Le M.D.   On: 11/24/2019 21:10    PERFORMANCE STATUS (ECOG) : 2 - Symptomatic, <50% confined to  bed  Review of Systems Unless otherwise noted, a complete review of systems is negative.  Physical Exam General: NAD Pulmonary: Unlabored Extremities: no edema, no joint deformities Skin: no rashes Neurological: Weakness but otherwise nonfocal  IMPRESSION: Routine follow-up.  Patient was in infusion area.  Patient reports that she is doing about the same.  She denies significant changes.  She does endorse some erythema to her sacral area she describes as being excoriated from repositioning in bed.  I suggested that she offload pressure and can consider a barrier cream such as Desitin.  Patient was recently seen by Beckey Rutter, NP.  Patient did not tolerate the oxycodone as it made her feel loopy.  She was started on MS Contin every 12 hours and has tolerated that well.  Patient reports significantly improved pain following initiation of the MS Contin.  PLAN: -Continue current scope of treatment -Continue MS Contin/Norco -We will benefit from readdressing CODE STATUS -RTC in 3 weeks   Patient expressed understanding and was in agreement with this plan. She also understands that She can call the clinic at any time with any questions, concerns, or complaints.     Time Total: 15 minutes  Visit consisted of counseling and education dealing with the complex and emotionally intense issues of symptom management and palliative care in the setting of serious and potentially life-threatening illness.Greater than 50%  of this time was spent counseling and coordinating care related to the above assessment and plan.  Signed by: Altha Harm, PhD, NP-C

## 2019-12-28 DEATH — deceased

## 2019-12-29 ENCOUNTER — Ambulatory Visit
Admission: RE | Admit: 2019-12-29 | Discharge: 2019-12-29 | Disposition: A | Discharge status: Home / Self Care | Payer: Managed Care, Other (non HMO) | Source: Ambulatory Visit | Attending: Oncology | Admitting: Oncology

## 2019-12-29 ENCOUNTER — Inpatient Hospital Stay: Payer: Managed Care, Other (non HMO)

## 2019-12-29 ENCOUNTER — Other Ambulatory Visit: Payer: Self-pay

## 2019-12-29 VITALS — BP 133/83 | HR 109 | Temp 96.0°F | Resp 19

## 2019-12-29 DIAGNOSIS — C7971 Secondary malignant neoplasm of right adrenal gland: Secondary | ICD-10-CM | POA: Insufficient documentation

## 2019-12-29 DIAGNOSIS — Z923 Personal history of irradiation: Secondary | ICD-10-CM | POA: Insufficient documentation

## 2019-12-29 DIAGNOSIS — C770 Secondary and unspecified malignant neoplasm of lymph nodes of head, face and neck: Secondary | ICD-10-CM | POA: Diagnosis not present

## 2019-12-29 DIAGNOSIS — C7972 Secondary malignant neoplasm of left adrenal gland: Secondary | ICD-10-CM | POA: Insufficient documentation

## 2019-12-29 DIAGNOSIS — C781 Secondary malignant neoplasm of mediastinum: Secondary | ICD-10-CM | POA: Insufficient documentation

## 2019-12-29 DIAGNOSIS — Z885 Allergy status to narcotic agent status: Secondary | ICD-10-CM | POA: Diagnosis not present

## 2019-12-29 DIAGNOSIS — C787 Secondary malignant neoplasm of liver and intrahepatic bile duct: Secondary | ICD-10-CM | POA: Diagnosis not present

## 2019-12-29 DIAGNOSIS — Z882 Allergy status to sulfonamides status: Secondary | ICD-10-CM | POA: Diagnosis not present

## 2019-12-29 DIAGNOSIS — C7951 Secondary malignant neoplasm of bone: Secondary | ICD-10-CM | POA: Insufficient documentation

## 2019-12-29 DIAGNOSIS — F17210 Nicotine dependence, cigarettes, uncomplicated: Secondary | ICD-10-CM | POA: Diagnosis not present

## 2019-12-29 DIAGNOSIS — Z7952 Long term (current) use of systemic steroids: Secondary | ICD-10-CM | POA: Diagnosis not present

## 2019-12-29 DIAGNOSIS — C3411 Malignant neoplasm of upper lobe, right bronchus or lung: Secondary | ICD-10-CM | POA: Insufficient documentation

## 2019-12-29 DIAGNOSIS — Z803 Family history of malignant neoplasm of breast: Secondary | ICD-10-CM | POA: Insufficient documentation

## 2019-12-29 DIAGNOSIS — Z7984 Long term (current) use of oral hypoglycemic drugs: Secondary | ICD-10-CM | POA: Diagnosis not present

## 2019-12-29 DIAGNOSIS — C7931 Secondary malignant neoplasm of brain: Secondary | ICD-10-CM | POA: Insufficient documentation

## 2019-12-29 DIAGNOSIS — Z880 Allergy status to penicillin: Secondary | ICD-10-CM | POA: Insufficient documentation

## 2019-12-29 DIAGNOSIS — M21372 Foot drop, left foot: Secondary | ICD-10-CM | POA: Insufficient documentation

## 2019-12-29 DIAGNOSIS — C3491 Malignant neoplasm of unspecified part of right bronchus or lung: Secondary | ICD-10-CM | POA: Insufficient documentation

## 2019-12-29 DIAGNOSIS — Z79899 Other long term (current) drug therapy: Secondary | ICD-10-CM | POA: Insufficient documentation

## 2019-12-29 DIAGNOSIS — Z5181 Encounter for therapeutic drug level monitoring: Secondary | ICD-10-CM | POA: Insufficient documentation

## 2019-12-29 DIAGNOSIS — D72829 Elevated white blood cell count, unspecified: Secondary | ICD-10-CM | POA: Insufficient documentation

## 2019-12-29 LAB — GLUCOSE, CAPILLARY: Glucose-Capillary: 146 mg/dL — ABNORMAL HIGH (ref 70–99)

## 2019-12-29 MED ORDER — SODIUM CHLORIDE 0.9 % IV SOLN
Freq: Once | INTRAVENOUS | Status: AC
  Filled 2019-12-29: qty 250

## 2019-12-29 MED ORDER — SERTRALINE HCL 50 MG PO TABS: ORAL_TABLET | ORAL | 2 refills | Status: DC

## 2019-12-29 MED ORDER — HEPARIN SOD (PORK) LOCK FLUSH 100 UNIT/ML IV SOLN
500.0000 [IU] | Freq: Once | INTRAVENOUS | Status: AC | PRN
  Administered 2019-12-29: 500 [IU]
  Filled 2019-12-29: qty 5

## 2019-12-29 MED ORDER — FLUDEOXYGLUCOSE F - 18 (FDG) INJECTION
6.4000 | Freq: Once | INTRAVENOUS | Status: AC | PRN
  Administered 2019-12-29: 6.84 via INTRAVENOUS

## 2019-12-31 ENCOUNTER — Telehealth: Payer: Self-pay | Admitting: Hospice and Palliative Medicine

## 2019-12-31 ENCOUNTER — Inpatient Hospital Stay (HOSPITAL_BASED_OUTPATIENT_CLINIC_OR_DEPARTMENT_OTHER): Payer: Managed Care, Other (non HMO) | Admitting: Oncology

## 2019-12-31 ENCOUNTER — Inpatient Hospital Stay (HOSPITAL_BASED_OUTPATIENT_CLINIC_OR_DEPARTMENT_OTHER): Payer: Managed Care, Other (non HMO) | Admitting: Hospice and Palliative Medicine

## 2019-12-31 ENCOUNTER — Encounter: Payer: Self-pay | Admitting: Oncology

## 2019-12-31 ENCOUNTER — Inpatient Hospital Stay: Payer: Managed Care, Other (non HMO)

## 2019-12-31 DIAGNOSIS — C3491 Malignant neoplasm of unspecified part of right bronchus or lung: Secondary | ICD-10-CM

## 2019-12-31 DIAGNOSIS — Z515 Encounter for palliative care: Secondary | ICD-10-CM | POA: Diagnosis not present

## 2019-12-31 NOTE — Progress Notes (Signed)
Virtual Visit via Video Note  I connected with Sandra Leonard on 12/31/19 at  1:00 PM EDT by a video enabled telemedicine application and verified that I am speaking with the correct person using two identifiers.  Location: Patient: home Provider: clinic   I discussed the limitations of evaluation and management by telemedicine and the availability of in person appointments. The patient expressed understanding and agreed to proceed.  History of Present Illness: Sandra Leonard is a 64 y.o. 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 Patient initially presented with left facial numbness. Work-up including CT and MRI revealed a 3 cm right upper lobe lung lesion andat least 3 brain metastases. PET/CT revealed hypermetabolic right upper lobe lung mass invading the mediastinum with associated right hilar, infrahilar, and subcarinal metastatic adenopathy. PET also revealed hypermetabolic epicardial lymph nodes, bilateral adrenal gland metastasis, and bone metastasis in the left femur. Patient is s/p XRT and on systemic chemotherapy.  She was referred to palliative care to address goals and manage ongoing symptoms.   Observations/Objective: PET scan on 12/29/2019 reveals overall disease progression with interval development of new left supraclavicular nodal metastasis and multifocal hepatic metastasis.   I met virtually with patient, two sons, and sister.  Together, we discussed the results of the PET scan.  Patient has been declining with overall worsening performance status over the past several weeks.  Her oral intake is minimal.  Unfortunately, patient is felt to lack viable treatment options at this point.  Additionally, it is highly likely that patient could not tolerate additional chemotherapy given her poor performance status.  Patient asked me to prognosticate and we discussed the possibility that she is nearing end-of-life.  We talked about  the option of hospice care.  She plans to speak with Dr. Grayland Ormond later this afternoon and will talk with family regarding her overall goals.  Assessment and Plan: Stage IV lung cancer with disease progression on carboplatin/paclitaxel.  Discussed option of hospice.  Patient meeting later this afternoon with Dr. Grayland Ormond  Follow Up Instructions: To be determined   I discussed the assessment and treatment plan with the patient. The patient was provided an opportunity to ask questions and all were answered. The patient agreed with the plan and demonstrated an understanding of the instructions.   The patient was advised to call back or seek an in-person evaluation if the symptoms worsen or if the condition fails to improve as anticipated.  I provided 20 minutes of non-face-to-face time during this encounter.   Irean Hong, NP

## 2019-12-31 NOTE — Telephone Encounter (Signed)
Case discussed with Dr. Grayland Ormond.  After meeting with patient and family they decided to pursue hospice.  I called in an order for hospice to [facility].

## 2019-12-31 NOTE — Progress Notes (Signed)
Patients family requested visit to be switched to virtual today due to continued decline in patients condition. Patient had PET scan this week, has worsening weakness and requesting Hospice services at this time.

## 2020-01-01 ENCOUNTER — Other Ambulatory Visit: Payer: Self-pay | Admitting: Oncology

## 2020-01-01 NOTE — Telephone Encounter (Signed)
Notes  Ref Range & Units 8 d ago 2 wk ago  Potassium 3.5 - 5.1 mmol/L 4.4  3.6

## 2020-01-04 ENCOUNTER — Inpatient Hospital Stay: Payer: Managed Care, Other (non HMO)

## 2020-01-04 ENCOUNTER — Inpatient Hospital Stay: Payer: Managed Care, Other (non HMO) | Admitting: Hospice and Palliative Medicine

## 2020-01-04 ENCOUNTER — Ambulatory Visit: Payer: Managed Care, Other (non HMO) | Admitting: Oncology

## 2020-01-05 ENCOUNTER — Other Ambulatory Visit: Payer: Self-pay | Admitting: Hospice and Palliative Medicine

## 2020-01-05 ENCOUNTER — Telehealth: Payer: Self-pay | Admitting: *Deleted

## 2020-01-05 MED ORDER — MORPHINE SULFATE ER 15 MG PO TBCR: 15.0000 mg | EXTENDED_RELEASE_TABLET | Freq: Two times a day (BID) | ORAL | 0 refills | Status: DC

## 2020-01-05 NOTE — Telephone Encounter (Signed)
Call from [facility] stating that patient told her she is no longer taking treatment and she is asking for order to discontinue Fufulia injections.

## 2020-01-05 NOTE — Telephone Encounter (Signed)
Call returned to [facility], patient does not need injections any longer. [facility] to fax discharge orders to be signed.

## 2020-01-05 NOTE — Progress Notes (Signed)
I spoke with patient's hospice nurse who requested refill of MS Contin.

## 2020-01-11 ENCOUNTER — Other Ambulatory Visit: Payer: Self-pay | Admitting: *Deleted

## 2020-01-11 MED ORDER — HYDROCODONE-ACETAMINOPHEN 7.5-325 MG PO TABS: 1.0000 | ORAL_TABLET | ORAL | 0 refills | Status: DC | PRN

## 2020-01-12 ENCOUNTER — Other Ambulatory Visit: Payer: Self-pay | Admitting: Hospice and Palliative Medicine

## 2020-01-12 MED ORDER — HYDROCODONE-ACETAMINOPHEN 7.5-325 MG PO TABS: 1.0000 | ORAL_TABLET | ORAL | 0 refills | Status: DC | PRN

## 2020-01-12 NOTE — Progress Notes (Signed)
Refill requested for Norco from hospice.

## 2020-01-20 ENCOUNTER — Other Ambulatory Visit: Payer: Self-pay | Admitting: Oncology

## 2020-01-26 ENCOUNTER — Other Ambulatory Visit: Payer: Self-pay | Admitting: Oncology

## 2020-02-04 ENCOUNTER — Other Ambulatory Visit: Payer: Self-pay | Admitting: Hospice and Palliative Medicine

## 2020-02-04 MED ORDER — MORPHINE SULFATE ER 15 MG PO TBCR: 15.0000 mg | EXTENDED_RELEASE_TABLET | Freq: Two times a day (BID) | ORAL | 0 refills | Status: DC

## 2020-02-04 NOTE — Progress Notes (Signed)
Refill requested for MS Contin by hospice. Will send.

## 2020-03-03 ENCOUNTER — Other Ambulatory Visit: Payer: Self-pay | Admitting: Hospice and Palliative Medicine

## 2020-03-03 MED ORDER — MORPHINE SULFATE ER 15 MG PO TBCR: 15.0000 mg | EXTENDED_RELEASE_TABLET | Freq: Two times a day (BID) | ORAL | 0 refills | Status: AC

## 2020-03-03 NOTE — Progress Notes (Signed)
Hospice requested refill of MS Contin

## 2020-03-04 ENCOUNTER — Telehealth: Payer: Self-pay | Admitting: *Deleted

## 2020-03-04 NOTE — Telephone Encounter (Signed)
Sandra Leonard called stating that his FMLA needs to be updated and that the current one has expired. Please return his call

## 2020-03-10 ENCOUNTER — Other Ambulatory Visit: Payer: Self-pay | Admitting: Hospice and Palliative Medicine

## 2020-03-10 MED ORDER — HYDROCODONE-ACETAMINOPHEN 7.5-325 MG PO TABS: 1.0000 | ORAL_TABLET | ORAL | 0 refills | Status: AC | PRN

## 2020-03-10 NOTE — Progress Notes (Signed)
Hospice nurse requested a refill on Norco. Will send to pharmacy. #[phone]. PDMP reviewed.

## 2020-03-25 ENCOUNTER — Telehealth: Payer: Self-pay | Admitting: Hospice and Palliative Medicine

## 2020-03-25 NOTE — Telephone Encounter (Signed)
I received a call from patient's hospice nurse. Patient has 24-48 hours of productive cough with green sputum some mild shortness of breath. No fever or chills. No known sick contact. Family is asking for abx. Okay to start azithromycin 500mg  x 1 day and then 250mg  x 4 days. Okay for mucinex and duonebs. Would recommend COVID testing.

## 2020-03-29 DEATH — deceased

## 2020-03-31 ENCOUNTER — Telehealth: Payer: Self-pay | Admitting: *Deleted

## 2020-03-31 NOTE — Telephone Encounter (Signed)
Faxed

## 2020-03-31 NOTE — Telephone Encounter (Signed)
[facility] requests that signed death certificate be sent to them.

## 2020-03-31 NOTE — Telephone Encounter (Signed)
Signed.  On my desk.

## 2020-05-17 ENCOUNTER — Other Ambulatory Visit: Payer: Self-pay | Admitting: Oncology

## 2020-06-29 ENCOUNTER — Telehealth: Payer: Self-pay | Admitting: Pulmonary Disease

## 2020-06-29 NOTE — Telephone Encounter (Signed)
Received medical record request from [facility]. Last OV note has been faxed as requested.  Nothing further needed at this time.

## 2021-09-12 IMAGING — CT CT ANGIO CHEST
2 of 6 series · 16 of 46 positions shown · IV contrast (APPLIED)
Comparison: Chest radiograph 11/24/2019, PET-CT 09/07/2019, CT
chest 08/27/2019

CLINICAL DATA: Sharp right rib pain post chemotherapy 11/23/2019,
shortness of breath

EXAM:
CT ANGIOGRAPHY CHEST WITH CONTRAST
TECHNIQUE: Multidetector CT imaging of the chest was performed using the
standard protocol during bolus administration of intravenous
contrast. Multiplanar CT image reconstructions and MIPs were
obtained to evaluate the vascular anatomy.
CONTRAST:  75mL OMNIPAQUE IOHEXOL 350 MG/ML SOLN

[Series 5: thins · axial · 0.68mm/px · z∈[-65,+155]mm · 13 of 242 slices shown]
[im 11/242  lung]
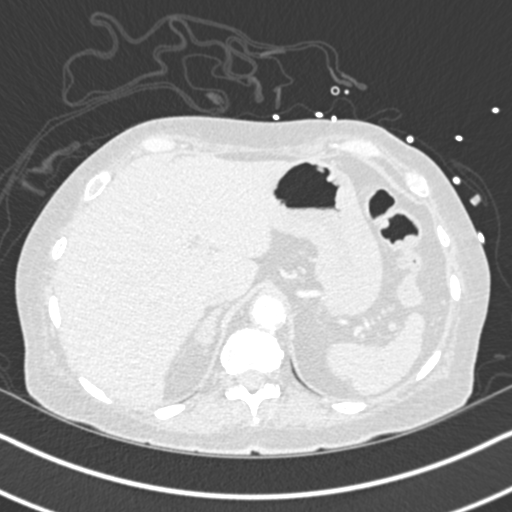
[im 32/242  soft-tissue]
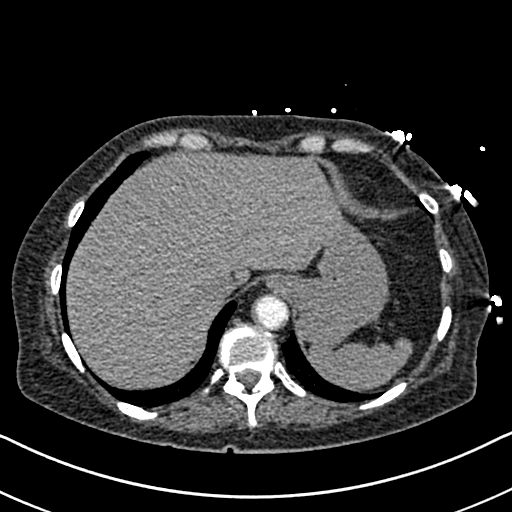
[im 53/242  lung]
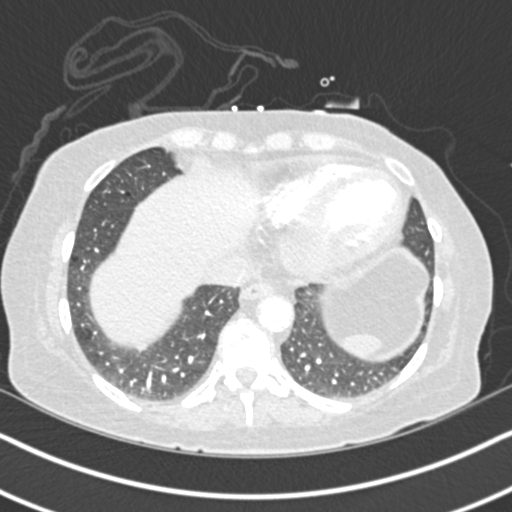
[im 63/242  soft-tissue]
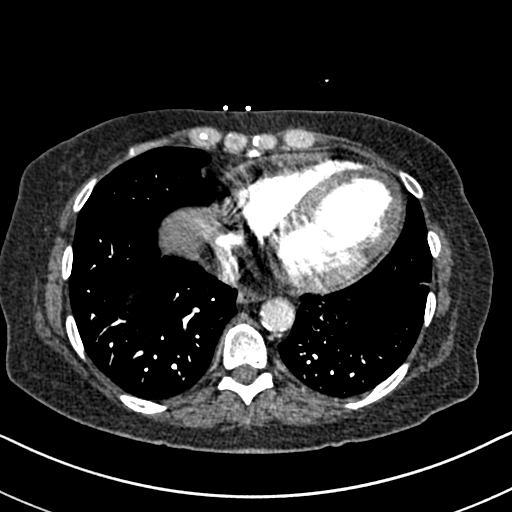
[im 84/242  lung]
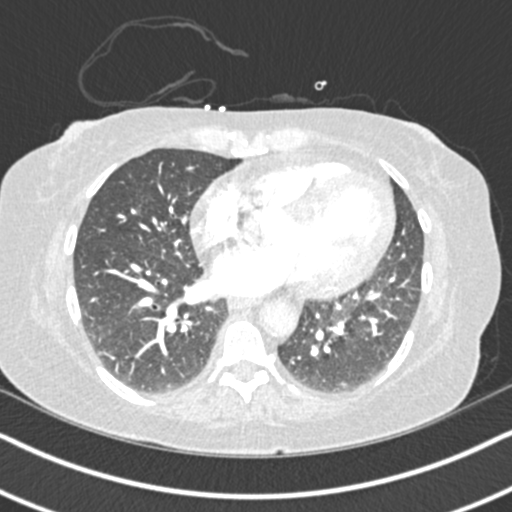
[im 105/242  soft-tissue]
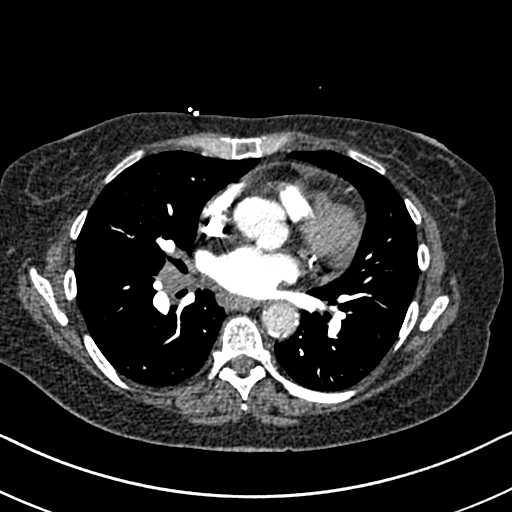
[im 126/242  lung]
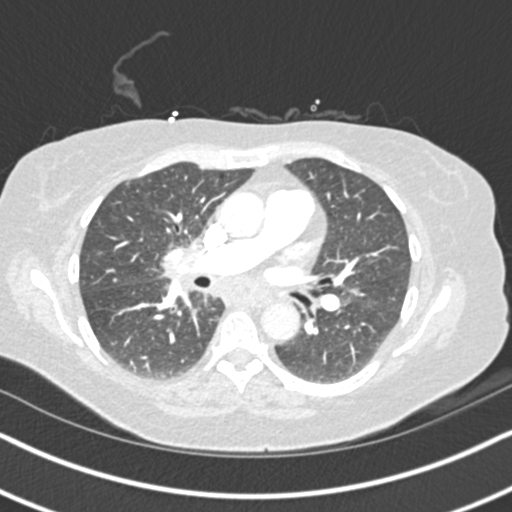
[im 137/242  soft-tissue]
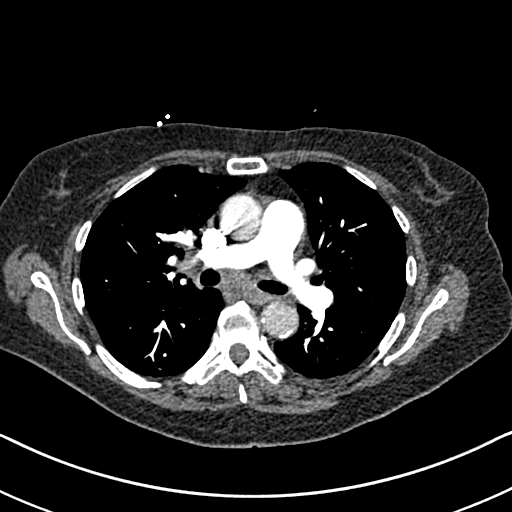
[im 158/242  lung]
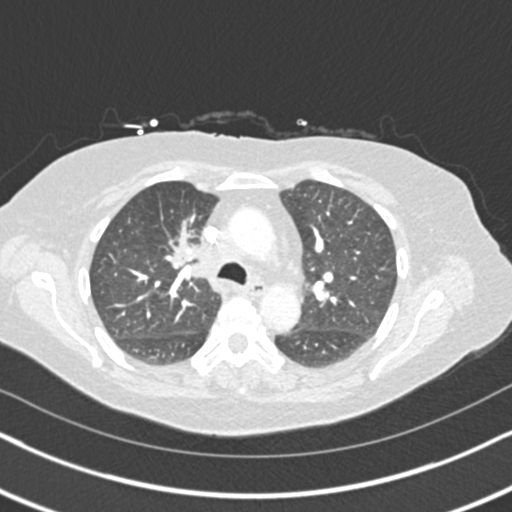
[im 179/242  soft-tissue]
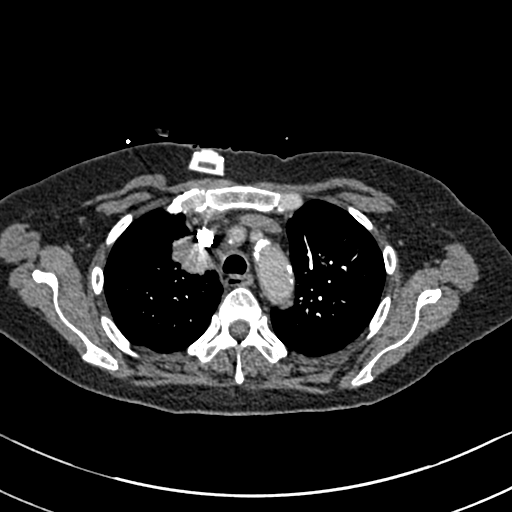
[im 189/242  lung]
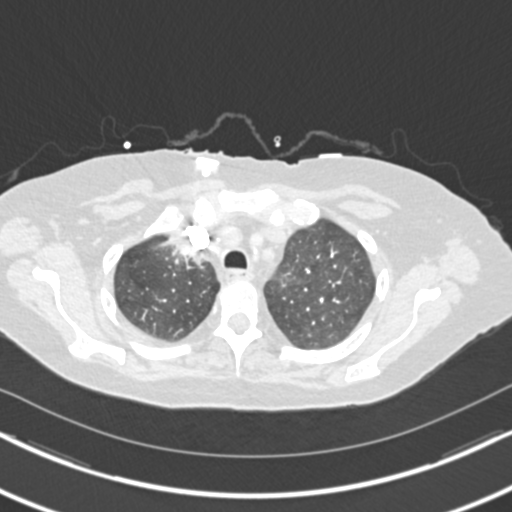
[im 210/242  soft-tissue]
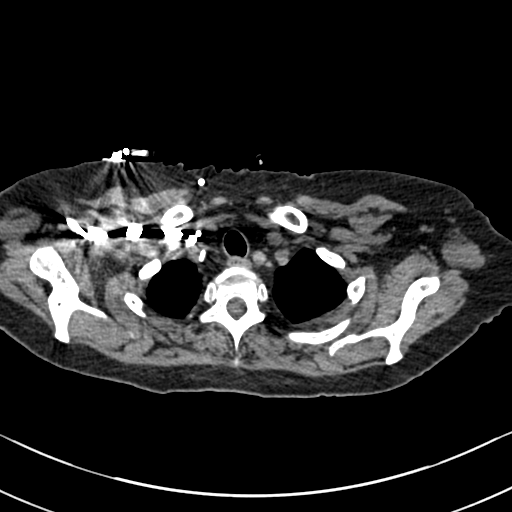
[im 231/242  lung]
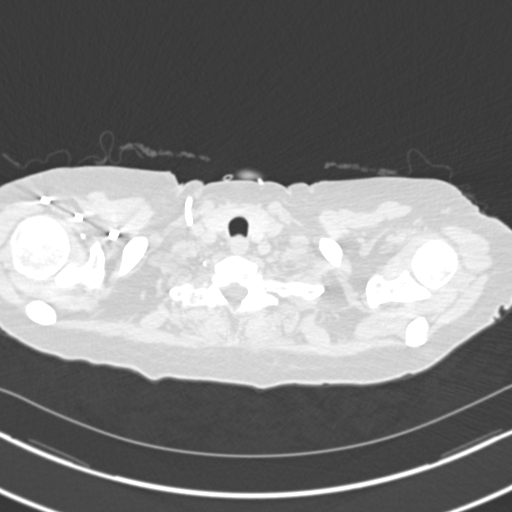

[Series 7: coronal mpr · coronal · 0.49mm/px · 3 of 78 slices shown]
[im 20/78  soft-tissue]
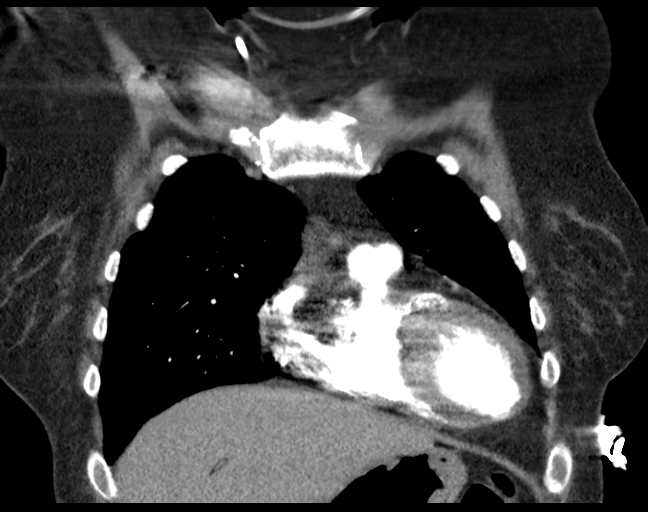
[im 39/78  soft-tissue]
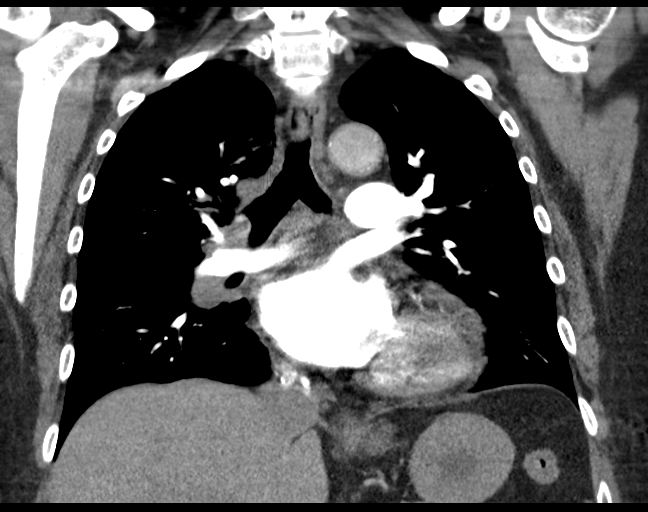
[im 58/78  soft-tissue]
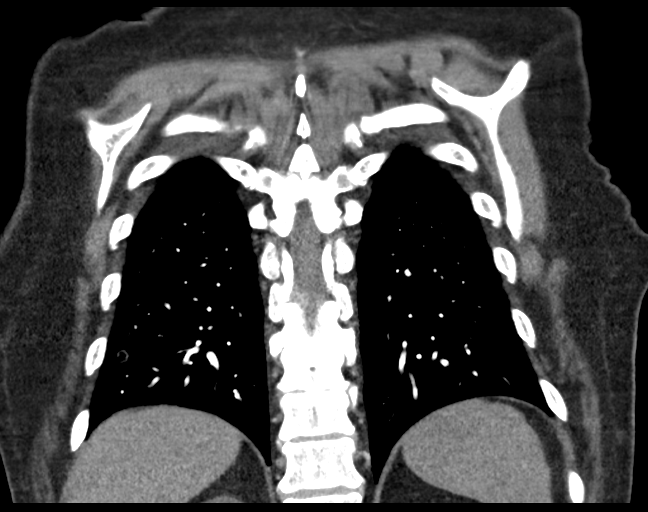

[16 of 46 positions shown; findings below may reference images not displayed]

FINDINGS: Cardiovascular: Satisfactory opacification the pulmonary arteries to
the segmental level. No pulmonary artery filling defects are
identified. Central pulmonary arteries are normal caliber.
Suboptimal opacification of the thoracic aorta. Atherosclerotic
plaque within the normal caliber aorta. No gross acute luminal
abnormality is seen. No periaortic stranding or hemorrhage. Normal 3
vessel branching of the aortic arch. Proximal great vessels are
unremarkable. Early branching of the left vertebral artery with
minimal plaque at the ostium. Remaining proximal great vessels are
moderately calcified as well. There is some mild narrowing of the
SVC in the vicinity of the right hilar/paramediastinal mass which
could reflect some compression or possible narrowing secondary to a
right internal jugular approach Port-A-Cath, the tip positioned at
the superior cavoatrial junction. Normal cardiac size. Scattered
coronary artery calcifications. Trace pericardial fluid is present,
increasing from comparison.

Mediastinum/Nodes: Redemonstration of the infiltrative right upper
lobe lung lesion extending into the mediastinum both along the right
paramediastinal border and into the subcarinal space. Increasing
size of the subcarinal nodal deposit from comparison 08/27/2019 and
subsequent PET-CT 09/07/2019 with this measuring up to 2.1 cm in
short axis, previously 17 mm (4/40). Increasing right hilar nodal
deposit as well measuring up to 19 mm short axis, previously 13 mm
(4/37). Few enlarged epicardial lymph nodes are also present and
enlarged from prior now measuring up to 11 mm (4/63). Some mild
narrowing of the bronchus intermedius and hilar airways in the
vicinity of the infiltrative right hilar mass as well as occlusion
of the apical segmental airways and anterior segmental airways of
the right upper lobe. Loss of discernible fat plane between the
right lateral margin of the esophagus and the adjacent subcarinal
nodal disease. Thyroid gland and thoracic inlet are unremarkable.

Lungs/Pleura: Redemonstration of the spiculated right upper lobe
mass along the paramediastinal margins measuring up to 2.8 x 2.7 cm,
previously 2.5 x 2.5 cm at a similar level. No other concerning
pulmonary nodules or masses. Few scattered calcified granulomata.
Some hypoventilatory changes in the lungs are likely accentuated by
imaging during exhalation. No consolidation, features of edema,
pneumothorax, or effusion.

Upper Abdomen: Bilateral nodular thickening of the adrenal glands
previously demonstrating hypermetabolism on PET CT and compatible
with adrenal metastases, perhaps mildly increased from comparison
PET-CT measuring up to 16 mm on the right, previously 11 mm at a
similar level. Left adrenal gland is incompletely visualized. No
other acute or suspicious abnormality seen in the upper abdomen.

Musculoskeletal: Multilevel degenerative changes are present in the
imaged portions of the spine. No worrisome lytic or blastic lesions.
No worrisome chest wall lesions.

Review of the MIP images confirms the above findings.
IMPRESSION: 1. No evidence of acute pulmonary embolism.
2. Increase in the size of the spiculated right upper lobe mass
along the paramediastinal margins, with worsening right hilar,
mediastinal and epicardial adenopathy.
3. Slight interval increase in the size of the previously
hypermetabolic bilateral adrenal metastases.
4. Narrowing of the right upper lobe hilar airways encased by the
paramediastinal extension of primary lesion.
5. Additional narrowing of the SVC in the vicinity of the right
hilar/paramediastinal mass which could reflect some compression,
invasion or possible narrowing secondary fibrosis from a right
internal jugular approach Port-A-Cath, the tip positioned at the
superior cavoatrial junction.
6. Aortic Atherosclerosis ([id number]).
